# Patient Record
Sex: Male | Born: 1965 | Race: White | Hispanic: No | Marital: Married | Smoking: Former smoker
Health system: Southern US, Community
[De-identification: ages and names within clinical notes are randomized; demographics above are authoritative.]

## PROBLEM LIST (undated history)

## (undated) DIAGNOSIS — N489 Disorder of penis, unspecified: Secondary | ICD-10-CM

## (undated) DIAGNOSIS — J449 Chronic obstructive pulmonary disease, unspecified: Secondary | ICD-10-CM

## (undated) DIAGNOSIS — I10 Essential (primary) hypertension: Secondary | ICD-10-CM

## (undated) DIAGNOSIS — M1990 Unspecified osteoarthritis, unspecified site: Secondary | ICD-10-CM

## (undated) HISTORY — PX: CYST EXCISION: SHX5701

## (undated) HISTORY — PX: HAND SURGERY: SHX662

---

## 2000-07-13 ENCOUNTER — Emergency Department (HOSPITAL_COMMUNITY): Admission: EM | Admit: 2000-07-13 | Discharge: 2000-07-13 | Payer: Self-pay | Admitting: Emergency Medicine

## 2000-11-10 ENCOUNTER — Emergency Department (HOSPITAL_COMMUNITY): Admission: EM | Admit: 2000-11-10 | Discharge: 2000-11-10 | Payer: Self-pay | Admitting: Emergency Medicine

## 2000-11-10 ENCOUNTER — Encounter: Payer: Self-pay | Admitting: Emergency Medicine

## 2000-11-15 ENCOUNTER — Encounter: Admission: RE | Admit: 2000-11-15 | Discharge: 2000-11-15 | Payer: Self-pay | Admitting: Sports Medicine

## 2000-11-15 ENCOUNTER — Encounter: Payer: Self-pay | Admitting: Sports Medicine

## 2001-05-15 ENCOUNTER — Emergency Department (HOSPITAL_COMMUNITY): Admission: EM | Admit: 2001-05-15 | Discharge: 2001-05-15 | Payer: Self-pay | Admitting: Emergency Medicine

## 2001-05-15 ENCOUNTER — Encounter: Payer: Self-pay | Admitting: *Deleted

## 2001-12-23 ENCOUNTER — Emergency Department (HOSPITAL_COMMUNITY): Admission: EM | Admit: 2001-12-23 | Discharge: 2001-12-23 | Payer: Self-pay | Admitting: Emergency Medicine

## 2002-05-19 ENCOUNTER — Emergency Department (HOSPITAL_COMMUNITY): Admission: EM | Admit: 2002-05-19 | Discharge: 2002-05-19 | Payer: Self-pay | Admitting: Emergency Medicine

## 2002-07-28 ENCOUNTER — Encounter: Payer: Self-pay | Admitting: Emergency Medicine

## 2002-07-28 ENCOUNTER — Emergency Department (HOSPITAL_COMMUNITY): Admission: EM | Admit: 2002-07-28 | Discharge: 2002-07-28 | Payer: Self-pay | Admitting: Emergency Medicine

## 2002-11-24 ENCOUNTER — Emergency Department (HOSPITAL_COMMUNITY): Admission: EM | Admit: 2002-11-24 | Discharge: 2002-11-24 | Payer: Self-pay | Admitting: Emergency Medicine

## 2002-11-24 ENCOUNTER — Encounter: Payer: Self-pay | Admitting: Emergency Medicine

## 2002-12-31 ENCOUNTER — Observation Stay (HOSPITAL_COMMUNITY): Admission: RE | Admit: 2002-12-31 | Discharge: 2003-01-01 | Payer: Self-pay | Admitting: Orthopaedic Surgery

## 2002-12-31 HISTORY — PX: ANTERIOR CERVICAL DECOMP/DISCECTOMY FUSION: SHX1161

## 2005-02-26 ENCOUNTER — Ambulatory Visit (HOSPITAL_COMMUNITY): Admission: RE | Admit: 2005-02-26 | Discharge: 2005-02-27 | Payer: Self-pay | Admitting: Orthopaedic Surgery

## 2005-02-26 HISTORY — PX: POSTERIOR FUSION CERVICAL SPINE: SUR628

## 2005-06-05 ENCOUNTER — Emergency Department (HOSPITAL_COMMUNITY): Admission: EM | Admit: 2005-06-05 | Discharge: 2005-06-05 | Payer: Self-pay | Admitting: Emergency Medicine

## 2005-12-14 ENCOUNTER — Emergency Department (HOSPITAL_COMMUNITY): Admission: EM | Admit: 2005-12-14 | Discharge: 2005-12-14 | Payer: Self-pay | Admitting: Family Medicine

## 2007-02-01 ENCOUNTER — Emergency Department (HOSPITAL_COMMUNITY): Admission: EM | Admit: 2007-02-01 | Discharge: 2007-02-01 | Payer: Self-pay | Admitting: Emergency Medicine

## 2007-03-17 ENCOUNTER — Emergency Department (HOSPITAL_COMMUNITY): Admission: EM | Admit: 2007-03-17 | Discharge: 2007-03-17 | Payer: Self-pay | Admitting: Family Medicine

## 2007-05-16 ENCOUNTER — Emergency Department (HOSPITAL_COMMUNITY): Admission: EM | Admit: 2007-05-16 | Discharge: 2007-05-16 | Payer: Self-pay | Admitting: Emergency Medicine

## 2007-08-05 ENCOUNTER — Emergency Department (HOSPITAL_COMMUNITY): Admission: EM | Admit: 2007-08-05 | Discharge: 2007-08-05 | Payer: Self-pay | Admitting: Emergency Medicine

## 2009-02-09 ENCOUNTER — Emergency Department (HOSPITAL_COMMUNITY): Admission: EM | Admit: 2009-02-09 | Discharge: 2009-02-09 | Payer: Self-pay | Admitting: Emergency Medicine

## 2010-03-02 ENCOUNTER — Emergency Department (HOSPITAL_COMMUNITY)
Admission: EM | Admit: 2010-03-02 | Discharge: 2010-03-02 | Payer: Self-pay | Source: Home / Self Care | Admitting: Family Medicine

## 2010-08-27 LAB — POCT I-STAT, CHEM 8
BUN: 17 mg/dL (ref 6–23)
Calcium, Ion: 1.11 mmol/L — ABNORMAL LOW (ref 1.12–1.32)
Chloride: 103 mEq/L (ref 96–112)
Creatinine, Ser: 1 mg/dL (ref 0.4–1.5)
Glucose, Bld: 94 mg/dL (ref 70–99)
HCT: 47 % (ref 39.0–52.0)
Hemoglobin: 16 g/dL (ref 13.0–17.0)
Potassium: 3.7 mEq/L (ref 3.5–5.1)
Sodium: 139 mEq/L (ref 135–145)
TCO2: 26 mmol/L (ref 0–100)

## 2010-08-27 LAB — DIFFERENTIAL
Basophils Absolute: 0 10*3/uL (ref 0.0–0.1)
Basophils Relative: 0 % (ref 0–1)
Eosinophils Absolute: 0.1 10*3/uL (ref 0.0–0.7)
Eosinophils Relative: 1 % (ref 0–5)
Lymphocytes Relative: 11 % — ABNORMAL LOW (ref 12–46)
Lymphs Abs: 0.9 10*3/uL (ref 0.7–4.0)
Monocytes Absolute: 0.7 10*3/uL (ref 0.1–1.0)
Monocytes Relative: 9 % (ref 3–12)
Neutro Abs: 6.3 10*3/uL (ref 1.7–7.7)
Neutrophils Relative %: 79 % — ABNORMAL HIGH (ref 43–77)

## 2010-08-27 LAB — CBC
HCT: 43.5 % (ref 39.0–52.0)
Hemoglobin: 15 g/dL (ref 13.0–17.0)
MCH: 30.7 pg (ref 26.0–34.0)
MCHC: 34.5 g/dL (ref 30.0–36.0)
MCV: 89 fL (ref 78.0–100.0)
Platelets: 252 10*3/uL (ref 150–400)
RBC: 4.89 MIL/uL (ref 4.22–5.81)
RDW: 12.7 % (ref 11.5–15.5)
WBC: 8 10*3/uL (ref 4.0–10.5)

## 2010-09-19 LAB — POCT RAPID STREP A (OFFICE): Streptococcus, Group A Screen (Direct): NEGATIVE

## 2010-10-30 NOTE — Op Note (Signed)
NAME:  HAYS, DUNNIGAN                         ACCOUNT NO.:  [id number]   MEDICAL RECORD NO.:  [id number]                   PATIENT TYPE:  INP   LOCATION:  5005                                 FACILITY:  [facility]   PHYSICIAN:  Mark C. Ophelia Charter, M.D.                 DATE OF BIRTH:  June 03, 1966   DATE OF PROCEDURE:  12/31/2002  DATE OF DISCHARGE:                                 OPERATIVE REPORT   PREOPERATIVE DIAGNOSIS:  Cervical spondylosis with left C5-6 paracentral HNP  and right C6-7 paracentral HNP with cord compression.   PROCEDURE:  C5-6, C6-7 anterior cervical diskectomy and fusion, allograft  cortical ring with anterior Synthes plating.   SURGEON:  Mark C. Ophelia Charter, M.D.   ASSISTANT:  Genene Churn. Denton Meek.   ANESTHESIA:  GOT.   ESTIMATED BLOOD LOSS:  Less than 100 mL.   DRAINS:  One Hemovac in the neck.   DESCRIPTION OF PROCEDURE:  After induction of general anesthesia,  orotracheal intubation, preoperative Ancef prophylaxis, prepping with  Duraprep, the area was squared with towels.  A sterile skin marker was used  in line with skin fold at the appropriate level and Betadine and Vi-drape.  A sterile Mayo stand at the head.  Thyroid sheet and half sheet at the top.  Incision was made starting in the midline and extending to the left using  the correct thyroid cartilage and carotid tubercle for palpation.  Platysma  was elevated above and below.  Due to the plan, C5-6 and C6-7 level, the  omohyoid muscle was split and divided.  Soft tissue was cleaned directly off  the anterior midline and the longus coli muscles were elevated.  Needle  localization of the 4-5 level was performed with the C-arm draped into the  field as it was placed underneath in a reversed rainbow position. The needle  was confirmed at 4-5.  The next level down was 5-6 with anterior spurring.  Diskectomy was performed with a scalpel blade and pituitary.  Cloward  retractors were used to remove tissue in both  gutters.  Operative microscope  was draped and brought in. The posterior longitudinal ligament was taken  down and disk material was removed which was central and to the left side  which was causing significant compression.  The foraminal was enlarged with  1 to 2 mm Kerrisons.  The dura was directly visualized and decompressed.  Some pieces of disk were teased out with the black nerve hook and ball-tip  nerve hook and removed.  Using the round and pear-shaped bur, endplates were  undercut leaving a small lip anteriorly and one posteriorly to prevent  posterior migration of the plug.  Trial sizes were performed and 6 gave an  excellent fit.  A 43 mm Synthes gold plate was selected and checked under  fluoroscopy as well as direct visualization.  Once the procedure had been  repeated at  C6-7 taking down the disk, curetting the gutters with Cloward  curets sequentially up to 0, bringing in the operative microscope, taking  down the posterior longitudinal ligament and teasing disk fragments out of  the right side gutter with decompression and exposure of the dura all the  way across the back, sizers were used and again a 6 mm graft was selected  for the C6-7 level.  A 6 mm graft again was selected.  A single plateholder  pin was placed and fluoroscopy was used to confirm appropriate placement.  Screws were placed top right and bottom left followed by bottom left and top  right and then center two screws were filled using 14 mm screws.  The entire  apparatus checked with spot fluoroscopy pictures and then the locking screws  were inserted.  Care was taken to gently protect the esophagus during screw  placement and during the hand drilling.  Self-retaining Cloward retractors  were removed after irrigation of the wound. A large towel was placed over  the incision and the fluoroscopy slung up for a single AP x-ray of the neck  which showed good position. A Hemovac drain was placed in line with the  skin  incision through a separate stab incision.  The platysma was closed with 3-0  Vicryl, 4-0 Vicryl, and subcuticular closure.  Tincture of Benzoin and Steri-  Strips, Xeroform, 4x4's, tape, and soft collar was applied.  Needle, sponge,  and instrument count correct.                                               Mark C. Ophelia Charter, M.D.    MCY/MEDQ  D:  12/31/2002  T:  01/01/2003  Job:  [id number]

## 2010-10-30 NOTE — Op Note (Signed)
NAMEZACH, Mike Watkins               ACCOUNT NO.:  [id number]   MEDICAL RECORD NO.:  [id number]          PATIENT TYPE:  INP   LOCATION:  2852                         FACILITY:  [facility]   PHYSICIAN:  Mark C. Ophelia Charter, M.D.    DATE OF BIRTH:  07/06/1965   DATE OF PROCEDURE:  02/26/2005  DATE OF DISCHARGE:                                 OPERATIVE REPORT   PREOPERATIVE DIAGNOSES:  1.  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   POSTOPERATIVE DIAGNOSES:  1.  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  2.  Chronic right lateral epicondylitis.   PROCEDURE:  C5 to C7 posterior cervical fusion, iliac crest bone graft  wiring and Symphony allograft.  Right lateral epicondylar injection.   SURGEON:  Mark C. Ophelia Charter, M.D.   ASSISTANT:  Patrick Jupiter, RNFA.   ANESTHESIA:  General anesthesia plus Marcaine local.   ESTIMATED BLOOD LOSS:  200 mL.   DRAINS:  One Hemovac right posterior iliac crest.   BRIEF HISTORY:  A 45 year old male who has had previous cervical fusion on  December 31, 2002, did well for about nine months and then started having  increasing aching pain and flexion/extension x-ray showed some motion at the  upper level with lucency around the screw at C5 and also at C6 vertebral  body with apparent solid fusion at C6-7.  MRI showed no evidence of residual  compression as well as no compression above or below the fusion site. Due to  his persistent symptoms, he was admitted for posterior fusion for the  pseudoarthrosis.   DESCRIPTION OF PROCEDURE:  After induction of general anesthesia, patient  was placed prone.  There were some problems with the CO2 monitor which  requiring replacing some of the sets which delayed positioning prone and  prepping.  Once this was taken care of and he was adjusted on horseshoe head  holder to make sure there was no pressure, he was placed in reverse  Trendelenburg with good blood pressure.  The occiput area of the head about  an inch and  a half was shaved and area was squared with 10/10 drapes after  Tincture of Benzoin as well as right posterior iliac crest.   The area was squared with towels, sterile skin marker used on the neck at  the midline and Betadine Vi-Drape application x2 sterilely administered at  the head and thyroid sheet.  Incision was made at the midline in the neck  from about C4 to C7.  Bifid spinous process identified at C5 and clamp was  placed between C4-5 spinous process and C5-6.  C4-5 showed obvious motion  with Kocher clamp on each spinous process.  C5-6 showed trace motion,  initially was unsure there was motion and then as we continued lateral  exposure, it was obvious that there was a few millimeters of motion.  At the  bottom level surprisingly there was also motion, although it looked solid  and did not move on flexion/extension x-rays.  Motion was less perceptible  at the bottom level.  With cross table lateral x-ray confirming that the  clamps  were between C4-5 an C5-6 spinous process, upper clamp was removed.  The interspinous ligament was removed.  Bone was debrided with a bur out to  the facets.  The 5, 6 and 7 lamina was prepared by removing portions of the  cortical bone to a good bleeding bed without penetrating all the way through  the lamina.  Right iliac crest graft was exposed and a cortical cancellous  piece was fashioned and then made into an H and then stuck in between  spinous process and then pressed down with slight cracking as it layed down  against the inferior aspect of the lamina that had been prepared.  Identical  procedure was repeated at C6-7 level and then graft was harvested with the  mixed with the Symphony with 10 mL iliac crest with blood that had been  drawn preoperatively 300 mL for preparation.  Log was cut in half.  A 24-  gauge wire, triple strand, was twisted into a cable.  A notch was cut at the  top of C5, the edges were smoothed and it was passed at the  base of C7 up  around through the notch at C5, then tightened down with H grafts being held  by the wire.  Log that had been cut in half was packed on the right and left  side making sure that bone went down to the level of the facets laterally  and also stayed between C5 and C7 on the lamina and also over the top of the  H grafts.  Operative field was dry.  Some FloSeal had been used prior to  denuding and some muscle bleeders were coagulated.  Deep fascia in the  midline was closed with 0 Vicryl, 2-0 Vicryl  in the subcutaneous tissue.  Operative field was dry.  No drain was needed in the neck.  In the iliac  crest, there was some bleeding from the bone oozing and FloSeal was squirted  in. Hemovac was placed through separate stab incision with in and out  technique and the deep fascia closed with 0 Vicryl, 2-0 Vicryl subcutaneous  tissue, 3-0 Vicryl subcuticular skin closure, Tincture of Benzoin, Steri-  Strips and Marcaine infiltration.  In the neck, 2-0 Vicryl followed by 4-0  Vicryl subcuticular closure was used.  Tincture of Benzoin and Steri-Strips  and Marcaine infiltration. Postoperative dressing, soft cervical collar.  With patient still prone, the right arm was brought out from the drape,  prepped on the lateral epicondyle and then injected with combination of  cortisone and Marcaine 1 mL of each into the lateral epicondyle into the  tendon insertion site.  This is at least the patient's fourth or fifth  injection over several years for his chronic problem.  The 4x4 was placed  with a piece of tape post injection and then patient was transferred to the  supine position, soft collar was placed, then transferred to the recovery  room in stable condition.  Instrument count and needle count correct.  No  packs were used during the case.      Mark C. Ophelia Charter, M.D.  Electronically Signed    MCY/MEDQ  D:  02/26/2005  T:  02/26/2005  Job:  [id number]

## 2011-03-05 LAB — URINALYSIS, ROUTINE W REFLEX MICROSCOPIC
Bilirubin Urine: NEGATIVE
Glucose, UA: NEGATIVE
Hgb urine dipstick: NEGATIVE
Ketones, ur: NEGATIVE
Nitrite: NEGATIVE
Protein, ur: NEGATIVE
Specific Gravity, Urine: 1.028
Urobilinogen, UA: 0.2
pH: 6

## 2011-03-05 LAB — URINE CULTURE
Colony Count: NO GROWTH
Culture: NO GROWTH

## 2011-03-22 LAB — INFLUENZA A AND B ANTIGEN (CONVERTED LAB)
Inflenza A Ag: NEGATIVE
Influenza B Ag: NEGATIVE

## 2011-03-26 LAB — OCCULT BLOOD X 1 CARD TO LAB, STOOL: Fecal Occult Bld: POSITIVE

## 2011-06-20 ENCOUNTER — Emergency Department (INDEPENDENT_AMBULATORY_CARE_PROVIDER_SITE_OTHER): Payer: BC Managed Care – PPO

## 2011-06-20 ENCOUNTER — Encounter: Payer: Self-pay | Admitting: *Deleted

## 2011-06-20 ENCOUNTER — Emergency Department (INDEPENDENT_AMBULATORY_CARE_PROVIDER_SITE_OTHER)
Admission: EM | Admit: 2011-06-20 | Discharge: 2011-06-20 | Disposition: A | Discharge status: Home / Self Care | Payer: BC Managed Care – PPO | Source: Home / Self Care | Attending: Family Medicine | Admitting: Family Medicine

## 2011-06-20 DIAGNOSIS — S60229A Contusion of unspecified hand, initial encounter: Secondary | ICD-10-CM

## 2011-06-20 DIAGNOSIS — S60221A Contusion of right hand, initial encounter: Secondary | ICD-10-CM

## 2011-06-20 MED ORDER — DICLOFENAC POTASSIUM 50 MG PO TABS: 50.0000 mg | ORAL_TABLET | Freq: Three times a day (TID) | ORAL | Status: AC

## 2011-06-20 NOTE — ED Provider Notes (Signed)
History     CSN: [id number]  Arrival date & time 06/20/11  1249   First MD Initiated Contact with Patient 06/20/11 1306      Chief Complaint  Patient presents with  . Hand Injury    (Consider location/radiation/quality/duration/timing/severity/associated sxs/prior treatment) Patient is a 46 y.o. male presenting with hand pain. The history is provided by the patient.  Hand Pain This is a new problem. The current episode started more than 2 days ago. The problem has not changed since onset.Associated symptoms comments: May have struck against machine sev days ago, but uncertain.. The symptoms are aggravated by bending.    Past Medical History  Diagnosis Date  . Meningitis     Past Surgical History  Procedure Date  . Cervical spine surgery     History reviewed. No pertinent family history.  History  Substance Use Topics  . Smoking status: Former Games developer  . Smokeless tobacco: Not on file  . Alcohol Use: Yes      Review of Systems  Constitutional: Negative.   Musculoskeletal: Positive for joint swelling.  Skin: Negative.   Neurological: Negative.     Allergies  Sulfa antibiotics  Home Medications   Current Outpatient Rx  Name Route Sig Dispense Refill  . IBUPROFEN 800 MG PO TABS Oral Take 800 mg by mouth every 8 (eight) hours as needed.      Marland Kitchen DICLOFENAC POTASSIUM 50 MG PO TABS Oral Take 1 tablet (50 mg total) by mouth 3 (three) times daily. 30 tablet 0    BP 141/88  Pulse 82  Temp(Src) 98.5 F (36.9 C) (Oral)  Resp 18  SpO2 95%  Physical Exam  Nursing note and vitals reviewed. Constitutional: He is oriented to person, place, and time. He appears well-developed and well-nourished.  Musculoskeletal: Normal range of motion. He exhibits tenderness.       Arms: Neurological: He is alert and oriented to person, place, and time.  Skin: Skin is warm and dry.    ED Course  Procedures (including critical care time)  Labs Reviewed - No data to display Dg  Hand Complete Right  06/20/2011  *RADIOLOGY REPORT*  Clinical Data: Right hand pain in the region of the second through fourth metacarpals due to repetitive motion.  RIGHT HAND - COMPLETE 3+ VIEW  Comparison: None.  Findings: Normal appearing bones and soft tissues.  IMPRESSION: Normal examination.  Original Report Authenticated By: Darrol Angel, M.D.     1. Contusion of hand, right       MDM  X-rays reviewed and report per radiologist.         Barkley Bruns, MD 06/20/11 [phone]

## 2011-06-20 NOTE — ED Notes (Signed)
Right hand pain middle finger onset approx 3 to 4 days unknown injury

## 2011-08-03 ENCOUNTER — Encounter (HOSPITAL_COMMUNITY): Payer: Self-pay | Admitting: *Deleted

## 2011-08-03 ENCOUNTER — Emergency Department (INDEPENDENT_AMBULATORY_CARE_PROVIDER_SITE_OTHER)
Admission: EM | Admit: 2011-08-03 | Discharge: 2011-08-03 | Disposition: A | Discharge status: Home Health | Payer: 59 | Source: Home / Self Care | Attending: Emergency Medicine | Admitting: Emergency Medicine

## 2011-08-03 DIAGNOSIS — J111 Influenza due to unidentified influenza virus with other respiratory manifestations: Secondary | ICD-10-CM

## 2011-08-03 DIAGNOSIS — R6889 Other general symptoms and signs: Secondary | ICD-10-CM

## 2011-08-03 MED ORDER — GUAIFENESIN-CODEINE 100-10 MG/5ML PO SYRP: 5.0000 mL | ORAL_SOLUTION | Freq: Three times a day (TID) | ORAL | Status: AC | PRN

## 2011-08-03 MED ORDER — OSELTAMIVIR PHOSPHATE 75 MG PO CAPS: 75.0000 mg | ORAL_CAPSULE | Freq: Two times a day (BID) | ORAL | Status: AC

## 2011-08-03 NOTE — ED Provider Notes (Signed)
History     CSN: [id number]  Arrival date & time 08/03/11  1245   First MD Initiated Contact with Patient 08/03/11 1417      Chief Complaint  Patient presents with  . Generalized Body Aches    (Consider location/radiation/quality/duration/timing/severity/associated sxs/prior treatment) HPI Comments: Patient presents with upper respiratory symptoms consistent of cough congestion runny nose, body aches, headaches for about 3 days. "Feels like I have the flu". 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 male presenting with cough. The history is provided by the patient.  Cough This is a new problem. The current episode started 2 days ago. The problem occurs constantly. The problem has been gradually worsening. The cough is productive of sputum. The maximum temperature recorded prior to his arrival was 100 to 100.9 F. Associated symptoms include chills, sweats, headaches, rhinorrhea, sore throat and myalgias. Pertinent negatives include no shortness of breath and no wheezing. He has tried nothing for the symptoms. The treatment provided no relief. His past medical history does not include bronchitis, pneumonia, COPD or asthma.    Past Medical History  Diagnosis Date  . Meningitis     Past Surgical History  Procedure Date  . Cervical spine surgery     History reviewed. No pertinent family history.  History  Substance Use Topics  . Smoking status: Former Games developer  . Smokeless tobacco: Not on file  . Alcohol Use: Yes      Review of Systems  Constitutional: Positive for fever, chills, appetite change and fatigue.  HENT: Positive for congestion, sore throat and rhinorrhea.   Respiratory: Positive for cough. Negative for shortness of breath and wheezing.   Musculoskeletal: Positive for myalgias.  Skin: Negative for rash.  Neurological: Positive for headaches.    Allergies  Sulfa antibiotics  Home Medications   Current Outpatient Rx    Name Route Sig Dispense Refill  . DICLOFENAC POTASSIUM 50 MG PO TABS Oral Take 1 tablet (50 mg total) by mouth 3 (three) times daily. 30 tablet 0  . GUAIFENESIN-CODEINE 100-10 MG/5ML PO SYRP Oral Take 5 mLs by mouth 3 (three) times daily as needed for cough. 120 mL 0  . IBUPROFEN 800 MG PO TABS Oral Take 800 mg by mouth every 8 (eight) hours as needed.      . OSELTAMIVIR PHOSPHATE 75 MG PO CAPS Oral Take 1 capsule (75 mg total) by mouth every 12 (twelve) hours. 10 capsule 0    BP 145/84  Pulse 80  Temp(Src) 98.1 F (36.7 C) (Oral)  SpO2 97%  Physical Exam  Nursing note and vitals reviewed. Constitutional: He appears well-developed and well-nourished. No distress.  HENT:  Head: Normocephalic.  Right Ear: Tympanic membrane normal.  Left Ear: Tympanic membrane normal.  Mouth/Throat: Uvula is midline and oropharynx is clear and moist.  Eyes: Conjunctivae are normal.  Neck: Normal range of motion. Neck supple. No JVD present.  Cardiovascular: Normal rate.   Pulmonary/Chest: Effort normal and breath sounds normal. No respiratory distress. He has no decreased breath sounds. He has no wheezes. He has no rhonchi. He has no rales. He exhibits no tenderness.  Abdominal: Soft.  Lymphadenopathy:    He has no cervical adenopathy.  Skin: Skin is warm. No rash noted.    ED Course  Procedures (including critical care time)  Labs Reviewed - No data to display No results found.   1. Influenza-like symptoms       MDM  ILI x 3 days with normal exam.        Jimmie Molly, MD 08/03/11 [phone]

## 2011-08-03 NOTE — ED Notes (Signed)
pT  REPORTS  SYMPTOMS  OF  BODY  ACHES   AS  WELL AS  SORETHROAT      COUGH/  CONGESTION      PT  IS  AWAKE  AS  WELL AS  ALERT  SPEAKING IN  COMPLETE  SENTANCES

## 2011-08-06 ENCOUNTER — Telehealth (HOSPITAL_COMMUNITY): Payer: Self-pay | Admitting: *Deleted

## 2011-08-06 NOTE — ED Notes (Signed)
Pt. called @ 1306 from his Mom's house and said he needed a work note extension. I told him I would ask the doctor and call back.  1400 Discussed with Dr. Tressia Danas and she said because it was influenza it could be extended for 2 days ( 5 days total) after that he would need to be rechecked. Work noted done as directed to return 2/24 and put at the front desk. I called and Mom answered she said she would be the one picking up the note. Vassie Moselle 08/06/2011

## 2011-09-24 ENCOUNTER — Emergency Department (INDEPENDENT_AMBULATORY_CARE_PROVIDER_SITE_OTHER)
Admission: EM | Admit: 2011-09-24 | Discharge: 2011-09-24 | Disposition: A | Discharge status: Home / Self Care | Payer: 59 | Source: Home / Self Care | Attending: Family Medicine | Admitting: Family Medicine

## 2011-09-24 ENCOUNTER — Encounter (HOSPITAL_COMMUNITY): Payer: Self-pay

## 2011-09-24 DIAGNOSIS — T148XXA Other injury of unspecified body region, initial encounter: Secondary | ICD-10-CM

## 2011-09-24 DIAGNOSIS — W57XXXA Bitten or stung by nonvenomous insect and other nonvenomous arthropods, initial encounter: Secondary | ICD-10-CM

## 2011-09-24 DIAGNOSIS — T148 Other injury of unspecified body region: Secondary | ICD-10-CM

## 2011-09-24 MED ORDER — CLINDAMYCIN HCL 150 MG PO CAPS: 300.0000 mg | ORAL_CAPSULE | Freq: Four times a day (QID) | ORAL | Status: DC

## 2011-09-24 MED ORDER — CLINDAMYCIN HCL 150 MG PO CAPS: 300.0000 mg | ORAL_CAPSULE | Freq: Four times a day (QID) | ORAL | Status: AC

## 2011-09-24 NOTE — ED Provider Notes (Signed)
Medical screening examination/treatment/procedure(s) were performed by non-physician practitioner and as supervising physician I was immediately available for consultation/collaboration.   [facility]; MD   Sharin Grave, MD 09/24/11 2105

## 2011-09-24 NOTE — Discharge Instructions (Signed)
Insect Bite Mosquitoes, flies, fleas, bedbugs, and many other insects can bite. Insect bites are different from insect stings. A sting is when venom is injected into the skin. Some insect bites can transmit infectious diseases. SYMPTOMS  Insect bites usually turn red, swell, and itch for 2 to 4 days. They often go away on their own. TREATMENT  Your caregiver may prescribe antibiotic medicines if a bacterial infection develops in the bite. HOME CARE INSTRUCTIONS  Do not scratch the bite area.   Keep the bite area clean and dry. Wash the bite area thoroughly with soap and water.   Put ice or cool compresses on the bite area.   Put ice in a plastic bag.   Place a towel between your skin and the bag.   Leave the ice on for 20 minutes, 4 times a day for the first 2 to 3 days, or as directed.   You may apply a baking soda paste, cortisone cream, or calamine lotion to the bite area as directed by your caregiver. This can help reduce itching and swelling.   Only take over-the-counter or prescription medicines as directed by your caregiver.   If you are given antibiotics, take them as directed. Finish them even if you start to feel better.  You may need a tetanus shot if:  You cannot remember when you had your last tetanus shot.   You have never had a tetanus shot.   The injury broke your skin.  If you get a tetanus shot, your arm may swell, get red, and feel warm to the touch. This is common and not a problem. If you need a tetanus shot and you choose not to have one, there is a rare chance of getting tetanus. Sickness from tetanus can be serious. SEEK IMMEDIATE MEDICAL CARE IF:   You have increased pain, redness, or swelling in the bite area.   You see a red line on the skin coming from the bite.   You have a fever.   You have joint pain.   You have a headache or neck pain.   You have unusual weakness.   You have a rash.   You have chest pain or shortness of breath.   You  have abdominal pain, nausea, or vomiting.   You feel unusually tired or sleepy.  MAKE SURE YOU:   Understand these instructions.   Will watch your condition.   Will get help right away if you are not doing well or get worse.  Document Released: 07/08/2004 Document Revised: 05/20/2011 Document Reviewed: 12/30/2010 [employer] Patient Information 2012 [employer]. 

## 2011-09-24 NOTE — ED Notes (Signed)
Pt states he thinks something bit him on Wednesday to rt lower abdomen.  Concerned it was a spider.  States he has also had headache since then and the area is sore to touch.

## 2011-09-24 NOTE — ED Provider Notes (Signed)
History     CSN: [id number]  Arrival date & time 09/24/11  7829   First MD Initiated Contact with Patient 09/24/11 1909      8:04 PM HPI Mike Watkins is a 46 y.o. male complaining of an insect bite on his right lower abdomen. Began 3 days ago. Painful only with palpations. Denies fever, or drainage. Reports no improvements with rest. States area look likes a pimple with surrounding redness. Patient is a 46 y.o. male presenting with abscess. The history is provided by the patient.  Abscess  This is a new problem. Episode onset: 3 days ago after a bug bite. The onset was gradual. The problem occurs continuously. The problem has been gradually worsening. The abscess is present on the abdomen (right abdominal wall). The problem is mild. The abscess is characterized by painfulness. The patient was exposed to an insect bite/sting. The abscess first occurred at home. Pertinent negatives include no fever and no vomiting.    Past Medical History  Diagnosis Date  . Meningitis     Past Surgical History  Procedure Date  . Cervical spine surgery     No family history on file.  History  Substance Use Topics  . Smoking status: Former Games developer  . Smokeless tobacco: Not on file  . Alcohol Use: Yes      Review of Systems  Constitutional: Negative for fever and chills.  Gastrointestinal: Negative for nausea and vomiting.  Skin: Positive for wound.       Insect bite/sting  All other systems reviewed and are negative.    Allergies  Sulfa antibiotics  Home Medications   Current Outpatient Rx  Name Route Sig Dispense Refill  . DICLOFENAC POTASSIUM 50 MG PO TABS Oral Take 1 tablet (50 mg total) by mouth 3 (three) times daily. 30 tablet 0  . IBUPROFEN 800 MG PO TABS Oral Take 800 mg by mouth every 8 (eight) hours as needed.        BP 149/97  Pulse 74  Temp(Src) 98.4 F (36.9 C) (Oral)  Resp 18  SpO2 100%  Physical Exam  Constitutional: He is oriented to person, place, and  time. He appears well-developed and well-nourished.  HENT:  Head: Normocephalic and atraumatic.  Eyes: Pupils are equal, round, and reactive to light.  Neurological: He is alert and oriented to person, place, and time.  Skin: Skin is warm and dry. No rash noted. No erythema. No pallor.       Small papular on right lower abdomen with surrounding erythema. Not indurated or fluctuant. Nontender  Psychiatric: He has a normal mood and affect. His behavior is normal.    ED Course  Procedures   MDM  I was able to gently squeeze the affected area  And extracted a small amount of purulent fluid. Advised warm compresses and will Rx Clindamycin. Pt agrees with plan and is ready for d/c        Mike Lot, PA-C 09/24/11 2049

## 2012-08-11 ENCOUNTER — Other Ambulatory Visit: Payer: Self-pay | Admitting: Urology

## 2012-08-17 ENCOUNTER — Encounter (HOSPITAL_BASED_OUTPATIENT_CLINIC_OR_DEPARTMENT_OTHER): Payer: Self-pay | Admitting: *Deleted

## 2012-08-17 NOTE — Progress Notes (Signed)
NPO AFTER MN. ARRIVES AT 0715. NEEDS HG. 

## 2012-08-22 NOTE — Anesthesia Preprocedure Evaluation (Addendum)
Anesthesia Evaluation  Patient identified by MRN, date of birth, ID band Patient awake    Reviewed: Allergy & Precautions, H&P , NPO status , Patient's Chart, lab work & pertinent test results  Airway Mallampati: II TM Distance: >3 FB Neck ROM: full    Dental no notable dental hx. (+) Teeth Intact and Dental Advisory Given   Pulmonary neg pulmonary ROS,  breath sounds clear to auscultation  Pulmonary exam normal       Cardiovascular Exercise Tolerance: Good negative cardio ROS  Rhythm:regular Rate:Normal     Neuro/Psych negative neurological ROS  negative psych ROS   GI/Hepatic negative GI ROS, Neg liver ROS,   Endo/Other  negative endocrine ROS  Renal/GU negative Renal ROS  negative genitourinary   Musculoskeletal   Abdominal   Peds  Hematology negative hematology ROS (+)   Anesthesia Other Findings   Reproductive/Obstetrics negative OB ROS                           Anesthesia Physical Anesthesia Plan  ASA: I  Anesthesia Plan: MAC   Post-op Pain Management:    Induction:   Airway Management Planned: Simple Face Mask  Additional Equipment:   Intra-op Plan:   Post-operative Plan:   Informed Consent: I have reviewed the patients History and Physical, chart, labs and discussed the procedure including the risks, benefits and alternatives for the proposed anesthesia with the patient or authorized representative who has indicated his/her understanding and acceptance.   Dental Advisory Given  Plan Discussed with: CRNA and Surgeon  Anesthesia Plan Comments:        Anesthesia Quick Evaluation

## 2012-08-23 ENCOUNTER — Encounter (HOSPITAL_BASED_OUTPATIENT_CLINIC_OR_DEPARTMENT_OTHER): Payer: Self-pay | Admitting: Anesthesiology

## 2012-08-23 ENCOUNTER — Encounter (HOSPITAL_BASED_OUTPATIENT_CLINIC_OR_DEPARTMENT_OTHER)
Admission: RE | Disposition: A | Discharge status: Home / Self Care | Payer: Self-pay | Source: Ambulatory Visit | Attending: Urology

## 2012-08-23 ENCOUNTER — Ambulatory Visit (HOSPITAL_BASED_OUTPATIENT_CLINIC_OR_DEPARTMENT_OTHER): Payer: 59 | Admitting: Anesthesiology

## 2012-08-23 ENCOUNTER — Encounter (HOSPITAL_BASED_OUTPATIENT_CLINIC_OR_DEPARTMENT_OTHER): Payer: Self-pay | Admitting: *Deleted

## 2012-08-23 ENCOUNTER — Ambulatory Visit (HOSPITAL_BASED_OUTPATIENT_CLINIC_OR_DEPARTMENT_OTHER)
Admission: RE | Admit: 2012-08-23 | Discharge: 2012-08-23 | Disposition: A | Discharge status: Home / Self Care | Payer: 59 | Source: Ambulatory Visit | Attending: Urology | Admitting: Urology

## 2012-08-23 DIAGNOSIS — N489 Disorder of penis, unspecified: Secondary | ICD-10-CM

## 2012-08-23 DIAGNOSIS — L419 Parapsoriasis, unspecified: Secondary | ICD-10-CM | POA: Insufficient documentation

## 2012-08-23 HISTORY — PX: PENILE BIOPSY: SHX6013

## 2012-08-23 HISTORY — DX: Disorder of penis, unspecified: N48.9

## 2012-08-23 LAB — POCT HEMOGLOBIN-HEMACUE: Hemoglobin: 14.1 g/dL (ref 13.0–17.0)

## 2012-08-23 SURGERY — BIOPSY, PENIS
Anesthesia: General | Site: Penis | Wound class: Clean

## 2012-08-23 MED ORDER — LACTATED RINGERS IV SOLN
INTRAVENOUS | Status: DC
  Administered 2012-08-23: 08:00:00 via INTRAVENOUS
  Filled 2012-08-23: qty 1000

## 2012-08-23 MED ORDER — ACETAMINOPHEN 10 MG/ML IV SOLN
INTRAVENOUS | Status: DC | PRN
  Administered 2012-08-23: 1000 mg via INTRAVENOUS

## 2012-08-23 MED ORDER — DEXAMETHASONE SODIUM PHOSPHATE 4 MG/ML IJ SOLN
INTRAMUSCULAR | Status: DC | PRN
  Administered 2012-08-23: 10 mg via INTRAVENOUS

## 2012-08-23 MED ORDER — LIDOCAINE HCL (CARDIAC) 20 MG/ML IV SOLN
INTRAVENOUS | Status: DC | PRN
  Administered 2012-08-23: 100 mg via INTRAVENOUS

## 2012-08-23 MED ORDER — CEFAZOLIN SODIUM-DEXTROSE 2-3 GM-% IV SOLR
2.0000 g | INTRAVENOUS | Status: AC
  Administered 2012-08-23: 2 g via INTRAVENOUS
  Filled 2012-08-23: qty 50

## 2012-08-23 MED ORDER — LACTATED RINGERS IV SOLN
INTRAVENOUS | Status: DC
  Filled 2012-08-23: qty 1000

## 2012-08-23 MED ORDER — SENNOSIDES-DOCUSATE SODIUM 8.6-50 MG PO TABS: 1.0000 | ORAL_TABLET | Freq: Two times a day (BID) | ORAL | Status: DC

## 2012-08-23 MED ORDER — BUPIVACAINE HCL 0.25 % IJ SOLN
INTRAMUSCULAR | Status: DC | PRN
  Administered 2012-08-23: 16 mL

## 2012-08-23 MED ORDER — FENTANYL CITRATE 0.05 MG/ML IJ SOLN
25.0000 ug | INTRAMUSCULAR | Status: DC | PRN
  Filled 2012-08-23: qty 1

## 2012-08-23 MED ORDER — PROPOFOL INFUSION 10 MG/ML OPTIME
INTRAVENOUS | Status: DC | PRN
  Administered 2012-08-23: 50 ug/kg/min via INTRAVENOUS

## 2012-08-23 MED ORDER — OXYCODONE-ACETAMINOPHEN 5-325 MG PO TABS
1.0000 | ORAL_TABLET | ORAL | Status: DC | PRN
Start: 2012-08-23 — End: 2013-05-23

## 2012-08-23 MED ORDER — BACITRACIN-NEOMYCIN-POLYMYXIN 400-5-5000 EX OINT: TOPICAL_OINTMENT | Freq: Three times a day (TID) | CUTANEOUS | Status: DC

## 2012-08-23 MED ORDER — FENTANYL CITRATE 0.05 MG/ML IJ SOLN
INTRAMUSCULAR | Status: DC | PRN
  Administered 2012-08-23: 100 ug via INTRAVENOUS

## 2012-08-23 MED ORDER — BACITRACIN ZINC 500 UNIT/GM EX OINT
TOPICAL_OINTMENT | CUTANEOUS | Status: DC | PRN
  Administered 2012-08-23: 1 via TOPICAL

## 2012-08-23 MED ORDER — MIDAZOLAM HCL 5 MG/5ML IJ SOLN
INTRAMUSCULAR | Status: DC | PRN
  Administered 2012-08-23: 2 mg via INTRAVENOUS

## 2012-08-23 SURGICAL SUPPLY — 29 items
BLADE SURG 15 STRL LF DISP TIS (BLADE) ×1 IMPLANT
BLADE SURG 15 STRL SS (BLADE) ×2
BLADE SURG ROTATE 9660 (MISCELLANEOUS) ×2 IMPLANT
BNDG COHESIVE 1X5 TAN STRL LF (GAUZE/BANDAGES/DRESSINGS) ×2 IMPLANT
CLOTH BEACON ORANGE TIMEOUT ST (SAFETY) ×2 IMPLANT
COVER MAYO STAND STRL (DRAPES) ×1 IMPLANT
COVER TABLE BACK 60X90 (DRAPES) ×2 IMPLANT
DRAPE PED LAPAROTOMY (DRAPES) ×2 IMPLANT
ELECT REM PT RETURN 9FT ADLT (ELECTROSURGICAL) ×2
ELECTRODE REM PT RTRN 9FT ADLT (ELECTROSURGICAL) ×1 IMPLANT
GAUZE SPONGE 4X4 12PLY STRL LF (GAUZE/BANDAGES/DRESSINGS) ×1 IMPLANT
GAUZE VASELINE 1X8 (GAUZE/BANDAGES/DRESSINGS) ×2 IMPLANT
GLOVE BIO SURGEON STRL SZ 6 (GLOVE) ×1 IMPLANT
GLOVE BIO SURGEON STRL SZ 6.5 (GLOVE) ×1 IMPLANT
GLOVE BIOGEL PI IND STRL 7.5 (GLOVE) ×2 IMPLANT
GLOVE BIOGEL PI INDICATOR 7.5 (GLOVE) ×2
GLOVE ECLIPSE 7.0 STRL STRAW (GLOVE) ×2 IMPLANT
GOWN STRL REIN XL XLG (GOWN DISPOSABLE) ×2 IMPLANT
GOWN SURGICAL LARGE (GOWNS) ×1 IMPLANT
NEEDLE HYPO 22GX1.5 SAFETY (NEEDLE) ×2 IMPLANT
NS IRRIG 500ML POUR BTL (IV SOLUTION) ×1 IMPLANT
PACK BASIN DAY SURGERY FS (CUSTOM PROCEDURE TRAY) ×2 IMPLANT
PENCIL BUTTON HOLSTER BLD 10FT (ELECTRODE) ×2 IMPLANT
SUT CHROMIC 3 0 PS 2 (SUTURE) ×1 IMPLANT
SUT CHROMIC 3 0 SH 27 (SUTURE) ×3 IMPLANT
SUT CHROMIC 4 0 SH 27 (SUTURE) ×1 IMPLANT
SYR CONTROL 10ML LL (SYRINGE) ×1 IMPLANT
TRAY DSU PREP LF (CUSTOM PROCEDURE TRAY) ×2 IMPLANT
WATER STERILE IRR 500ML POUR (IV SOLUTION) IMPLANT

## 2012-08-23 NOTE — H&P (Signed)
Urology History and Physical Exam  CC: Penile lesion  HPI: 47 year old male presents for a penile lesion. This is on the corona of his penis. It is approximately 3 mm in size. It is flat. It is not firm. It is nontender to palpation. He's never noticed this before. Nothing is worse. It is not draining. It is erythematous. He denies any recent sexual contact with new partners. 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 We discussed the risk and benefits of excisional biopsy which include, but are not limited to, heart attack, stroke, penile paresthesia, worsening erectile function, penile disfigurement, need for further surgery, benign lesion, local pain and discomfort, bleeding, infection. Other options include observation to see if the lesion improves.   PMH: Past Medical History  Diagnosis Date  . Penile lesion     PSH: Past Surgical History  Procedure Laterality Date  . Anterior cervical decomp/discectomy fusion  12-31-2002    C5 -- C7  . Posterior fusion cervical spine  02-26-2005    C5 -- C7    Allergies: Allergies  Allergen Reactions  . Sulfa Antibiotics Shortness Of Breath    "HARD TO BREATH"    Medications: No prescriptions prior to admission     Social History: History   Social History  . Marital Status: Married    Spouse Name: N/A    Number of Children: N/A  . Years of Education: N/A   Occupational History  . Not on file.   Social History Main Topics  . Smoking status: Former Smoker -- 3.00 packs/day for 21 years    Types: Cigarettes    Quit date: 08/18/2002  . Smokeless tobacco: Former Neurosurgeon  . Alcohol Use: Yes     Comment: RARE  . Drug Use: No  . Sexually Active: Not on file   Other Topics Concern  . Not on file   Social History Narrative  . No narrative on file    Family History: History reviewed. No pertinent family history.  Review of Systems: Positive: None Negative: Chest pain, SOB, or fever.  A further 10  point review of systems was negative except what is listed in the HPI.  Physical Exam: Filed Vitals:   08/23/12 0800  BP: 135/89  Pulse: 78  Temp: 97.4 F (36.3 C)  Resp: 20    General: No acute distress.  Awake. Head:  Normocephalic.  Atraumatic. ENT:  EOMI.  Mucous membranes moist Neck:  Supple.  No lymphadenopathy. CV:  S1 present. S2 present. Regular rate. Pulmonary: Equal effort bilaterally.  Clear to auscultation bilaterally. Abdomen: Soft.  Non- tender to palpation. Skin:  Normal turgor.  No visible rash. Extremity: No gross deformity of bilateral upper extremities.  No gross deformity of    bilateral lower extremities. Neurologic: Alert. Appropriate mood.  Penis:  Circumcised.  Erythematous lesion on coronal ridge on right lateral side of penis; 3mm in size without drainage. Urethra: No Foley catheter in place.  Orthotopic meatus. Scrotum: No lesions.  No ecchymosis.  No erythema. Lymphatics: No femoral or inguinal adenopathy  Studies:  No results found for this basename: HGB, WBC, PLT,  in the last 72 hours  No results found for this basename: NA, K, CL, CO2, BUN, CREATININE, CALCIUM, MAGNESIUM, GFRNONAA, GFRAA,  in the last 72 hours   No results found for this basename: PT, INR, APTT,  in the last 72 hours   No components found with this basename: ABG,     Assessment:  Penile lesion  Plan: To OR for excision of penile lesion.

## 2012-08-23 NOTE — Transfer of Care (Signed)
Immediate Anesthesia Transfer of Care Note  Patient: Mike Watkins  Procedure(s) Performed: Procedure(s) with comments: EXCISIONAL BIOPSY OF PENILE LESION (N/A) - EXCISIONAL BIOPSY OF PENILE LESION    Patient Location: PACU  Anesthesia Type:MAC  Level of Consciousness: awake, alert  and oriented  Airway & Oxygen Therapy: Patient Spontanous Breathing  Post-op Assessment: Report given to PACU RN  Post vital signs: Reviewed and stable  Complications: No apparent anesthesia complications

## 2012-08-23 NOTE — Anesthesia Postprocedure Evaluation (Signed)
  Anesthesia Post-op Note  Patient: Mike Watkins  Procedure(s) Performed: Procedure(s) (LRB): EXCISIONAL BIOPSY OF PENILE LESION (N/A)  Patient Location: PACU  Anesthesia Type: MAC  Level of Consciousness: awake and alert   Airway and Oxygen Therapy: Patient Spontanous Breathing  Post-op Pain: mild  Post-op Assessment: Post-op Vital signs reviewed, Patient's Cardiovascular Status Stable, Respiratory Function Stable, Patent Airway and No signs of Nausea or vomiting  Last Vitals:  Filed Vitals:   08/23/12 0915  BP: 141/90  Pulse: 86  Temp:   Resp: 17    Post-op Vital Signs: stable   Complications: No apparent anesthesia complications

## 2012-08-23 NOTE — Op Note (Signed)
Urology Operative Report  Date of Procedure: 08/23/12  Surgeon: Natalia Leatherwood, MD Assistant: None  Preoperative Diagnosis: Penile lesion Postoperative Diagnosis:  Same  Procedure(s): Excision of penile lesion (3mm) Penile nerve block  Estimated blood loss: Minimal  Specimen: Sent for pathology  Drains: None  Complications: None  Findings: Penile lesion: 3mm on coronal ridge on the right lateral side. Negative inguinal or femoral adenopathy.  History of present illness: Patient with incidental penile lesion discovered on physical exam.   Procedure in detail: After informed consent was obtained, the patient was taken to the operating room. They were placed in the supine position. SCDs were turned on and in place. IV antibiotics were infused, and IV sedation anesthesia was induced. A timeout was performed in which the correct patient, surgical site, and procedure were identified and agreed upon by the team.  The patient remained in a supine position, making sure to pad all pertinent neurovascular pressure points. Hair was removed from the genitals and they were prepped and draped in the usual sterile fashion.  Dorsal penile nerve block was performed by injecting quarter percent plain Marcaine at the base of the penis at the junction with the body. I withdrew before injecting to ensure no injection into the systemic vasculature. I injected circumferentially around the penis and also at the penile frenulum proximally and distally. I also injected after excision of the lesion around the excision site. A total of 16 cc was injected.  Identified the lesion and marked a margin of 1 mm with a marking pen. I then incised the margins with a 15 blade scalpel. Tenotomy scissors were then used to remove an elliptical piece of tissue to compress the entire lesion. This was sent for permanent pathology. Needle-tipped Bovie electrocautery was used to maintain hemostasis with limited electrocautery  and the bed of the excision site. A 3-0 chromic suture was then placed in a figure-of-eight fashion deep in the resection site. I then closed the skin with interrupted horizontal mattress sutures of 4-0 chromic. This maintained good hemostasis. Bacitracin ointment was placed on the incision and then Vaseline gauze and it was wrapped in koban.  IV sedation was reversed and he was taken to the PACU in a stable condition. Counts were correct at the end of the procedure.

## 2012-08-24 ENCOUNTER — Encounter (HOSPITAL_BASED_OUTPATIENT_CLINIC_OR_DEPARTMENT_OTHER): Payer: Self-pay | Admitting: Urology

## 2012-09-09 ENCOUNTER — Encounter (HOSPITAL_COMMUNITY): Payer: Self-pay | Admitting: Emergency Medicine

## 2012-09-09 ENCOUNTER — Other Ambulatory Visit: Payer: Self-pay

## 2012-09-09 ENCOUNTER — Emergency Department (HOSPITAL_COMMUNITY)
Admission: EM | Admit: 2012-09-09 | Discharge: 2012-09-09 | Disposition: A | Discharge status: Home / Self Care | Payer: 59 | Attending: Emergency Medicine | Admitting: Emergency Medicine

## 2012-09-09 ENCOUNTER — Emergency Department (INDEPENDENT_AMBULATORY_CARE_PROVIDER_SITE_OTHER)
Admission: EM | Admit: 2012-09-09 | Discharge: 2012-09-09 | Disposition: A | Discharge status: Nursing Home (Includes ICF) | Payer: 59 | Source: Home / Self Care | Attending: Family Medicine | Admitting: Family Medicine

## 2012-09-09 ENCOUNTER — Emergency Department (HOSPITAL_COMMUNITY): Payer: 59

## 2012-09-09 DIAGNOSIS — R079 Chest pain, unspecified: Secondary | ICD-10-CM | POA: Insufficient documentation

## 2012-09-09 DIAGNOSIS — S61409A Unspecified open wound of unspecified hand, initial encounter: Secondary | ICD-10-CM

## 2012-09-09 DIAGNOSIS — R0789 Other chest pain: Secondary | ICD-10-CM

## 2012-09-09 DIAGNOSIS — Z87448 Personal history of other diseases of urinary system: Secondary | ICD-10-CM | POA: Insufficient documentation

## 2012-09-09 DIAGNOSIS — R61 Generalized hyperhidrosis: Secondary | ICD-10-CM

## 2012-09-09 DIAGNOSIS — Z23 Encounter for immunization: Secondary | ICD-10-CM

## 2012-09-09 DIAGNOSIS — Z87891 Personal history of nicotine dependence: Secondary | ICD-10-CM | POA: Insufficient documentation

## 2012-09-09 DIAGNOSIS — K219 Gastro-esophageal reflux disease without esophagitis: Secondary | ICD-10-CM | POA: Insufficient documentation

## 2012-09-09 DIAGNOSIS — R1013 Epigastric pain: Secondary | ICD-10-CM | POA: Insufficient documentation

## 2012-09-09 LAB — CBC
HCT: 42.8 % (ref 39.0–52.0)
Hemoglobin: 14.6 g/dL (ref 13.0–17.0)
MCH: 29.5 pg (ref 26.0–34.0)
MCHC: 34.1 g/dL (ref 30.0–36.0)
MCV: 86.5 fL (ref 78.0–100.0)
Platelets: 317 10*3/uL (ref 150–400)
RBC: 4.95 MIL/uL (ref 4.22–5.81)
RDW: 12.9 % (ref 11.5–15.5)
WBC: 12.9 10*3/uL — ABNORMAL HIGH (ref 4.0–10.5)

## 2012-09-09 LAB — POCT I-STAT, CHEM 8
BUN: 24 mg/dL — ABNORMAL HIGH (ref 6–23)
Calcium, Ion: 1.17 mmol/L (ref 1.12–1.23)
Chloride: 106 mEq/L (ref 96–112)
Creatinine, Ser: 1 mg/dL (ref 0.50–1.35)
Glucose, Bld: 97 mg/dL (ref 70–99)
HCT: 45 % (ref 39.0–52.0)
Hemoglobin: 15.3 g/dL (ref 13.0–17.0)
Potassium: 3.7 mEq/L (ref 3.5–5.1)
Sodium: 144 mEq/L (ref 135–145)
TCO2: 30 mmol/L (ref 0–100)

## 2012-09-09 LAB — POCT I-STAT TROPONIN I: Troponin i, poc: 0 ng/mL (ref 0.00–0.08)

## 2012-09-09 MED ORDER — TETANUS-DIPHTH-ACELL PERTUSSIS 5-2.5-18.5 LF-MCG/0.5 IM SUSP
INTRAMUSCULAR | Status: AC
  Filled 2012-09-09: qty 0.5

## 2012-09-09 MED ORDER — OMEPRAZOLE 20 MG PO CPDR: 20.0000 mg | DELAYED_RELEASE_CAPSULE | Freq: Every day | ORAL | Status: DC

## 2012-09-09 MED ORDER — SODIUM CHLORIDE 0.9 % IV SOLN
Freq: Once | INTRAVENOUS | Status: AC
  Administered 2012-09-09: 13:00:00 via INTRAVENOUS

## 2012-09-09 MED ORDER — TETANUS-DIPHTH-ACELL PERTUSSIS 5-2.5-18.5 LF-MCG/0.5 IM SUSP
0.5000 mL | Freq: Once | INTRAMUSCULAR | Status: AC
  Administered 2012-09-09: 0.5 mL via INTRAMUSCULAR

## 2012-09-09 NOTE — ED Notes (Signed)
Patient C/O mild chest pain that began this AM.  Patient went to urgent care to have a cut looked at and mentioned that he had some chest pain. Patient to [facility] ED via EMS.  Patient pain free now.

## 2012-09-09 NOTE — ED Provider Notes (Signed)
History     CSN: [id number]  Arrival date & time 09/09/12  1346   First MD Initiated Contact with Patient 09/09/12 1353      Chief Complaint  Patient presents with  . Chest Pain    (Consider location/radiation/quality/duration/timing/severity/associated sxs/prior treatment) HPI Comments: 47 y/o male with no significant past medical history presents to the ED from [facility] complaining of mid-epigastric abdominal pain radiating to the center/left side of his chest occuring when he woke up at 2:00 this morning. Describes the pain as "burning" lasting only a few minutes. Pain was only present when laying flat, alleviated by sitting up. Denies associated nausea, vomiting, diaphoresis. 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 Last night he ate alligator, dirty rice and green beans. Denies history of GERD. Currently he is pain free. He went to [facility] due to a cut on his hand needing a tetanus shot, and mentioned the symptoms he experienced this morning and was advised to go to the ED from [facility]. EKG at [facility] without any acute findings. His father has a positive history of early heart disease.   Patient is a 47 y.o. male presenting with chest pain. The history is provided by the patient.  Chest Pain Associated symptoms: abdominal pain   Associated symptoms: no back pain, no cough, no dizziness, no fever, no headache, no nausea, no palpitations, no shortness of breath and not vomiting     Past Medical History  Diagnosis Date  . Penile lesion     Past Surgical History  Procedure Laterality Date  . Anterior cervical decomp/discectomy fusion  12-31-2002    C5 -- C7  . Posterior fusion cervical spine  02-26-2005    C5 -- C7  . Penile biopsy N/A 08/23/2012    Procedure: EXCISIONAL BIOPSY OF PENILE LESION;  Surgeon: Milford Cage, MD;  Location: [facility];  Service: Urology;  Laterality: N/A;  EXCISIONAL BIOPSY  OF PENILE LESION      No family history on file.  History  Substance Use Topics  . Smoking status: Former Smoker -- 3.00 packs/day for 21 years    Types: Cigarettes    Quit date: 08/18/2002  . Smokeless tobacco: Former Neurosurgeon  . Alcohol Use: Yes     Comment: RARE      Review of Systems  Constitutional: Negative for fever, chills and appetite change.  Respiratory: Negative for cough and shortness of breath.   Cardiovascular: Positive for chest pain. Negative for palpitations and leg swelling.  Gastrointestinal: Positive for abdominal pain. Negative for nausea, vomiting and diarrhea.  Musculoskeletal: Negative for back pain.  Neurological: Negative for dizziness, light-headedness and headaches.  Psychiatric/Behavioral: Negative for confusion.  All other systems reviewed and are negative.    Allergies  Sulfa antibiotics  Home Medications   Current Outpatient Rx  Name  Route  Sig  Dispense  Refill  . neomycin-bacitracin-polymyxin (NEOSPORIN) ointment   Topical   Apply topically 3 (three) times daily. apply to incision of penis.   15 g   2   . oxyCODONE-acetaminophen (PERCOCET) 5-325 MG per tablet   Oral   Take 1-2 tablets by mouth every 4 (four) hours as needed for pain.   50 tablet   0   . senna-docusate (SENOKOT S) 8.6-50 MG per tablet   Oral   Take 1 tablet by mouth 2 (two) times daily.   60 tablet  0     BP 148/91  Pulse 85  Temp(Src) 98 F (36.7 C) (Oral)  Resp 18  SpO2 94%  Physical Exam  Nursing note and vitals reviewed. Constitutional: He is oriented to person, place, and time. He appears well-developed and well-nourished. No distress.  HENT:  Head: Normocephalic and atraumatic.  Mouth/Throat: Oropharynx is clear and moist.  Eyes: Conjunctivae and EOM are normal. Pupils are equal, round, and reactive to light.  Neck: Normal range of motion. Neck supple.  Cardiovascular: Normal rate, regular rhythm, normal heart sounds and intact distal  pulses.   No extremity edema.  Pulmonary/Chest: Effort normal and breath sounds normal. No respiratory distress. He has no wheezes. He has no rales. He exhibits no tenderness.  Abdominal: Soft. Normal appearance and bowel sounds are normal. He exhibits no distension and no mass. There is no tenderness.  Musculoskeletal: Normal range of motion. He exhibits no edema.  Neurological: He is alert and oriented to person, place, and time.  Skin: Skin is warm and dry. He is not diaphoretic.  Psychiatric: He has a normal mood and affect. His behavior is normal.    ED Course  Procedures (including critical care time)  Labs Reviewed  CBC - Abnormal; Notable for the following:    WBC 12.9 (*)    All other components within normal limits  POCT I-STAT, CHEM 8 - Abnormal; Notable for the following:    BUN 24 (*)    All other components within normal limits  POCT I-STAT TROPONIN I    Date: 09/09/2012  Rate: 89  Rhythm: normal sinus rhythm  QRS Axis: normal  Intervals: normal  ST/T Wave abnormalities: normal  Conduction Disutrbances:none  Narrative Interpretation: no stemi, no changes from EKG obtained at [facility]  Old EKG Reviewed: unchanged   Dg Chest 2 View  09/09/2012  *RADIOLOGY REPORT*  Clinical Data: Chest pain  CHEST - 2 VIEW  Comparison: 03/02/2010  Findings: Stable cardiomegaly without CHF or pneumonia.  No effusion or pneumothorax.  Trachea is midline.  Lower cervical fusion hardware partially imaged.  No significant interval change.  IMPRESSION: Cardiomegaly without CHF or pneumonia   Original Report Authenticated By: Judie Petit. Shick, M.D.      1. GERD (gastroesophageal reflux disease)   2. Chest pain       MDM  47 y/o male with mid-epigastric pain radiating to L side of lower chest lasting a few minutes while laying flat earlier this morning. Symptoms have not returned. Admits to a poor diet. No concern for cardiac origin as symptoms are more likely GI related. Troponin negative. CXR and  EKG without any acute abnormality. He is stable for discharge. Resource guide given for PCP f/u. Return precautions discussed. Patient states understanding of plan and is agreeable. Case discussed with Dr. Hyacinth Meeker who agrees with plan of care.        Trevor Mace, PA-C 09/09/12 1521

## 2012-09-09 NOTE — ED Notes (Signed)
Pt's main concern is his laceration to his left hand w/a razor while working on a truck Also c/o chest pain that woke him up from his sleep Chest pain has resolved; described it as sharp pain that last for "a couple of hours" Denies: SOB, blurry vision, edema Last tetanus was abot 5 yrs ago  He is alert and oriented w/no signs of acute disterss

## 2012-09-09 NOTE — ED Provider Notes (Signed)
History     CSN: [id number]  Arrival date & time 09/09/12  1201   First MD Initiated Contact with Patient 09/09/12 1248      Chief Complaint  Patient presents with  . Extremity Laceration  . Chest Pain    (Consider location/radiation/quality/duration/timing/severity/associated sxs/prior treatment) Patient is a 47 y.o. male presenting with chest pain. The history is provided by the patient.  Chest Pain Pain location:  Epigastric and L chest Pain quality: burning and radiating   Pain radiates to the back: no   Pain severity:  No pain Onset quality:  Sudden (9/10 at onset, never had anything like this before, no etoh, no smoking,no reflux sx.) Duration:  2 hours Timing:  Intermittent Progression:  Resolved (pains relapsed sev times over am hrs, not as bad as initial.) Chronicity:  New Context comment:  Awoke from sleep at 2:30am. 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   Past Medical History  Diagnosis Date  . Penile lesion     Past Surgical History  Procedure Laterality Date  . Anterior cervical decomp/discectomy fusion  12-31-2002    C5 -- C7  . Posterior fusion cervical spine  02-26-2005    C5 -- C7  . Penile biopsy N/A 08/23/2012    Procedure: EXCISIONAL BIOPSY OF PENILE LESION;  Surgeon: Milford Cage, MD;  Location: [facility];  Service: Urology;  Laterality: N/A;  EXCISIONAL BIOPSY OF PENILE LESION      No family history on file.  History  Substance Use Topics  . Smoking status: Former Smoker -- 3.00 packs/day for 21 years    Types: Cigarettes    Quit date: 08/18/2002  . Smokeless tobacco: Former Neurosurgeon  . Alcohol Use: Yes     Comment: RARE      Review of Systems  Constitutional: Positive  for diaphoresis. Negative for fever.  HENT: Negative.   Respiratory: Negative for cough and shortness of breath.   Cardiovascular: Positive for chest pain. Negative for palpitations and leg swelling.  Gastrointestinal: Negative.  Negative for nausea, vomiting and abdominal pain.    Allergies  Sulfa antibiotics  Home Medications   Current Outpatient Rx  Name  Route  Sig  Dispense  Refill  . neomycin-bacitracin-polymyxin (NEOSPORIN) ointment   Topical   Apply topically 3 (three) times daily. apply to incision of penis.   15 g   2   . oxyCODONE-acetaminophen (PERCOCET) 5-325 MG per tablet   Oral   Take 1-2 tablets by mouth every 4 (four) hours as needed for pain.   50 tablet   0   . senna-docusate (SENOKOT S) 8.6-50 MG per tablet   Oral   Take 1 tablet by mouth 2 (two) times daily.   60 tablet   0     BP 136/92  Pulse 93  Temp(Src) 98.2 F (36.8 C) (Oral)  Resp 14  SpO2 96%  Physical Exam  Nursing note and vitals reviewed. Constitutional: He is oriented to person, place, and time. Vital signs are normal. He appears well-developed and well-nourished.  Obese.  HENT:  Mouth/Throat: Oropharynx is clear and moist.  Eyes: Conjunctivae are normal. Pupils are equal, round, and reactive to light.  Neck: Normal range of motion. Neck supple.  Cardiovascular: Normal rate, regular rhythm, normal heart sounds and intact distal pulses.   Pulmonary/Chest: Effort normal and breath sounds normal.  Abdominal: Soft. Bowel sounds are normal. There is no tenderness.  Musculoskeletal:       Hands: Neurological: He is alert and oriented to person, place, and time.  Skin: Skin is warm and dry.    ED Course  Procedures (including critical care time)  Labs Reviewed - No data to display No results found.   1. Chest pain, atypical       MDM  Sent for cp eval, concern for cardiac etiol., no pain at present., ecg-no acute changes.        Linna Hoff, MD 09/09/12 1315

## 2012-09-09 NOTE — ED Notes (Signed)
Patient C/O chest pain that  That began at 330 AM.  Patient states that it was a burning pain and points to his epigastric area as the location of his pain. Patient is pain free at this time.

## 2012-09-10 NOTE — ED Provider Notes (Signed)
Medical screening examination/treatment/procedure(s) were performed by non-physician practitioner and as supervising physician I was immediately available for consultation/collaboration.    Vida Roller, MD 09/10/12 [phone]

## 2012-09-27 ENCOUNTER — Other Ambulatory Visit: Payer: Self-pay | Admitting: Dermatology

## 2013-03-20 ENCOUNTER — Emergency Department (INDEPENDENT_AMBULATORY_CARE_PROVIDER_SITE_OTHER)
Admission: EM | Admit: 2013-03-20 | Discharge: 2013-03-20 | Disposition: A | Discharge status: Home / Self Care | Payer: 59 | Source: Home / Self Care | Attending: Emergency Medicine | Admitting: Emergency Medicine

## 2013-03-20 ENCOUNTER — Encounter (HOSPITAL_COMMUNITY): Payer: Self-pay | Admitting: Emergency Medicine

## 2013-03-20 DIAGNOSIS — R319 Hematuria, unspecified: Secondary | ICD-10-CM

## 2013-03-20 DIAGNOSIS — R300 Dysuria: Secondary | ICD-10-CM

## 2013-03-20 LAB — POCT URINALYSIS DIP (DEVICE)
Bilirubin Urine: NEGATIVE
Glucose, UA: NEGATIVE mg/dL
Ketones, ur: NEGATIVE mg/dL
Leukocytes, UA: NEGATIVE
Nitrite: NEGATIVE
Protein, ur: NEGATIVE mg/dL
Specific Gravity, Urine: >=1.03 (ref 1.005–1.030)
Urobilinogen, UA: 0.2 mg/dL (ref 0.0–1.0)
pH: 6.5 (ref 5.0–8.0)

## 2013-03-20 MED ORDER — PHENAZOPYRIDINE HCL 200 MG PO TABS: 200.0000 mg | ORAL_TABLET | Freq: Three times a day (TID) | ORAL | Status: DC

## 2013-03-20 MED ORDER — CIPROFLOXACIN HCL 500 MG PO TABS: 500.0000 mg | ORAL_TABLET | Freq: Two times a day (BID) | ORAL | Status: DC

## 2013-03-20 NOTE — ED Provider Notes (Signed)
CSN: [id number]     Arrival date & time 03/20/13  1939 History   First MD Initiated Contact with Patient 03/20/13 2004     Chief Complaint  Patient presents with  . Urinary Tract Infection   (Consider location/radiation/quality/duration/timing/severity/associated sxs/prior Treatment) HPI Comments: 47 yo male presents with 2 days of dysuria and hematuria. No previous history of kidney stones. 1 episode of hematuria previously evaluated by Dr. Margarita Grizzle, Urology. Patient D/C tobacco >10 years prior. Denies fever or abdomen/ back pain. No known injuries/ triggers.  Patient is a 47 y.o. male presenting with urinary tract infection.  Urinary Tract Infection    Past Medical History  Diagnosis Date  . Penile lesion    Past Surgical History  Procedure Laterality Date  . Anterior cervical decomp/discectomy fusion  12-31-2002    C5 -- C7  . Posterior fusion cervical spine  02-26-2005    C5 -- C7  . Penile biopsy N/A 08/23/2012    Procedure: EXCISIONAL BIOPSY OF PENILE LESION;  Surgeon: Milford Cage, MD;  Location: [facility];  Service: Urology;  Laterality: N/A;  EXCISIONAL BIOPSY OF PENILE LESION     History reviewed. No pertinent family history. History  Substance Use Topics  . Smoking status: Former Smoker -- 3.00 packs/day for 21 years    Types: Cigarettes    Quit date: 08/18/2002  . Smokeless tobacco: Former Neurosurgeon  . Alcohol Use: Yes     Comment: RARE    Review of Systems  Constitutional: Negative.   Respiratory: Negative.   Cardiovascular: Negative.   Gastrointestinal: Negative.   Genitourinary: Positive for dysuria and hematuria. Negative for frequency, flank pain, discharge, genital sores and penile pain.  Musculoskeletal: Negative.   Skin: Negative.   Psychiatric/Behavioral: Negative.     Allergies  Sulfa antibiotics  Home Medications   Current Outpatient Rx  Name  Route  Sig  Dispense  Refill  . cephALEXin (KEFLEX) 500 MG capsule  Oral   Take 500 mg by mouth 2 (two) times daily.         Marland Kitchen ibuprofen (ADVIL,MOTRIN) 200 MG tablet   Oral   Take 800 mg by mouth daily as needed for pain or headache (patient takes 2 tablets (800mg ) daily only when he has headaches).         . neomycin-bacitracin-polymyxin (NEOSPORIN) ointment   Topical   Apply topically 3 (three) times daily. apply to incision of penis.   15 g   2   . omeprazole (PRILOSEC) 20 MG capsule   Oral   Take 1 capsule (20 mg total) by mouth daily.   30 capsule   0   . oxyCODONE-acetaminophen (PERCOCET) 5-325 MG per tablet   Oral   Take 1-2 tablets by mouth every 4 (four) hours as needed for pain.   50 tablet   0   . senna-docusate (SENOKOT S) 8.6-50 MG per tablet   Oral   Take 1 tablet by mouth 2 (two) times daily.   60 tablet   0    BP 145/91  Pulse 94  Temp(Src) 98 F (36.7 C) (Oral)  Resp 18  SpO2 97% Physical Exam  Nursing note and vitals reviewed. Constitutional: He is oriented to person, place, and time. He appears well-developed and well-nourished.  Neck: Normal range of motion.  Cardiovascular: Normal rate, regular rhythm, normal heart sounds and intact distal pulses.   Pulmonary/Chest: Effort normal and breath sounds normal.  Abdominal: Soft. Bowel sounds are normal.  Musculoskeletal: Normal  range of motion.  Neurological: He is alert and oriented to person, place, and time.  Skin: Skin is warm and dry.  Psychiatric: He has a normal mood and affect. His behavior is normal. Judgment and thought content normal.    ED Course  Procedures (including critical care time) Labs Review Labs Reviewed  POCT URINALYSIS DIP (DEVICE) - Abnormal; Notable for the following:    Hgb urine dipstick SMALL (*)    All other components within normal limits   Imaging Review No results found.  MDM  Hematuria with history of tobacco use in past. Dysuria. Cipro 500mg  1 po BID #10  Pyridium 1 po TID #6 prn pain  ADvised needs PCP to  follow up with in regards to elevated BP and hematuria. Patient will try to establish new PCP. Patient w/c Dr. Margarita Grizzle for re-eval in 2weeks. Advised of need for increase H2O decrease caffeine. ER if symptoms increase    Berenice Primas, PA-C 03/20/13 2032

## 2013-03-20 NOTE — ED Notes (Signed)
C/o UTI

## 2013-03-20 NOTE — ED Provider Notes (Signed)
Medical screening examination/treatment/procedure(s) were performed by non-physician practitioner and as supervising physician I was immediately available for consultation/collaboration.  Leslee Home, M.D.  Reuben Likes, MD 03/20/13 2033

## 2013-05-23 ENCOUNTER — Emergency Department (HOSPITAL_COMMUNITY)
Admission: EM | Admit: 2013-05-23 | Discharge: 2013-05-23 | Disposition: A | Discharge status: Home / Self Care | Payer: 59 | Attending: Emergency Medicine | Admitting: Emergency Medicine

## 2013-05-23 ENCOUNTER — Encounter (HOSPITAL_COMMUNITY): Payer: Self-pay | Admitting: Emergency Medicine

## 2013-05-23 ENCOUNTER — Emergency Department (HOSPITAL_COMMUNITY): Payer: 59

## 2013-05-23 DIAGNOSIS — S93409A Sprain of unspecified ligament of unspecified ankle, initial encounter: Secondary | ICD-10-CM | POA: Insufficient documentation

## 2013-05-23 DIAGNOSIS — Z79899 Other long term (current) drug therapy: Secondary | ICD-10-CM | POA: Insufficient documentation

## 2013-05-23 DIAGNOSIS — X500XXA Overexertion from strenuous movement or load, initial encounter: Secondary | ICD-10-CM | POA: Insufficient documentation

## 2013-05-23 DIAGNOSIS — Z87448 Personal history of other diseases of urinary system: Secondary | ICD-10-CM | POA: Insufficient documentation

## 2013-05-23 DIAGNOSIS — Z87891 Personal history of nicotine dependence: Secondary | ICD-10-CM | POA: Insufficient documentation

## 2013-05-23 DIAGNOSIS — Z792 Long term (current) use of antibiotics: Secondary | ICD-10-CM | POA: Insufficient documentation

## 2013-05-23 DIAGNOSIS — Y990 Civilian activity done for income or pay: Secondary | ICD-10-CM | POA: Insufficient documentation

## 2013-05-23 DIAGNOSIS — S93402A Sprain of unspecified ligament of left ankle, initial encounter: Secondary | ICD-10-CM

## 2013-05-23 DIAGNOSIS — Y9389 Activity, other specified: Secondary | ICD-10-CM | POA: Insufficient documentation

## 2013-05-23 DIAGNOSIS — Y9289 Other specified places as the place of occurrence of the external cause: Secondary | ICD-10-CM | POA: Insufficient documentation

## 2013-05-23 MED ORDER — HYDROCODONE-ACETAMINOPHEN 5-325 MG PO TABS: 2.0000 | ORAL_TABLET | Freq: Four times a day (QID) | ORAL | Status: DC | PRN

## 2013-05-23 NOTE — ED Notes (Signed)
Ortho tech called for application of ASO splint and crutches.

## 2013-05-23 NOTE — ED Provider Notes (Signed)
CSN: [id number]     Arrival date & time 05/23/13  1624 History  This chart was scribed for non-physician practitioner, Roxy Horseman, PA-C,working with Raeford Razor, MD, by Karle Plumber, ED Scribe.  This patient was seen in room WTR5/WTR5 and the patient's care was started at 4:50 PM.  Chief Complaint  Patient presents with  . Ankle Injury   The history is provided by the patient. No language interpreter was used.   HPI Comments:  Mike Watkins is a 47 y.o. male who presents to the Emergency Department complaining of a left ankle injury that occurred at his job several hours ago. He reports his pain as 6/10. He states he stepped out of his truck onto a 2x4 piece of wood and rolled his ankle inward. He denies taking anything for pain PTA. Pt has been able to ambulate since the incident.  Past Medical History  Diagnosis Date  . Penile lesion    Past Surgical History  Procedure Laterality Date  . Anterior cervical decomp/discectomy fusion  12-31-2002    C5 -- C7  . Posterior fusion cervical spine  02-26-2005    C5 -- C7  . Penile biopsy N/A 08/23/2012    Procedure: EXCISIONAL BIOPSY OF PENILE LESION;  Surgeon: Milford Cage, MD;  Location: [facility];  Service: Urology;  Laterality: N/A;  EXCISIONAL BIOPSY OF PENILE LESION     No family history on file. History  Substance Use Topics  . Smoking status: Former Smoker -- 3.00 packs/day for 21 years    Types: Cigarettes    Quit date: 08/18/2002  . Smokeless tobacco: Former Neurosurgeon  . Alcohol Use: Yes     Comment: RARE    Review of Systems A complete 10 system review of systems was obtained and all systems are negative except as noted in the HPI and PMH.   Allergies  Sulfa antibiotics  Home Medications   Current Outpatient Rx  Name  Route  Sig  Dispense  Refill  . cephALEXin (KEFLEX) 500 MG capsule   Oral   Take 500 mg by mouth 2 (two) times daily.         . ciprofloxacin (CIPRO) 500 MG  tablet   Oral   Take 1 tablet (500 mg total) by mouth every 12 (twelve) hours.   10 tablet   0   . ibuprofen (ADVIL,MOTRIN) 200 MG tablet   Oral   Take 800 mg by mouth daily as needed for pain or headache (patient takes 2 tablets (800mg ) daily only when he has headaches).         . neomycin-bacitracin-polymyxin (NEOSPORIN) ointment   Topical   Apply topically 3 (three) times daily. apply to incision of penis.   15 g   2   . omeprazole (PRILOSEC) 20 MG capsule   Oral   Take 1 capsule (20 mg total) by mouth daily.   30 capsule   0   . oxyCODONE-acetaminophen (PERCOCET) 5-325 MG per tablet   Oral   Take 1-2 tablets by mouth every 4 (four) hours as needed for pain.   50 tablet   0   . phenazopyridine (PYRIDIUM) 200 MG tablet   Oral   Take 1 tablet (200 mg total) by mouth 3 (three) times daily.   6 tablet   0   . senna-docusate (SENOKOT S) 8.6-50 MG per tablet   Oral   Take 1 tablet by mouth 2 (two) times daily.   60 tablet  0    Triage Vitals: BP 152/104  Pulse 103  Temp(Src) 97.8 F (36.6 C) (Oral)  Resp 20  SpO2 97% Physical Exam  Nursing note and vitals reviewed. Constitutional: He is oriented to person, place, and time. He appears well-developed and well-nourished.  HENT:  Head: Normocephalic and atraumatic.  Neck: Neck supple.  Cardiovascular: Intact distal pulses.   Brisk capillary refill.  Pulmonary/Chest: Effort normal.  Musculoskeletal: Normal range of motion. He exhibits edema.  Left ankle TTP over the ATFL and CFL, ROM 5/5, strength reduced secondary to pain. No bony tenderness. Mild swelling. No gross abnormality.   Neurological: He is alert and oriented to person, place, and time. No cranial nerve deficit.  Sensation intact.  Psychiatric: He has a normal mood and affect. His behavior is normal.    ED Course  Procedures (including critical care time) DIAGNOSTIC STUDIES: Oxygen Saturation is 97% on RA, normal by my interpretation.    COORDINATION OF CARE: 4:53 PM- Will X-Ray left ankle. Pt verbalizes understanding and agrees to plan.  Medications - No data to display  Labs Review Labs Reviewed - No data to display Imaging Review No results found.  EKG Interpretation   None       MDM   1. Ankle sprain, left, initial encounter    Patient with ankle sprain. Recommend orthopedic followup. Will give the patient an ankle brace, crutches.  I personally performed the services described in this documentation, which was scribed in my presence. The recorded information has been reviewed and is accurate.     Roxy Horseman, PA-C 05/23/13 1909

## 2013-05-23 NOTE — ED Notes (Signed)
Pt states he was getting out of his truck and stepped on a board and twisted his L ankle. Pt has some swelling to lateral side of L ankle. Pt ambulatory to exam room with steady gait.

## 2013-05-26 NOTE — ED Provider Notes (Signed)
Medical screening examination/treatment/procedure(s) were performed by non-physician practitioner and as supervising physician I was immediately available for consultation/collaboration.  EKG Interpretation   None        Gilbert Narain, MD 05/26/13 0751 

## 2014-02-05 ENCOUNTER — Emergency Department (HOSPITAL_COMMUNITY)
Admission: EM | Admit: 2014-02-05 | Discharge: 2014-02-05 | Disposition: A | Discharge status: Home / Self Care | Payer: 59 | Attending: Emergency Medicine | Admitting: Emergency Medicine

## 2014-02-05 ENCOUNTER — Emergency Department (HOSPITAL_COMMUNITY): Payer: 59

## 2014-02-05 ENCOUNTER — Encounter (HOSPITAL_COMMUNITY): Payer: Self-pay | Admitting: Emergency Medicine

## 2014-02-05 DIAGNOSIS — X58XXXA Exposure to other specified factors, initial encounter: Secondary | ICD-10-CM | POA: Insufficient documentation

## 2014-02-05 DIAGNOSIS — S298XXA Other specified injuries of thorax, initial encounter: Secondary | ICD-10-CM | POA: Diagnosis present

## 2014-02-05 DIAGNOSIS — Y9389 Activity, other specified: Secondary | ICD-10-CM | POA: Diagnosis not present

## 2014-02-05 DIAGNOSIS — S29019A Strain of muscle and tendon of unspecified wall of thorax, initial encounter: Secondary | ICD-10-CM

## 2014-02-05 DIAGNOSIS — Z87891 Personal history of nicotine dependence: Secondary | ICD-10-CM | POA: Insufficient documentation

## 2014-02-05 DIAGNOSIS — Y9289 Other specified places as the place of occurrence of the external cause: Secondary | ICD-10-CM | POA: Diagnosis not present

## 2014-02-05 DIAGNOSIS — S2341XA Sprain of ribs, initial encounter: Secondary | ICD-10-CM

## 2014-02-05 DIAGNOSIS — Z87448 Personal history of other diseases of urinary system: Secondary | ICD-10-CM | POA: Diagnosis not present

## 2014-02-05 LAB — URINALYSIS, ROUTINE W REFLEX MICROSCOPIC
Bilirubin Urine: NEGATIVE
Glucose, UA: NEGATIVE mg/dL
Hgb urine dipstick: NEGATIVE
Ketones, ur: NEGATIVE mg/dL
Leukocytes, UA: NEGATIVE
Nitrite: NEGATIVE
Protein, ur: NEGATIVE mg/dL
Specific Gravity, Urine: 1.025 (ref 1.005–1.030)
Urobilinogen, UA: 0.2 mg/dL (ref 0.0–1.0)
pH: 6 (ref 5.0–8.0)

## 2014-02-05 MED ORDER — HYDROCODONE-ACETAMINOPHEN 5-325 MG PO TABS: 2.0000 | ORAL_TABLET | ORAL | Status: DC | PRN

## 2014-02-05 MED ORDER — IBUPROFEN 800 MG PO TABS: 800.0000 mg | ORAL_TABLET | Freq: Three times a day (TID) | ORAL | Status: DC

## 2014-02-05 MED ORDER — IBUPROFEN 800 MG PO TABS
800.0000 mg | ORAL_TABLET | Freq: Once | ORAL | Status: AC
  Administered 2014-02-05: 800 mg via ORAL
  Filled 2014-02-05: qty 1

## 2014-02-05 NOTE — ED Notes (Signed)
Pt c/o left upper quad/rib cage pain that started Sunday while sitting in church. Pt denies any lifting, injury that could cause the pain. Pt states that pain is better when warm rag on it and Ace wrapped tightly.

## 2014-02-05 NOTE — Discharge Instructions (Signed)

## 2014-02-05 NOTE — ED Notes (Signed)
Pt states he is feeling better and is ready to be discharged.

## 2014-02-05 NOTE — ED Provider Notes (Signed)
CSN: [id number]     Arrival date & time 02/05/14  1608 History   First MD Initiated Contact with Patient 02/05/14 1637     Chief Complaint  Patient presents with  . LUQ pain      (Consider location/radiation/quality/duration/timing/severity/associated sxs/prior Treatment) HPI Comments: Patient presents with left sided rib pain onset 3 days ago. Denies any falls or injury. Pain is worse with palpation and movement. States he was playing in the pool with his grandchild but doesn't remember any injury. Denies any shortness of breath, cough or fever. Pain is better with heat and wrapping it with an Ace wrap. Denies any neck or back pain. Denies any abdominal pain, nausea vomiting. Denies any rash. Taking ibuprofen with some relief.  The history is provided by the patient.    Past Medical History  Diagnosis Date  . Penile lesion    Past Surgical History  Procedure Laterality Date  . Anterior cervical decomp/discectomy fusion  12-31-2002    C5 -- C7  . Posterior fusion cervical spine  02-26-2005    C5 -- C7  . Penile biopsy N/A 08/23/2012    Procedure: EXCISIONAL BIOPSY OF PENILE LESION;  Surgeon: Milford Cage, MD;  Location: [facility];  Service: Urology;  Laterality: N/A;  EXCISIONAL BIOPSY OF PENILE LESION     No family history on file. History  Substance Use Topics  . Smoking status: Former Smoker -- 3.00 packs/day for 21 years    Types: Cigarettes    Quit date: 08/18/2002  . Smokeless tobacco: Former Neurosurgeon  . Alcohol Use: Yes     Comment: RARE    Review of Systems  Constitutional: Negative for fever, activity change and appetite change.  Respiratory: Negative for cough, chest tightness and shortness of breath.   Cardiovascular: Negative for chest pain.  Gastrointestinal: Negative for nausea, vomiting and abdominal pain.  Genitourinary: Negative for dysuria and hematuria.  Musculoskeletal: Negative for arthralgias and myalgias.  Skin: Negative for  wound.  Neurological: Negative for dizziness, weakness and headaches.  A complete 10 system review of systems was obtained and all systems are negative except as noted in the HPI and PMH.      Allergies  Sulfa antibiotics  Home Medications   Prior to Admission medications   Medication Sig Start Date End Date Taking? Authorizing Provider  ibuprofen (ADVIL,MOTRIN) 200 MG tablet Take 800 mg by mouth daily as needed for headache or moderate pain.    Yes Historical Provider, MD  HYDROcodone-acetaminophen (NORCO/VICODIN) 5-325 MG per tablet Take 2 tablets by mouth every 4 (four) hours as needed. 02/05/14   Glynn Octave, MD  ibuprofen (ADVIL,MOTRIN) 800 MG tablet Take 1 tablet (800 mg total) by mouth 3 (three) times daily. 02/05/14   Glynn Octave, MD   BP 153/87  Pulse 89  Temp(Src) 98.1 F (36.7 C) (Oral)  Resp 16  SpO2 97% Physical Exam  Nursing note and vitals reviewed. Constitutional: He is oriented to person, place, and time. He appears well-developed and well-nourished. No distress.  HENT:  Head: Normocephalic and atraumatic.  Mouth/Throat: Oropharynx is clear and moist. No oropharyngeal exudate.  Eyes: Conjunctivae and EOM are normal. Pupils are equal, round, and reactive to light.  Neck: Normal range of motion. Neck supple.  No meningismus.  Cardiovascular: Normal rate, regular rhythm, normal heart sounds and intact distal pulses.   No murmur heard. Pulmonary/Chest: Effort normal and breath sounds normal. No respiratory distress. He exhibits tenderness.  TTP L ribs.  No  ecchymosis. No crepitance  Abdominal: Soft. There is no tenderness. There is no rebound and no guarding.  Musculoskeletal: Normal range of motion. He exhibits no edema and no tenderness.  No T or L spine pain  Neurological: He is alert and oriented to person, place, and time. No cranial nerve deficit. He exhibits normal muscle tone. Coordination normal.  No ataxia on finger to nose bilaterally. No  pronator drift. 5/5 strength throughout. CN 2-12 intact. Negative Romberg. Equal grip strength. Sensation intact. Gait is normal.   Skin: Skin is warm.  Psychiatric: He has a normal mood and affect. His behavior is normal.    ED Course  Procedures (including critical care time) Labs Review Labs Reviewed  URINALYSIS, ROUTINE W REFLEX MICROSCOPIC    Imaging Review Dg Ribs Unilateral W/chest Left  02/05/2014   CLINICAL DATA:  Left rib pain, no known injury  EXAM: LEFT RIBS AND CHEST - 3+ VIEW  COMPARISON:  09/09/2012  FINDINGS: Five views left ribs submitted. No acute infiltrate or pulmonary edema. No left rib fracture. No pneumothorax.  IMPRESSION: Negative.   Electronically Signed   By: Natasha Mead M.D.   On: 02/05/2014 17:29     EKG Interpretation   Date/Time:  Tuesday February 05 2014 17:00:49 EDT Ventricular Rate:  82 PR Interval:  147 QRS Duration: 86 QT Interval:  384 QTC Calculation: 448 R Axis:   8 Text Interpretation:  Sinus rhythm No significant change was found  Confirmed by Manus Gunning  MD, Jaqlyn Gruenhagen [phone]) on 02/05/2014 5:07:09 PM      MDM   Final diagnoses:  Sprain and strain of ribs, initial encounter   Left rib pain worse with movement and palpation. No fall or injury. No crepitance. No respiratory distress equal breath sounds  Abdomen is soft and nontender. EKG is normal sinus rhythm. Low suspicion for ACS or PE.  X-rays negative for any rib fracture or pneumothorax. Fast scan negative.  Patient is anxious to leave as he has to go to work.  UA without hematuria.  We'll treat supportively with anti-inflammatories and pain medication. Return precautions discussed. Suspect rib strain. No evidence of fracture or intra-abdominal injury. Vital stable.      EMERGENCY DEPARTMENT [place] FAST EXAM  INDICATIONS:Blunt trauma to the Thorax  PERFORMED BY: Myself  IMAGES ARCHIVED?: Yes  FINDINGS: All views negative and Pericardial effusion absent  LIMITATIONS:   Decompressed bladder  INTERPRETATION:  No abdominal free fluid and No pericardial effusion  COMMENT:      Glynn Octave, MD 02/05/14 2348

## 2014-02-05 NOTE — ED Notes (Addendum)
Pt c/o lower left rib cage pain, described as a sharp stabbing pain, especially with movement.  When patient is still, pain is gone.  Pain started 2 days ago.  Denies any trauma.  Pt states he lifts weights but hasn't in the past couple weeks.  Pt reports playing with his 46 yr old granddaughter in the pool but doesn't recall any strain.  Denies any n/v, diaphoresis, or chest pressure. Pt reports earlier pain worsened with deep breathing, but currently no problems with breathing.  Pt states pain was helped by wrapping torso with ace bandage and heat.  NAD, respirations equal and unlabored, skin warm and dry.

## 2014-03-04 ENCOUNTER — Encounter (HOSPITAL_COMMUNITY): Payer: Self-pay | Admitting: Emergency Medicine

## 2014-03-04 ENCOUNTER — Emergency Department (HOSPITAL_COMMUNITY)
Admission: EM | Admit: 2014-03-04 | Discharge: 2014-03-04 | Disposition: A | Discharge status: Home / Self Care | Payer: 59 | Attending: Emergency Medicine | Admitting: Emergency Medicine

## 2014-03-04 DIAGNOSIS — Z79899 Other long term (current) drug therapy: Secondary | ICD-10-CM | POA: Diagnosis not present

## 2014-03-04 DIAGNOSIS — Z87891 Personal history of nicotine dependence: Secondary | ICD-10-CM | POA: Diagnosis not present

## 2014-03-04 DIAGNOSIS — M5412 Radiculopathy, cervical region: Secondary | ICD-10-CM

## 2014-03-04 DIAGNOSIS — Z87448 Personal history of other diseases of urinary system: Secondary | ICD-10-CM | POA: Insufficient documentation

## 2014-03-04 DIAGNOSIS — Z9889 Other specified postprocedural states: Secondary | ICD-10-CM | POA: Diagnosis not present

## 2014-03-04 DIAGNOSIS — M542 Cervicalgia: Secondary | ICD-10-CM | POA: Diagnosis present

## 2014-03-04 DIAGNOSIS — Z981 Arthrodesis status: Secondary | ICD-10-CM | POA: Insufficient documentation

## 2014-03-04 DIAGNOSIS — G8929 Other chronic pain: Secondary | ICD-10-CM | POA: Insufficient documentation

## 2014-03-04 MED ORDER — HYDROCODONE-ACETAMINOPHEN 5-325 MG PO TABS: 1.0000 | ORAL_TABLET | ORAL | Status: DC | PRN

## 2014-03-04 MED ORDER — CYCLOBENZAPRINE HCL 10 MG PO TABS: 10.0000 mg | ORAL_TABLET | Freq: Two times a day (BID) | ORAL | Status: DC | PRN

## 2014-03-04 NOTE — ED Notes (Signed)
Pt.on monitor pt.dress out in a gown .

## 2014-03-04 NOTE — ED Notes (Signed)
PA at the bedside.

## 2014-03-04 NOTE — Discharge Instructions (Signed)
Return to the ER if you develop any high fever, weakness, numbness, tingling, dizziness, shortness of breath , or if you're unable to move your neck. Followup with your orthopedic specialist.  Cervical Radiculopathy Cervical radiculopathy happens when a nerve in the neck is pinched or bruised by a slipped (herniated) disk or by arthritic changes in the bones of the cervical spine. This can occur due to an injury or as part of the normal aging process. Pressure on the cervical nerves can cause pain or numbness that runs from your neck all the way down into your arm and fingers. CAUSES  There are many possible causes, including:  Injury.  Muscle tightness in the neck from overuse.  Swollen, painful joints (arthritis).  Breakdown or degeneration in the bones and joints of the spine (spondylosis) due to aging.  Bone spurs that may develop near the cervical nerves. SYMPTOMS  Symptoms include pain, weakness, or numbness in the affected arm and hand. Pain can be severe or irritating. Symptoms may be worse when extending or turning the neck. DIAGNOSIS  Your caregiver will ask about your symptoms and do a physical exam. He or she may test your strength and reflexes. X-rays, CT scans, and MRI scans may be needed in cases of injury or if the symptoms do not go away after a period of time. Electromyography (EMG) or nerve conduction testing may be done to study how your nerves and muscles are working. TREATMENT  Your caregiver may recommend certain exercises to help relieve your symptoms. Cervical radiculopathy can, and often does, get better with time and treatment. If your problems continue, treatment options may include:  Wearing a soft collar for short periods of time.  Physical therapy to strengthen the neck muscles.  Medicines, such as nonsteroidal anti-inflammatory drugs (NSAIDs), oral corticosteroids, or spinal injections.  Surgery. Different types of surgery may be done depending on the  cause of your problems. HOME CARE INSTRUCTIONS   Put ice on the affected area.  Put ice in a plastic bag.  Place a towel between your skin and the bag.  Leave the ice on for 15-20 minutes, 03-04 times a day or as directed by your caregiver.  If ice does not help, you can try using heat. Take a warm shower or bath, or use a hot water bottle as directed by your caregiver.  You may try a gentle neck and shoulder massage.  Use a flat pillow when you sleep.  Only take over-the-counter or prescription medicines for pain, discomfort, or fever as directed by your caregiver.  If physical therapy was prescribed, follow your caregiver's directions.  If a soft collar was prescribed, use it as directed. SEEK IMMEDIATE MEDICAL CARE IF:   Your pain gets much worse and cannot be controlled with medicines.  You have weakness or numbness in your hand, arm, face, or leg.  You have a high fever or a stiff, rigid neck.  You lose bowel or bladder control (incontinence).  You have trouble with walking, balance, or speaking. MAKE SURE YOU:   Understand these instructions.  Will watch your condition.  Will get help right away if you are not doing well or get worse. Document Released: 02/23/2001 Document Revised: 08/23/2011 Document Reviewed: 01/12/2011 [facility] Patient Information 2015 [place], [place]. This information is not intended to replace advice given to you by your health care provider. Make sure you discuss any questions you have with your health care provider.

## 2014-03-04 NOTE — ED Notes (Signed)
The pt woke up yesterday with a stiff neck.  The pain is getting worse.  Neck surgery years ago

## 2014-03-04 NOTE — ED Provider Notes (Signed)
CSN: [id number]     Arrival date & time 03/04/14  4098 History   First MD Initiated Contact with Patient 03/04/14 [phone]     Chief Complaint  Patient presents with  . Torticollis     (Consider location/radiation/quality/duration/timing/severity/associated sxs/prior Treatment) HPI Mr. Ramnauth is a 48 year old male with past medical history of C5-C7 fusion surgery, chronic neck pain with increasing neck pain since yesterday morning. Patient states his pain is constant, however acutely became worse yesterday. He states his pain is in his C5 region with radiation into his right shoulder with range of motion of his neck. He denies neck stiffness, fever, headache, sore throat, dizziness, nausea, vomiting, history of cancer, IV drug use, saddle anesthesia, trauma to his neck, urinary/bowel incontinence/retention. Past Medical History  Diagnosis Date  . Penile lesion    Past Surgical History  Procedure Laterality Date  . Anterior cervical decomp/discectomy fusion  12-31-2002    C5 -- C7  . Posterior fusion cervical spine  02-26-2005    C5 -- C7  . Penile biopsy N/A 08/23/2012    Procedure: EXCISIONAL BIOPSY OF PENILE LESION;  Surgeon: Milford Cage, MD;  Location: [facility];  Service: Urology;  Laterality: N/A;  EXCISIONAL BIOPSY OF PENILE LESION     No family history on file. History  Substance Use Topics  . Smoking status: Former Smoker -- 3.00 packs/day for 21 years    Types: Cigarettes    Quit date: 08/18/2002  . Smokeless tobacco: Former Neurosurgeon  . Alcohol Use: Yes     Comment: RARE    Review of Systems  Constitutional: Negative for fever.  HENT: Negative for sore throat, trouble swallowing and voice change.   Respiratory: Negative for shortness of breath.   Gastrointestinal: Negative for nausea, vomiting and abdominal pain.  Genitourinary: Negative for dysuria.  Musculoskeletal: Positive for neck pain and neck stiffness. Negative for back pain and gait  problem.  Neurological: Negative for dizziness, syncope, weakness, light-headedness, numbness and headaches.      Allergies  Sulfa antibiotics  Home Medications   Prior to Admission medications   Medication Sig Start Date End Date Taking? Authorizing Provider  cyclobenzaprine (FLEXERIL) 10 MG tablet Take 10 mg by mouth 3 (three) times daily as needed for muscle spasms.   Yes Historical Provider, MD  ibuprofen (ADVIL,MOTRIN) 200 MG tablet Take 800 mg by mouth daily as needed for headache or moderate pain.    Yes Historical Provider, MD  cyclobenzaprine (FLEXERIL) 10 MG tablet Take 1 tablet (10 mg total) by mouth 2 (two) times daily as needed for muscle spasms. 03/04/14   Monte Fantasia, PA-C  HYDROcodone-acetaminophen (NORCO/VICODIN) 5-325 MG per tablet Take 1-2 tablets by mouth every 4 (four) hours as needed for moderate pain or severe pain. 03/04/14   Monte Fantasia, PA-C   BP 115/98  Pulse 86  Temp(Src) 97.4 F (36.3 C)  Resp 16  Ht  (1.778 m)  Wt 260 lb (117.935 kg)  BMI 37.31 kg/m2  SpO2 95% Physical Exam  Nursing note and vitals reviewed. Constitutional: He appears well-developed and well-nourished. No distress.  HENT:  Head: Normocephalic and atraumatic.  Eyes: EOM are normal. Pupils are equal, round, and reactive to light. Right eye exhibits no discharge. Left eye exhibits no discharge. No scleral icterus.  Neck: Normal range of motion.  Pulmonary/Chest: Effort normal. No respiratory distress.  Musculoskeletal: Normal range of motion.  Patient's pain exacerbated with active range of motion to lateral movement, extension  of the neck. No pain noted with flexion of neck or, chin to chest.  Neurological: He has normal strength. No cranial nerve deficit or sensory deficit. He displays a negative Romberg sign. Coordination and gait normal. GCS eye subscore is 4. GCS verbal subscore is 5. GCS motor subscore is 6.  Patient fully alert answering questions appropriately in  full, clear sentences. Motor strength 5 out of 5 in all major muscle groups of upper and lower extremities. Distal sensation intact.  Skin: Skin is warm and dry. He is not diaphoretic.  Psychiatric: He has a normal mood and affect.    ED Course  Procedures (including critical care time) Labs Review Labs Reviewed - No data to display  Imaging Review No results found.   EKG Interpretation None      MDM   Final diagnoses:  Cervical radiculopathy    48 year old male with past medical history of C5-C7 fusion surgery, chronic neck pain, with flareup of neck pain yesterday morning. Patient states his pain is in his right lateral C. for C5 region with radiation into his shoulder with range of motion of his neck to the right, left. Patient has limited range of motion of extension due to to pain. Patient has normal flexion. No numbness, weakness, fever, history of cancer, IV drug use, trauma to the area. No headache, sore throat, dizziness, blurred vision, dysphagia, shortness of breath. Patient reports having similar pain previously prior to his neck surgeries. History and exam consistent with musculoskeletal neck pain. Patient stating he would like to followup with his orthopedist this morning. We will provide pain control and discharge patient to followup.  Patient with neck pain.  No neurological deficits and normal neuro exam.  Patient has normal range of motion but states is painful.  No loss of bowel or bladder control.  No concern for cauda equina.  No fever, night sweats, weight loss, h/o cancer, IVDU.  RICE protocol and pain medicine indicated and discussed with patient.   BP 115/98  Pulse 86  Temp(Src) 97.4 F (36.3 C)  Resp 16  Ht  (1.778 m)  Wt 260 lb (117.935 kg)  BMI 37.31 kg/m2  SpO2 95%   Signed,  Ladona Mow, PA-C 9:17 AM   This patient discussed with Dr. Pricilla Loveless, MD     Monte Fantasia, PA-C 03/06/14 [phone]

## 2014-03-07 NOTE — ED Provider Notes (Signed)
Medical screening examination/treatment/procedure(s) were performed by non-physician practitioner and as supervising physician I was immediately available for consultation/collaboration.  Audree Camel, MD 03/07/14 [phone]

## 2014-06-05 ENCOUNTER — Emergency Department (HOSPITAL_COMMUNITY)
Admission: EM | Admit: 2014-06-05 | Discharge: 2014-06-05 | Disposition: A | Discharge status: Home / Self Care | Payer: 59 | Attending: Emergency Medicine | Admitting: Emergency Medicine

## 2014-06-05 ENCOUNTER — Encounter (HOSPITAL_COMMUNITY): Payer: Self-pay | Admitting: Emergency Medicine

## 2014-06-05 DIAGNOSIS — S46211A Strain of muscle, fascia and tendon of other parts of biceps, right arm, initial encounter: Secondary | ICD-10-CM | POA: Diagnosis not present

## 2014-06-05 DIAGNOSIS — Y998 Other external cause status: Secondary | ICD-10-CM | POA: Insufficient documentation

## 2014-06-05 DIAGNOSIS — Z87891 Personal history of nicotine dependence: Secondary | ICD-10-CM | POA: Insufficient documentation

## 2014-06-05 DIAGNOSIS — Y9389 Activity, other specified: Secondary | ICD-10-CM | POA: Diagnosis not present

## 2014-06-05 DIAGNOSIS — Z791 Long term (current) use of non-steroidal anti-inflammatories (NSAID): Secondary | ICD-10-CM | POA: Insufficient documentation

## 2014-06-05 DIAGNOSIS — Y9289 Other specified places as the place of occurrence of the external cause: Secondary | ICD-10-CM | POA: Diagnosis not present

## 2014-06-05 DIAGNOSIS — Z87438 Personal history of other diseases of male genital organs: Secondary | ICD-10-CM | POA: Insufficient documentation

## 2014-06-05 DIAGNOSIS — Z79899 Other long term (current) drug therapy: Secondary | ICD-10-CM | POA: Diagnosis not present

## 2014-06-05 DIAGNOSIS — M25521 Pain in right elbow: Secondary | ICD-10-CM | POA: Diagnosis present

## 2014-06-05 DIAGNOSIS — X58XXXA Exposure to other specified factors, initial encounter: Secondary | ICD-10-CM | POA: Diagnosis not present

## 2014-06-05 MED ORDER — DIAZEPAM 5 MG PO TABS: 5.0000 mg | ORAL_TABLET | Freq: Two times a day (BID) | ORAL | Status: DC

## 2014-06-05 MED ORDER — NAPROXEN 500 MG PO TABS: 500.0000 mg | ORAL_TABLET | Freq: Two times a day (BID) | ORAL | Status: DC

## 2014-06-05 NOTE — ED Notes (Signed)
Pt will apply sling at home. Pt is right handed and will be driving home.

## 2014-06-05 NOTE — Discharge Instructions (Signed)
Muscle Strain °A muscle strain is an injury that occurs when a muscle is stretched beyond its normal length. Usually a small number of muscle fibers are torn when this happens. Muscle strain is rated in degrees. First-degree strains have the least amount of muscle fiber tearing and pain. Second-degree and third-degree strains have increasingly more tearing and pain.  °Usually, recovery from muscle strain takes 1-2 weeks. Complete healing takes 5-6 weeks.  °CAUSES  °Muscle strain happens when a sudden, violent force placed on a muscle stretches it too far. This may occur with lifting, sports, or a fall.  °RISK FACTORS °Muscle strain is especially common in athletes.  °SIGNS AND SYMPTOMS °At the site of the muscle strain, there may be: °· Pain. °· Bruising. °· Swelling. °· Difficulty using the muscle due to pain or lack of normal function. °DIAGNOSIS  °Your health care provider will perform a physical exam and ask about your medical history. °TREATMENT  °Often, the best treatment for a muscle strain is resting, icing, and applying cold compresses to the injured area.   °HOME CARE INSTRUCTIONS  °· Use the PRICE method of treatment to promote muscle healing during the first 2-3 days after your injury. The PRICE method involves: °¨ Protecting the muscle from being injured again. °¨ Restricting your activity and resting the injured body part. °¨ Icing your injury. To do this, put ice in a plastic bag. Place a towel between your skin and the bag. Then, apply the ice and leave it on from 15-20 minutes each hour. After the third day, switch to moist heat packs. °¨ Apply compression to the injured area with a splint or elastic bandage. Be careful not to wrap it too tightly. This may interfere with blood circulation or increase swelling. °¨ Elevate the injured body part above the level of your heart as often as you can. °· Only take over-the-counter or prescription medicines for pain, discomfort, or fever as directed by your  health care provider. °· Warming up prior to exercise helps to prevent future muscle strains. °SEEK MEDICAL CARE IF:  °· You have increasing pain or swelling in the injured area. °· You have numbness, tingling, or a significant loss of strength in the injured area. °MAKE SURE YOU:  °· Understand these instructions. °· Will watch your condition. °· Will get help right away if you are not doing well or get worse. °Document Released: 05/31/2005 Document Revised: 03/21/2013 Document Reviewed: 12/28/2012 °ExitCare® Patient Information ©2015 [employer]. This information is not intended to replace advice given to you by your health care provider. Make sure you discuss any questions you have with your health care provider. ° ° °Emergency Department Resource Guide °1) Find a Doctor and Pay Out of Pocket °Although you won't have to find out who is covered by your insurance plan, it is a good idea to ask around and get recommendations. You will then need to call the office and see if the doctor you have chosen will accept you as a new patient and what types of options they offer for patients who are self-pay. Some doctors offer discounts or will set up payment plans for their patients who do not have insurance, but you will need to ask so you aren't surprised when you get to your appointment. ° °2) Contact Your Local Health Department °Not all health departments have doctors that can see patients for sick visits, but many do, so it is worth a call to see if yours does. If you don't   know where your local health department is, you can check in your phone book. The CDC also has a tool to help you locate your state's health department, and many state websites also have listings of all of their local health departments. ° °3) Find a Walk-in Clinic °If your illness is not likely to be very severe or complicated, you may want to try a walk in clinic. These are popping up all over the country in pharmacies, drugstores, and shopping  centers. They're usually staffed by nurse practitioners or physician assistants that have been trained to treat common illnesses and complaints. They're usually fairly quick and inexpensive. However, if you have serious medical issues or chronic medical problems, these are probably not your best option. ° °No Primary Care Doctor: °- Call [facility] at  832-8000 - they can help you locate a primary care doctor that  accepts your insurance, provides certain services, etc. °- Physician Referral Service- 1-800-533-3463 ° °Chronic Pain Problems: °Organization         Address  Phone   Notes  °[facility]  (336) 297-2271 Patients need to be referred by their primary care doctor.  ° °Medication Assistance: °Organization         Address  Phone   Notes  °[place] [facility] [address] °[place], [place] [address] (336) 641-8030 --Must be a resident of [place] °-- Must have NO insurance coverage whatsoever (no Medicaid/ Medicare, etc.) °-- The pt. MUST have a primary care doctor that directs their care regularly and follows them in the community °  °[facility]  (866) 331-1348   °[employer]  (888) 892-1162   ° °Agencies that provide inexpensive medical care: °Organization         Address  Phone   Notes  °[facility]  (336) 832-8035   °[facility]    (336) 832-7272   °[facility] [address] °[place], [place] [address] (336) 832-4777   °[facility] [address], °[place] (336) 271-4999   °[facility]    (336) 373-0678   °[facility]    (336) 272-1050   °[facility] ° [address], [place] Phone:  (336) 832-4444, Fax:  (336) 832-4440 Hours of Operation:  9 am - 6 pm, M-F.  Also accepts Medicaid/Medicare and self-pay.  °[facility] ° [address], [place] Phone: (336) 832-3150, Fax: (336)  832-3151. Hours of Operation:  8:30 am - 5:30 pm, M-F.  Also accepts Medicaid and self-pay.  °[facility] [place] [address], [place] Phone: (336) 878-6027   °[facility] [address], [place], [place] (336)723-1848, Ext. 123 Mondays & Thursdays: 7-9 AM.  First 15 patients are seen on a first come, first serve basis. °  ° °Medicaid-accepting [place] Providers: ° °Organization         Address  Phone   Notes  °[facility] [address], [place] (336) 641-2100 Also accepts self-pay patients.  °[facility] [address], [place] ° (336) 856-9996   °[facility] [address], [place] (336) 288-8857   °[facility] [address] [place] [address], [place] (336) 299-7000   °Veita Bland [address], [place]  ° (336) 373-1557 Only accepts [place] Access   Medicaid patients after they have their name applied to their card.  ° °Self-Pay (no insurance) in [place]: ° °Organization         Address  Phone   Notes  °Sickle Cell Patients, [facility] [address], [place] (336) 832-1970   °[facility] [address], [place] (336) 832-4400   °[facility] ° [address],  (336) 992-4800   °[facility]/Dr. Osei-Bonsu ° [address] [place] [address], [place] or [address], [place] (336) 841-8500 Phone number for both [place] and [place] locations is the same.  °[facility] [address], [place] (336) 299-0000   °[facility] [address] [place] [address], [place] or [address] (336) 852-7530 °(336) 878-2260   °[facility] [address], [place] (336) 350-1642, phone; (336) 294-5005, fax Sees patients 1st and 3rd Saturday of every month.  Must not qualify for public or private insurance (i.e.  Medicaid, Medicare, [place] Health Choice, Veterans' Benefits) • Household income should be no more than 200% of the poverty level •The clinic cannot treat you if you are pregnant or think you are pregnant • Sexually transmitted diseases are not treated at the clinic.  ° ° °Dental Care: °Organization         Address  Phone  Notes  °[facility] [address], [place] (336) 641-6152 Accepts children up to age 21 who are enrolled in Medicaid or [place] Health Choice; pregnant women with a Medicaid card; and children who have applied for Medicaid or [place] Health Choice, but were declined, whose parents can pay a reduced fee at time of service.  °[facility] [place]  [address], [place] (336) 641-7733 Accepts children up to age 21 who are enrolled in Medicaid or [place] Health Choice; pregnant women with a Medicaid card; and children who have applied for Medicaid or [place] Health Choice, but were declined, whose parents can pay a reduced fee at time of service.  °[facility] ° [address],  (336) 641-4533 Patients are seen by appointment only. Walk-ins are not accepted. [facility] will see patients 18 years of age and older. °Monday - Tuesday (8am-5pm) °Most Wednesdays (8:30-5pm) °$30 per visit, cash only  °[facility] ° [address], [place] (336) 641-4533 Patients are seen by appointment only. Walk-ins are not accepted. [facility] will see patients 18 years of age and older. °One Wednesday Evening (Monthly: Volunteer Based).  $30 per visit, cash only  °[facility]  (919) 537-3737 for adults; Children under age 4, call Graduate Pediatric Dentistry at ([phone]) [phone]. Children aged 4-14, please call (919) [phone] to request a pediatric application. ° Dental services are provided in all areas of dental care including fillings,  crowns and bridges, complete and partial dentures, implants, gum treatment, root canals, and extractions. Preventive care is also provided. Treatment is provided to both adults and children. °Patients are selected via a lottery and there is often a waiting list. °  °[facility] [address], ° ° (336) 763-8833 [email or web address] °  °[facility] [address], [place], [place] (336)723-1848, Ext. 123 Second and Fourth Thursday of each month, opens at 6:30 AM; Clinic ends at 9 AM.    Patients are seen on a first-come first-served basis, and a limited number are seen during each clinic.  ° °[facility] ° [address], [place], [place] (336) 723-7904   Eligibility Requirements °You must have lived in [place], [place], or [place] counties for at least the last three months. °  You cannot be eligible for state or federal sponsored healthcare insurance, including Veterans Administration, Medicaid, or Medicare. °  You generally cannot be eligible for healthcare insurance through your employer.  °  How to apply: °Eligibility screenings are held every Tuesday and Wednesday afternoon from 1:00 pm until 4:00 pm. You do not need an appointment for the interview!  °[facility] [address], [place], [place] 336-631-2330   °[facility]  336-342-8273   °[facility]  336-703-3100   °[facility]  336-570-6415   ° °Behavioral Health Resources in the Community: °Intensive Outpatient Programs °Organization         Address  Phone  Notes  °[facility] [address], [place], [place] 336-878-6098   °[facility] [address], [place], [place] 336-832-9800   °[facility] [address], [place], [place] ° 336-882-2125   °[facility] [address],  °[place], [place] 1-800-853-5163 or 336-641-4981   °Substance Abuse  Resources °Organization         Address  Phone  Notes  °[facility]  336-882-2125   °[facility]  336-784-9470   °[facility]  336-285-9073   °[facility]  336-845-3988   °Residential & Outpatient Substance Abuse Program  1-800-659-3381   °Psychological Services °Organization         Address  Phone  Notes  °[facility]  336- 832-9600   °[facility]  336- 378-7881   °[facility] [address], [place] 1-800-853-5163 or 336-641-4981   ° °Mobile Crisis Teams °Organization         Address  Phone  Notes  °[facility], Mobile Crisis Care Unit  1-877-626-1772   °Assertive °[facility] ° [address] [place], [place] 336-834-9664   °Sharon DeEsch [address] °[place] 336-554-5454   ° °Self-Help/Support Groups °Organization         Address  Phone             Notes  °[facility] - variety of support groups  336- 373-1402 Call for more information  °Narcotics Anonymous (NA), [facility] [address], °[place]  2 meetings at this location  ° °Residential Treatment Programs °Organization         Address  Phone  Notes  °[facility] [address],    °[place]  1-866-801-8205   °[facility] ° [address], [place], [place] 704-293-8524   °[facility] [address], [place] 336-845-3988 Admissions: 8am-3pm M-F  °[facility] [address]    °[place], [place] 336-841-1104   °[facility] [address], [place], [place] 336-379-7146   °[facility] [address]  °[place],  336-285-9073   °[facility] Intensive Outpatient [address], [place],  336-852-3033   °[facility] [address]  °[place],  1-877-615-2722 or 336-784-9470   °[facility] [address] [place], [place] 336-227-7417 Accepts Medicaid  °[facility] [address]  ° [place] 1-800-659-3381 Substance Abuse/Addiction Treatment  ° °[place] Behavioral Health Resources °Organization         Address  Phone  Notes  °[facility]  (888) 581-9988   °Julie Brannon, PhD [address] [place], [place]   (336) 349-5553 or (336) 951-0000   °[facility]   [address] °[place], [place] (336) 349-4454   °[facility] [address], [place], [place] (336) 342-8316 Insurance/Medicaid/sponsorship through [facility]  °[facility] [address]                                    [place], [place] (336) 342-8316 Therapy/tele-psych/case  °[facility] [address]  ° [place], [place] (336) 349-2233    °Dr. Arfeen  (336) 349-4544   °[facility] [place]  [employer] [place] [facility] 1) [address], [place] °2) [address], [place] °3)  [address], [place] (336) 349-3220 °(336) 342-7768 ° °(336) 342-8140   °[place] [facility] (336) 342-1394 or (336) 342-3537 (After Hours)    ° ° ° °

## 2014-06-05 NOTE — ED Provider Notes (Signed)
CSN: [id number]     Arrival date & time 06/05/14  1434 History  This chart was scribed for non-physician practitioner, Eben Burowourtney A Forcucci, PA-C, working with Dr. Blinda LeatherwoodPollina, MD by Charline BillsEssence Howell, ED Scribe. This patient was seen in room WTR8/WTR8 and the patient's care was started at 3:00 PM.   Chief Complaint  Patient presents with  . Elbow Pain   The history is provided by the patient. No language interpreter was used.   HPI Comments: Heriberto AntiguaRonald D Maus is a 48 y.o. male who presents to the Emergency Department complaining of R elbow pain onset 2 days ago. He states that he was working out with dumb bells 3 days ago. Pain progresses throughout the day and is exacerbated with extension. No known injury. He denies tingling/numbness, joint swelling, bruising. Pt is R hand dominant. Pt has been treating with a heating pad which provided mild relief. No medications tried PTA. Allergy to Sulfa Abx.  Past Medical History  Diagnosis Date  . Penile lesion    Past Surgical History  Procedure Laterality Date  . Anterior cervical decomp/discectomy fusion  12-31-2002    C5 -- C7  . Posterior fusion cervical spine  02-26-2005    C5 -- C7  . Penile biopsy N/A 08/23/2012    Procedure: EXCISIONAL BIOPSY OF PENILE LESION;  Surgeon: [name], MD;  [facility];  Service: Urology;  Laterality: N/A;  EXCISIONAL BIOPSY OF PENILE LESION     No family history on file. History  Substance Use Topics  . Smoking status: Former Smoker -- 3.00 packs/day for 21 years    Types: Cigarettes    Quit date: 08/18/2002  . Smokeless tobacco: Former NeurosurgeonUser  . Alcohol Use: Yes     Comment: RARE    Review of Systems  Musculoskeletal: Positive for arthralgias. Negative for joint swelling.  Skin: Negative for color change.  Neurological: Negative for numbness.  All other systems reviewed and are negative.  Allergies  Sulfa antibiotics  Home Medications   Prior to Admission  medications   Medication Sig Start Date End Date Taking? Authorizing Provider  cyclobenzaprine (FLEXERIL) 10 MG tablet Take 10 mg by mouth 3 (three) times daily as needed for muscle spasms.    Historical Provider, MD  cyclobenzaprine (FLEXERIL) 10 MG tablet Take 1 tablet (10 mg total) by mouth 2 (two) times daily as needed for muscle spasms. 03/04/14   Monte FantasiaJoseph W Mintz, PA-C  diazepam (VALIUM) 5 MG tablet Take 1 tablet (5 mg total) by mouth 2 (two) times daily. 06/05/14   Blessen Kimbrough A Forcucci, PA-C  HYDROcodone-acetaminophen (NORCO/VICODIN) 5-325 MG per tablet Take 1-2 tablets by mouth every 4 (four) hours as needed for moderate pain or severe pain. 03/04/14   Monte FantasiaJoseph W Mintz, PA-C  ibuprofen (ADVIL,MOTRIN) 200 MG tablet Take 800 mg by mouth daily as needed for headache or moderate pain.     Historical Provider, MD  naproxen (NAPROSYN) 500 MG tablet Take 1 tablet (500 mg total) by mouth 2 (two) times daily. 06/05/14   Fatemah Pourciau A Forcucci, PA-C   Triage Vitals: BP 133/91 mmHg  Pulse 108  Temp(Src) 98 F (36.7 C) (Oral)  Resp 17  SpO2 96% Physical Exam  Constitutional: He is oriented to person, place, and time. He appears well-developed and well-nourished. No distress.  HENT:  Head: Normocephalic and atraumatic.  Nose: Nose normal.  Mouth/Throat: Oropharynx is clear and moist. No oropharyngeal exudate.  Eyes: Conjunctivae and EOM are normal. Pupils are equal,  round, and reactive to light. No scleral icterus.  Neck: Normal range of motion. Neck supple. No JVD present. No thyromegaly present.  Cardiovascular: Normal rate, regular rhythm, normal heart sounds and intact distal pulses.  Exam reveals no gallop and no friction rub.   No murmur heard. Pulses:      Radial pulses are 2+ on the right side, and 2+ on the left side.  Pulmonary/Chest: Effort normal and breath sounds normal. No respiratory distress. He has no wheezes. He has no rhonchi. He has no rales. He exhibits no tenderness.   Musculoskeletal:       Right elbow: He exhibits decreased range of motion. He exhibits no swelling, no effusion, no deformity and no laceration. No tenderness found. No radial head, no medial epicondyle, no lateral epicondyle and no olecranon process tenderness noted.  Lymphadenopathy:    He has no cervical adenopathy.  Neurological: He is alert and oriented to person, place, and time.  Skin: Skin is warm and dry.  Psychiatric: He has a normal mood and affect. His behavior is normal.  Nursing note and vitals reviewed.  ED Course  Procedures (including critical care time) DIAGNOSTIC STUDIES: Oxygen Saturation is 96% on RA, adequate by my interpretation.    COORDINATION OF CARE: 3:06 PM-Discussed treatment plan which includes anti-inflammatories, Flexeril and heating pad with pt at bedside and pt agreed to plan.   Labs Review Labs Reviewed - No data to display  Imaging Review No results found.   EKG Interpretation None      MDM   Final diagnoses:  Biceps strain, right, initial encounter   Patient is a 48 year old male who presents emergency room for evaluation of right elbow pain. Physical exam reveals tenderness over the biceps tendon. There is no obvious deformity. Patient has full range of motion of the joint. There is pain with extension. Given history suspect this likely overuse injury and strain versus spasm. We'll discharge home with naproxen and Valium. Patient stable for discharge at this time.  I personally performed the services described in this documentation, which was scribed in my presence. The recorded information has been reviewed and is accurate.    Eben Burowourtney A Forcucci, PA-C 06/05/14 1516  Gilda Creasehristopher J. Pollina, MD 06/05/14 1729

## 2014-06-05 NOTE — ED Notes (Signed)
48 yo male worked out using dumb bells and experienced pain in the right elbow the next day. Pt is unable to extend at the elbow. No deformity or swelling noted. Pain 0/10 no movement 10/10 w/ movement.

## 2014-06-26 ENCOUNTER — Other Ambulatory Visit: Payer: Self-pay | Admitting: Orthopaedic Surgery

## 2014-06-26 DIAGNOSIS — M542 Cervicalgia: Secondary | ICD-10-CM

## 2014-06-29 ENCOUNTER — Emergency Department (HOSPITAL_COMMUNITY): Payer: 59

## 2014-06-29 ENCOUNTER — Encounter (HOSPITAL_COMMUNITY): Payer: Self-pay | Admitting: *Deleted

## 2014-06-29 ENCOUNTER — Emergency Department (HOSPITAL_COMMUNITY)
Admission: EM | Admit: 2014-06-29 | Discharge: 2014-06-29 | Disposition: A | Discharge status: Home / Self Care | Payer: 59 | Attending: Emergency Medicine | Admitting: Emergency Medicine

## 2014-06-29 DIAGNOSIS — Z79899 Other long term (current) drug therapy: Secondary | ICD-10-CM | POA: Insufficient documentation

## 2014-06-29 DIAGNOSIS — Z87438 Personal history of other diseases of male genital organs: Secondary | ICD-10-CM | POA: Diagnosis not present

## 2014-06-29 DIAGNOSIS — Y998 Other external cause status: Secondary | ICD-10-CM | POA: Diagnosis not present

## 2014-06-29 DIAGNOSIS — S39012A Strain of muscle, fascia and tendon of lower back, initial encounter: Secondary | ICD-10-CM | POA: Diagnosis not present

## 2014-06-29 DIAGNOSIS — M549 Dorsalgia, unspecified: Secondary | ICD-10-CM

## 2014-06-29 DIAGNOSIS — Y9389 Activity, other specified: Secondary | ICD-10-CM | POA: Insufficient documentation

## 2014-06-29 DIAGNOSIS — Y9289 Other specified places as the place of occurrence of the external cause: Secondary | ICD-10-CM | POA: Insufficient documentation

## 2014-06-29 DIAGNOSIS — Z87891 Personal history of nicotine dependence: Secondary | ICD-10-CM | POA: Diagnosis not present

## 2014-06-29 DIAGNOSIS — X58XXXA Exposure to other specified factors, initial encounter: Secondary | ICD-10-CM | POA: Diagnosis not present

## 2014-06-29 DIAGNOSIS — Z791 Long term (current) use of non-steroidal anti-inflammatories (NSAID): Secondary | ICD-10-CM | POA: Diagnosis not present

## 2014-06-29 DIAGNOSIS — S3992XA Unspecified injury of lower back, initial encounter: Secondary | ICD-10-CM | POA: Diagnosis present

## 2014-06-29 MED ORDER — METHOCARBAMOL 500 MG PO TABS: 500.0000 mg | ORAL_TABLET | Freq: Two times a day (BID) | ORAL | Status: DC

## 2014-06-29 MED ORDER — IBUPROFEN 600 MG PO TABS: 600.0000 mg | ORAL_TABLET | Freq: Four times a day (QID) | ORAL | Status: DC | PRN

## 2014-06-29 MED ORDER — OXYCODONE-ACETAMINOPHEN 5-325 MG PO TABS: 1.0000 | ORAL_TABLET | ORAL | Status: DC | PRN

## 2014-06-29 NOTE — ED Provider Notes (Addendum)
CSN: [id number]     Arrival date & time 06/29/14  [id number] History   First MD Initiated Contact with Patient 06/29/14 [phone]     Chief Complaint  Patient presents with  . Back Pain     (Consider location/radiation/quality/duration/timing/severity/associated sxs/prior Treatment) HPI Comments: SUBJECTIVE:  Mike Watkins is a 49 y.o. male who complains of an injury causing low back pain lower back pain starting last night. The pain is positional with bending or lifting, without radiation down the legs. Mechanism of injury: weight lifting and twisting. Symptoms have been acute since that time. Prior history of back problems: no prior back problems. There is no associated numbness, weakness, urinary incontinence, urinary retention, bowel incontinence, weakness.    OBJECTIVE: Vital signs as noted above. Patient appears to be in mild to moderate pain, antalgic gait noted. Lumbosacral spine area reveals no local tenderness or mass. Painful and reduced LS ROM noted. Straight leg raise is negative at 30 degrees on bilateral. DTR's, motor strength and sensation normal, including heel and toe gait.  Peripheral pulses are palpable. Lumbar spine X-Ray: ordered, but results not yet available.   ASSESSMENT:  lumbar strain and without radiculopathy  PLAN: For acute pain, rest, intermittent application of cold packs (later, may switch to heat, but do not sleep on heating pad), analgesics and muscle relaxants are recommended. Discussed longer term treatment plan of prn NSAID's and discussed a home back care exercise program with flexion exercise routine. Proper lifting with avoidance of heavy lifting discussed. Consider Physical Therapy and XRay studies if not improving. Call or return to clinic prn if these symptoms worsen or fail to improve as anticipated.   Patient is a 49 y.o. male presenting with back pain. The history is provided by the patient.  Back Pain Associated symptoms: no abdominal pain, no chest  pain and no numbness     Past Medical History  Diagnosis Date  . Penile lesion    Past Surgical History  Procedure Laterality Date  . Anterior cervical decomp/discectomy fusion  12-31-2002    C5 -- C7  . Posterior fusion cervical spine  02-26-2005    C5 -- C7  . Penile biopsy N/A 08/23/2012    Procedure: EXCISIONAL BIOPSY OF PENILE LESION;  Surgeon: [name], MD;  [facility];  Service: Urology;  Laterality: N/A;  EXCISIONAL BIOPSY OF PENILE LESION     No family history on file. History  Substance Use Topics  . Smoking status: Former Smoker -- 3.00 packs/day for 21 years    Types: Cigarettes    Quit date: 08/18/2002  . Smokeless tobacco: Former NeurosurgeonUser  . Alcohol Use: Yes     Comment: RARE    Review of Systems  Cardiovascular: Negative for chest pain.  Gastrointestinal: Negative for abdominal pain.  Musculoskeletal: Positive for back pain.  Skin: Negative for rash.  Neurological: Negative for numbness.      Allergies  Sulfa antibiotics  Home Medications   Prior to Admission medications   Medication Sig Start Date End Date Taking? Authorizing Provider  diazepam (VALIUM) 5 MG tablet Take 1 tablet (5 mg total) by mouth 2 (two) times daily. 06/05/14  Yes Courtney A Forcucci, PA-C  naproxen (NAPROSYN) 500 MG tablet Take 1 tablet (500 mg total) by mouth 2 (two) times daily. 06/05/14  Yes Courtney A Forcucci, PA-C  cyclobenzaprine (FLEXERIL) 10 MG tablet Take 1 tablet (10 mg total) by mouth 2 (two) times daily as needed for muscle spasms. Patient  not taking: Reported on 06/29/2014 03/04/14   Monte Fantasia, PA-C  HYDROcodone-acetaminophen (NORCO/VICODIN) 5-325 MG per tablet Take 1-2 tablets by mouth every 4 (four) hours as needed for moderate pain or severe pain. Patient not taking: Reported on 06/29/2014 03/04/14   Monte Fantasia, PA-C  ibuprofen (ADVIL,MOTRIN) 600 MG tablet Take 1 tablet (600 mg total) by mouth every 6 (six) hours as  needed. 06/29/14   Derwood Kaplan, MD  methocarbamol (ROBAXIN) 500 MG tablet Take 1 tablet (500 mg total) by mouth 2 (two) times daily. 06/29/14   Derwood Kaplan, MD  oxyCODONE-acetaminophen (PERCOCET/ROXICET) 5-325 MG per tablet Take 1 tablet by mouth every 4 (four) hours as needed for severe pain. 06/29/14   Meghann Landing, MD   BP 149/94 mmHg  Pulse 97  Temp(Src) 97.6 F (36.4 C) (Oral)  Resp 16  SpO2 96% Physical Exam  Constitutional: He appears well-developed.  Neck: Neck supple.  Cardiovascular: Normal rate.   Pulmonary/Chest: Effort normal.  Musculoskeletal:  Pt has tenderness over the lumbar region, upper and mid lumbar, with L sided parapspinal tenderness No step offs, no erythema.  Nursing note and vitals reviewed.   ED Course  Procedures (including critical care time) Labs Review Labs Reviewed - No data to display  Imaging Review Dg Lumbar Spine Complete  06/29/2014   CLINICAL DATA:  Left sided lower back pain today; pt states that he was working out yesterday and while he lifted about 50 lb free weight and turned his lower back side to side, he felt something pulling in his lower back; no previous back injury  EXAM: LUMBAR SPINE - COMPLETE 4+ VIEW  COMPARISON:  None.  FINDINGS: Five non-rib-bearing lumbar vertebrae. Disc space narrowing and mild to moderate spur formation at multiple levels. Facet degenerative changes in the lower lumbar spine. No fractures, pars defects or subluxations.  IMPRESSION: Degenerative changes.  No fracture or subluxation.   Electronically Signed   By: Gordan Payment M.D.   On: 06/29/2014 08:04     EKG Interpretation None      MDM   Final diagnoses:  Back pain  Lumbar strain, initial encounter    Pt comes in with cc of back pain. Injury occurred while he was lifting. He has paraspinal and mid L spine tenderness, heard a pop, no hx of back problems. No neurologic sx. Xrays pending, likely d/c. Xrays to evaluate for hernia/allignement  problems.  Derwood Kaplan, MD 06/29/14 1610  Derwood Kaplan, MD 06/29/14 [phone]

## 2014-06-29 NOTE — Discharge Instructions (Signed)
Please see your doctor for pain control. Read the information on back pain and back exercises.   Back Exercises Back exercises help treat and prevent back injuries. The goal of back exercises is to increase the strength of your abdominal and back muscles and the flexibility of your back. These exercises should be started when you no longer have back pain. Back exercises include:  Pelvic Tilt. Lie on your back with your knees bent. Tilt your pelvis until the lower part of your back is against the floor. Hold this position 5 to 10 sec and repeat 5 to 10 times.  Knee to Chest. Pull first 1 knee up against your chest and hold for 20 to 30 seconds, repeat this with the other knee, and then both knees. This may be done with the other leg straight or bent, whichever feels better.  Sit-Ups or Curl-Ups. Bend your knees 90 degrees. Start with tilting your pelvis, and do a partial, slow sit-up, lifting your trunk only 30 to 45 degrees off the floor. Take at least 2 to 3 seconds for each sit-up. Do not do sit-ups with your knees out straight. If partial sit-ups are difficult, simply do the above but with only tightening your abdominal muscles and holding it as directed.  Hip-Lift. Lie on your back with your knees flexed 90 degrees. Push down with your feet and shoulders as you raise your hips a couple inches off the floor; hold for 10 seconds, repeat 5 to 10 times.  Back arches. Lie on your stomach, propping yourself up on bent elbows. Slowly press on your hands, causing an arch in your low back. Repeat 3 to 5 times. Any initial stiffness and discomfort should lessen with repetition over time.  Shoulder-Lifts. Lie face down with arms beside your body. Keep hips and torso pressed to floor as you slowly lift your head and shoulders off the floor. Do not overdo your exercises, especially in the beginning. Exercises may cause you some mild back discomfort which lasts for a few minutes; however, if the pain is more  severe, or lasts for more than 15 minutes, do not continue exercises until you see your caregiver. Improvement with exercise therapy for back problems is slow.  See your caregivers for assistance with developing a proper back exercise program. Document Released: 07/08/2004 Document Revised: 08/23/2011 Document Reviewed: 04/01/2011 [facility] Patient Information 2015 [place], [place]. This information is not intended to replace advice given to you by your health care provider. Make sure you discuss any questions you have with your health care provider.   Back Injury Prevention The following tips can help you to prevent a back injury. PHYSICAL FITNESS  Exercise often. Try to develop strong stomach (abdominal) muscles.  Do aerobic exercises often. This includes walking, jogging, biking, swimming.  Do exercises that help with balance and strength often. This includes tai chi and yoga.  Stretch before and after you exercise.  Keep a healthy weight. DIET   Ask your doctor how much calcium and vitamin D you need every day.  Include calcium in your diet. Foods high in calcium include dairy products; green, leafy vegetables; and products with calcium added (fortified).  Include vitamin D in your diet. Foods high in vitamin D include milk and products with vitamin D added.  Think about taking a multivitamin or other nutritional products called " supplements."  Stop smoking if you smoke. POSTURE   Sit and stand up straight. Avoid leaning forward or hunching over.  Choose chairs that  support your lower back.  If you work at a desk:  Sit close to your work so you do not lean over.  Keep your chin tucked in.  Keep your neck drawn back.  Keep your elbows bent at a right angle. Your arms should look like the letter "L."  Sit high and close to the steering wheel when you drive. Add low back support to your car seat if needed.  Avoid sitting or standing in one position for too long. Get up  and move around every hour. Take breaks if you are driving for a long time.  Sleep on your side with your knees slightly bent. You can also sleep on your back with a pillow under your knees. Do not sleep on your stomach. LIFTING, TWISTING, AND REACHING  Avoid heavy lifting, especially lifting over and over again. If you must do heavy lifting:  Stretch before lifting.  Work slowly.  Rest between lifts.  Use carts and dollies to move objects when possible.  Make several small trips instead of carrying 1 heavy load.  Ask for help when you need it.  Ask for help when moving big, awkward objects.  Follow these steps when lifting:  Stand with your feet shoulder-width apart.  Get as close to the object as you can. Do not pick up heavy objects that are far from your body.  Use handles or lifting straps when possible.  Bend at your knees. Squat down, but keep your heels off the floor.  Keep your shoulders back, your chin tucked in, and your back straight.  Lift the object slowly. Tighten the muscles in your legs, stomach, and butt. Keep the object as close to the center of your body as possible.  Reverse these directions when you put a load down.  Do not:  Lift the object above your waist.  Twist at the waist while lifting or carrying a load. Move your feet if you need to turn, not your waist.  Bend over without bending at your knees.  Avoid reaching over your head, across a table, or for an object on a high surface. OTHER TIPS  Avoid wet floors and keep sidewalks clear of ice.  Do not sleep on a mattress that is too soft or too hard.  Keep items that you use often within easy reach.  Put heavier objects on shelves at waist level. Put lighter objects on lower or higher shelves.  Find ways to lessen your stress. You can try exercise, massage, or relaxation.  Get help for depression or anxiety if needed. GET HELP IF:  You injure your back.  You have questions about  diet, exercise, or other ways to prevent back injuries. MAKE SURE YOU:  Understand these instructions.  Will watch your condition.  Will get help right away if you are not doing well or get worse. Document Released: 11/17/2007 Document Revised: 08/23/2011 Document Reviewed: 07/12/2011 [facility] Patient Information 2015 [place], [place]. This information is not intended to replace advice given to you by your health care provider. Make sure you discuss any questions you have with your health care provider.  Back Pain, Adult Low back pain is very common. About 1 in 5 people have back pain.The cause of low back pain is rarely dangerous. The pain often gets better over time.About half of people with a sudden onset of back pain feel better in just 2 weeks. About 8 in 10 people feel better by 6 weeks.  CAUSES Some common causes of  back pain include:  Strain of the muscles or ligaments supporting the spine.  Wear and tear (degeneration) of the spinal discs.  Arthritis.  Direct injury to the back. DIAGNOSIS Most of the time, the direct cause of low back pain is not known.However, back pain can be treated effectively even when the exact cause of the pain is unknown.Answering your caregiver's questions about your overall health and symptoms is one of the most accurate ways to make sure the cause of your pain is not dangerous. If your caregiver needs more information, he or she may order lab work or imaging tests (X-rays or MRIs).However, even if imaging tests show changes in your back, this usually does not require surgery. HOME CARE INSTRUCTIONS For many people, back pain returns.Since low back pain is rarely dangerous, it is often a condition that people can learn to [facility] their own.   Remain active. It is stressful on the back to sit or stand in one place. Do not sit, drive, or stand in one place for more than 30 minutes at a time. Take short walks on level surfaces as soon as pain  allows.Try to increase the length of time you walk each day.  Do not stay in bed.Resting more than 1 or 2 days can delay your recovery.  Do not avoid exercise or work.Your body is made to move.It is not dangerous to be active, even though your back may hurt.Your back will likely heal faster if you return to being active before your pain is gone.  Pay attention to your body when you bend and lift. Many people have less discomfortwhen lifting if they bend their knees, keep the load close to their bodies,and avoid twisting. Often, the most comfortable positions are those that put less stress on your recovering back.  Find a comfortable position to sleep. Use a firm mattress and lie on your side with your knees slightly bent. If you lie on your back, put a pillow under your knees.  Only take over-the-counter or prescription medicines as directed by your caregiver. Over-the-counter medicines to reduce pain and inflammation are often the most helpful.Your caregiver may prescribe muscle relaxant drugs.These medicines help dull your pain so you can more quickly return to your normal activities and healthy exercise.  Put ice on the injured area.  Put ice in a plastic bag.  Place a towel between your skin and the bag.  Leave the ice on for 15-20 minutes, 03-04 times a day for the first 2 to 3 days. After that, ice and heat may be alternated to reduce pain and spasms.  Ask your caregiver about trying back exercises and gentle massage. This may be of some benefit.  Avoid feeling anxious or stressed.Stress increases muscle tension and can worsen back pain.It is important to recognize when you are anxious or stressed and learn ways to manage it.Exercise is a great option. SEEK MEDICAL CARE IF:  You have pain that is not relieved with rest or medicine.  You have pain that does not improve in 1 week.  You have new symptoms.  You are generally not feeling well. SEEK IMMEDIATE MEDICAL CARE  IF:   You have pain that radiates from your back into your legs.  You develop new bowel or bladder control problems.  You have unusual weakness or numbness in your arms or legs.  You develop nausea or vomiting.  You develop abdominal pain.  You feel faint. Document Released: 05/31/2005 Document Revised: 11/30/2011 Document Reviewed: 10/02/2013 [employer]  Patient Information 2015 [place]. This information is not intended to replace advice given to you by your health care provider. Make sure you discuss any questions you have with your health care provider.

## 2014-06-29 NOTE — ED Notes (Signed)
Pt states that he began having back pain around 2200 last pm; pt states "I twisted my back"; pt denies numbness or tingling to back or legs; pt denies injury

## 2014-07-13 ENCOUNTER — Ambulatory Visit
Admission: RE | Admit: 2014-07-13 | Discharge: 2014-07-13 | Disposition: A | Discharge status: Home / Self Care | Payer: 59 | Source: Ambulatory Visit | Attending: Orthopaedic Surgery | Admitting: Orthopaedic Surgery

## 2014-07-13 ENCOUNTER — Other Ambulatory Visit: Payer: Self-pay

## 2014-07-13 DIAGNOSIS — M542 Cervicalgia: Secondary | ICD-10-CM

## 2015-01-11 ENCOUNTER — Emergency Department (HOSPITAL_COMMUNITY)
Admission: EM | Admit: 2015-01-11 | Discharge: 2015-01-11 | Disposition: A | Discharge status: Home / Self Care | Payer: 59 | Attending: Emergency Medicine | Admitting: Emergency Medicine

## 2015-01-11 ENCOUNTER — Encounter (HOSPITAL_COMMUNITY): Payer: Self-pay

## 2015-01-11 DIAGNOSIS — R202 Paresthesia of skin: Secondary | ICD-10-CM | POA: Insufficient documentation

## 2015-01-11 DIAGNOSIS — Z87438 Personal history of other diseases of male genital organs: Secondary | ICD-10-CM | POA: Diagnosis not present

## 2015-01-11 DIAGNOSIS — M545 Low back pain, unspecified: Secondary | ICD-10-CM

## 2015-01-11 DIAGNOSIS — Z79899 Other long term (current) drug therapy: Secondary | ICD-10-CM | POA: Diagnosis not present

## 2015-01-11 DIAGNOSIS — Z791 Long term (current) use of non-steroidal anti-inflammatories (NSAID): Secondary | ICD-10-CM | POA: Insufficient documentation

## 2015-01-11 DIAGNOSIS — Z87891 Personal history of nicotine dependence: Secondary | ICD-10-CM | POA: Diagnosis not present

## 2015-01-11 MED ORDER — IBUPROFEN 800 MG PO TABS: 800.0000 mg | ORAL_TABLET | Freq: Four times a day (QID) | ORAL | Status: DC | PRN

## 2015-01-11 MED ORDER — IBUPROFEN 800 MG PO TABS
800.0000 mg | ORAL_TABLET | Freq: Once | ORAL | Status: AC
  Administered 2015-01-11: 800 mg via ORAL
  Filled 2015-01-11: qty 1

## 2015-01-11 NOTE — Discharge Instructions (Signed)

## 2015-01-11 NOTE — ED Notes (Signed)
He c/o non-traumatic left-sided low back pain with "tingling" in left leg since yesterday evening.  He is in no distress and ambulates without difficulty.

## 2015-01-11 NOTE — ED Provider Notes (Signed)
CSN: [id number]     Arrival date & time 01/11/15  1478 History   First MD Initiated Contact with Patient 01/11/15 [phone]     Chief Complaint  Patient presents with  . Back Pain     (Consider location/radiation/quality/duration/timing/severity/associated sxs/prior Treatment) Patient is a 49 y.o. male presenting with back pain.  Back Pain Location:  Lumbar spine Quality: sharp. Radiates to:  Does not radiate Pain severity:  Moderate Pain is:  Same all the time Onset quality:  Sudden Duration:  1 day Timing:  Constant Progression:  Unchanged Chronicity:  New Context comment:  Truck driver Relieved by:  Nothing Worsened by:  Nothing tried Ineffective treatments:  None tried Associated symptoms: paresthesias (over outer left leg)   Risk factors: lack of exercise and obesity     Past Medical History  Diagnosis Date  . Penile lesion    Past Surgical History  Procedure Laterality Date  . Anterior cervical decomp/discectomy fusion  12-31-2002    C5 -- C7  . Posterior fusion cervical spine  02-26-2005    C5 -- C7  . Penile biopsy N/A 08/23/2012    Procedure: EXCISIONAL BIOPSY OF PENILE LESION;  Surgeon: Milford Cage, MD;  [facility]: [facility];  Service: Urology;  Laterality: N/A;  EXCISIONAL BIOPSY OF PENILE LESION     No family history on file. History  Substance Use Topics  . Smoking status: Former Smoker -- 3.00 packs/day for 21 years    Types: Cigarettes    Quit date: 08/18/2002  . Smokeless tobacco: Former Neurosurgeon  . Alcohol Use: Yes     Comment: RARE    Review of Systems  Musculoskeletal: Positive for back pain.  Neurological: Positive for paresthesias (over outer left leg).  All other systems reviewed and are negative.     Allergies  Sulfa antibiotics  Home Medications   Prior to Admission medications   Medication Sig Start Date End Date Taking? Authorizing Provider  cyclobenzaprine (FLEXERIL) 10 MG tablet Take 1 tablet (10 mg  total) by mouth 2 (two) times daily as needed for muscle spasms. Patient not taking: Reported on 06/29/2014 03/04/14   Ladona Mow, PA-C  diazepam (VALIUM) 5 MG tablet Take 1 tablet (5 mg total) by mouth 2 (two) times daily. 06/05/14   Courtney Forcucci, PA-C  HYDROcodone-acetaminophen (NORCO/VICODIN) 5-325 MG per tablet Take 1-2 tablets by mouth every 4 (four) hours as needed for moderate pain or severe pain. Patient not taking: Reported on 06/29/2014 03/04/14   Ladona Mow, PA-C  ibuprofen (ADVIL,MOTRIN) 600 MG tablet Take 1 tablet (600 mg total) by mouth every 6 (six) hours as needed. 06/29/14   Derwood Kaplan, MD  methocarbamol (ROBAXIN) 500 MG tablet Take 1 tablet (500 mg total) by mouth 2 (two) times daily. 06/29/14   Derwood Kaplan, MD  naproxen (NAPROSYN) 500 MG tablet Take 1 tablet (500 mg total) by mouth 2 (two) times daily. 06/05/14   Courtney Forcucci, PA-C  oxyCODONE-acetaminophen (PERCOCET/ROXICET) 5-325 MG per tablet Take 1 tablet by mouth every 4 (four) hours as needed for severe pain. 06/29/14   Ankit Nanavati, MD   BP 142/89 mmHg  Pulse 98  Temp(Src) 97.6 F (36.4 C)  Resp 20  SpO2 95% Physical Exam  Constitutional: He is oriented to person, place, and time. He appears well-developed and well-nourished. No distress.  HENT:  Head: Normocephalic and atraumatic.  Eyes: Conjunctivae are normal.  Neck: Neck supple. No tracheal deviation present.  Cardiovascular: Normal rate and regular rhythm.  Pulmonary/Chest: Effort normal. No respiratory distress.  Abdominal: Soft. He exhibits no distension.  Musculoskeletal:       Lumbar back: He exhibits tenderness. He exhibits normal range of motion and no spasm.       Back:  Neurological: He is alert and oriented to person, place, and time.  Skin: Skin is warm and dry.  Psychiatric: He has a normal mood and affect.    ED Course  Procedures (including critical care time) Labs Review Labs Reviewed - No data to display  Imaging  Review No results found.   EKG Interpretation None      MDM   Final diagnoses:  Left-sided low back pain without sciatica    49 year old male presents with atraumatic left lower back pain that is tender to palpation above the iliac crest, appears to be muscular skeletal in nature. He has noticed a paresthesia on the outer part of his left leg but no saddle anesthesia, no bowel or bladder incontinence, no difficulty ambulating. Was able to perform minor stretching maneuvers in the room without difficulty, will start on a high-dose NSAID for short-term therapy and recommended early ambulation and exercise for definitive therapy.  Lyndal Pulley, MD 01/11/15 [phone]

## 2015-03-01 ENCOUNTER — Emergency Department (HOSPITAL_COMMUNITY)
Admission: EM | Admit: 2015-03-01 | Discharge: 2015-03-01 | Disposition: A | Discharge status: Home / Self Care | Payer: 59 | Attending: Emergency Medicine | Admitting: Emergency Medicine

## 2015-03-01 ENCOUNTER — Encounter (HOSPITAL_COMMUNITY): Payer: Self-pay | Admitting: Emergency Medicine

## 2015-03-01 ENCOUNTER — Emergency Department (HOSPITAL_COMMUNITY): Payer: 59

## 2015-03-01 DIAGNOSIS — Y998 Other external cause status: Secondary | ICD-10-CM | POA: Insufficient documentation

## 2015-03-01 DIAGNOSIS — Z87438 Personal history of other diseases of male genital organs: Secondary | ICD-10-CM | POA: Insufficient documentation

## 2015-03-01 DIAGNOSIS — Z79899 Other long term (current) drug therapy: Secondary | ICD-10-CM | POA: Insufficient documentation

## 2015-03-01 DIAGNOSIS — X58XXXA Exposure to other specified factors, initial encounter: Secondary | ICD-10-CM | POA: Insufficient documentation

## 2015-03-01 DIAGNOSIS — Z791 Long term (current) use of non-steroidal anti-inflammatories (NSAID): Secondary | ICD-10-CM | POA: Insufficient documentation

## 2015-03-01 DIAGNOSIS — Y9389 Activity, other specified: Secondary | ICD-10-CM | POA: Insufficient documentation

## 2015-03-01 DIAGNOSIS — Y9289 Other specified places as the place of occurrence of the external cause: Secondary | ICD-10-CM | POA: Diagnosis not present

## 2015-03-01 DIAGNOSIS — M25561 Pain in right knee: Secondary | ICD-10-CM

## 2015-03-01 DIAGNOSIS — S8991XA Unspecified injury of right lower leg, initial encounter: Secondary | ICD-10-CM | POA: Diagnosis present

## 2015-03-01 DIAGNOSIS — Z87891 Personal history of nicotine dependence: Secondary | ICD-10-CM | POA: Diagnosis not present

## 2015-03-01 MED ORDER — TRAMADOL HCL 50 MG PO TABS: 50.0000 mg | ORAL_TABLET | Freq: Four times a day (QID) | ORAL | Status: DC | PRN

## 2015-03-01 NOTE — Discharge Instructions (Signed)
Please make sure to follow-up with your orthopedic doctors Believe you may have a ligamentous tear. Best treatment at this time is to immobilize knee, ice, and rest. Take pain medications as needed.   Knee Pain Knee pain can be a result of an injury or other medical conditions. Treatment will depend on the cause of your pain. HOME CARE  Only take medicine as told by your doctor.  Keep a healthy weight. Being overweight can make the knee hurt more.  Stretch before exercising or playing sports.  If there is constant knee pain, change the way you exercise. Ask your doctor for advice.  Make sure shoes fit well. Choose the right shoe for the sport or activity.  Protect your knees. Wear kneepads if needed.  Rest when you are tired. GET HELP RIGHT AWAY IF:   Your knee pain does not stop.  Your knee pain does not get better.  Your knee joint feels hot to the touch.  You have a fever. MAKE SURE YOU:   Understand these instructions.  Will watch this condition.  Will get help right away if you are not doing well or get worse. Document Released: 08/27/2008 Document Revised: 08/23/2011 Document Reviewed: 08/27/2008 [facility] Patient Information 2015 [place], [place]. This information is not intended to replace advice given to you by your health care provider. Make sure you discuss any questions you have with your health care provider.

## 2015-03-01 NOTE — ED Provider Notes (Signed)
CSN: [id number]     Arrival date & time 03/01/15  0805 History   First MD Initiated Contact with Patient 03/01/15 0815     Chief Complaint  Patient presents with  . Knee Pain    HPI Comments: Patient states that he was trying to grab is cat yesterday and jerked around. When he jerked around he twisted on his knee. He heard and felt a pop. He had sudden pain after. Rates pain severity as a 7-8/10. Has tried ice and ibuprofen. Felt the most pain relief with Tramadol.   Patient is a 49 y.o. male presenting with knee pain.  Knee Pain Location:  Knee Knee location:  R knee Pain details:    Quality:  Sharp and throbbing   Radiates to:  Does not radiate   Onset quality:  Sudden Chronicity:  New Dislocation: no   Prior injury to area:  Unable to specify Relieved by:  Ice, immobilization and NSAIDs Worsened by:  Activity and bearing weight Associated symptoms: no back pain, no fever, no muscle weakness and no numbness     Past Medical History  Diagnosis Date  . Penile lesion    Past Surgical History  Procedure Laterality Date  . Anterior cervical decomp/discectomy fusion  12-31-2002    C5 -- C7  . Posterior fusion cervical spine  02-26-2005    C5 -- C7  . Penile biopsy N/A 08/23/2012    Procedure: EXCISIONAL BIOPSY OF PENILE LESION;  Surgeon: Milford Cage, MD;  Location: [facility];  Service: Urology;  Laterality: N/A;  EXCISIONAL BIOPSY OF PENILE LESION     No family history on file. Social History  Substance Use Topics  . Smoking status: Former Smoker -- 3.00 packs/day for 21 years    Types: Cigarettes    Quit date: 08/18/2002  . Smokeless tobacco: Former Neurosurgeon  . Alcohol Use: Yes     Comment: RARE    Review of Systems  Constitutional: Negative for fever.  Musculoskeletal: Negative for back pain.  Also per HPI  Allergies  Sulfa antibiotics  Home Medications   Prior to Admission medications   Medication Sig Start Date End Date Taking?  Authorizing Provider  cyclobenzaprine (FLEXERIL) 10 MG tablet Take 1 tablet (10 mg total) by mouth 2 (two) times daily as needed for muscle spasms. Patient not taking: Reported on 06/29/2014 03/04/14   Ladona Mow, PA-C  diazepam (VALIUM) 5 MG tablet Take 1 tablet (5 mg total) by mouth 2 (two) times daily. 06/05/14   Courtney Forcucci, PA-C  HYDROcodone-acetaminophen (NORCO/VICODIN) 5-325 MG per tablet Take 1-2 tablets by mouth every 4 (four) hours as needed for moderate pain or severe pain. Patient not taking: Reported on 06/29/2014 03/04/14   Ladona Mow, PA-C  ibuprofen (ADVIL,MOTRIN) 800 MG tablet Take 1 tablet (800 mg total) by mouth every 6 (six) hours as needed for moderate pain. 01/11/15   Lyndal Pulley, MD  methocarbamol (ROBAXIN) 500 MG tablet Take 1 tablet (500 mg total) by mouth 2 (two) times daily. 06/29/14   Derwood Kaplan, MD  naproxen (NAPROSYN) 500 MG tablet Take 1 tablet (500 mg total) by mouth 2 (two) times daily. 06/05/14   Courtney Forcucci, PA-C  oxyCODONE-acetaminophen (PERCOCET/ROXICET) 5-325 MG per tablet Take 1 tablet by mouth every 4 (four) hours as needed for severe pain. 06/29/14   Derwood Kaplan, MD  traMADol (ULTRAM) 50 MG tablet Take 1 tablet (50 mg total) by mouth every 6 (six) hours as needed. 03/01/15   Antinette Keough  Y Nichols Corter, DO   BP 159/88 mmHg  Pulse 90  Temp(Src) 97.7 F (36.5 C) (Oral)  Resp 16  SpO2 96% Physical Exam  Constitutional: He appears well-developed and well-nourished. No distress.  HENT:  Head: Normocephalic and atraumatic.  Eyes: EOM are normal.  Cardiovascular: Normal rate, regular rhythm, normal heart sounds and intact distal pulses.   Pulmonary/Chest: Effort normal and breath sounds normal.  Musculoskeletal: He exhibits no edema.  Neurological: He is alert.  Skin: Skin is warm and dry. No rash noted. No erythema.  Psychiatric: He has a normal mood and affect.  Right Knee: Swelling to inspection with no erythema or obvious bony abnormalities. Palpation  with no warmth, joint line tenderness medially, no patellar tenderness, or condyle tenderness. ROM limited in flexion and normal extension. Ligaments with consistent endpoints including ACL, PCL, LCL, MCL. Positive Apley's test for medial mensicus Non-painful patellar compression. Patellar glide without crepitus. Patellar and quadriceps tendons unremarkable. Hamstring and quadriceps strength is normal.   ED Course  Procedures (including critical care time) Labs Review Labs Reviewed - No data to display  Imaging Review Dg Knee Complete 4 Views Right  03/01/2015   CLINICAL DATA:  Right knee pain for 2 days, twisted right knee at home  EXAM: RIGHT KNEE - COMPLETE 4+ VIEW  COMPARISON:  None.  FINDINGS: Minimal lateral and mild-to-moderate medial arthritic change with narrowing and osteophyte formation. Minimal patellofemoral arthritic change. No significant joint effusion.  IMPRESSION: No fracture or dislocation.  Mild arthritic change   Electronically Signed   By: Esperanza Heir M.D.   On: 03/01/2015 09:19   I have personally reviewed and evaluated these images and lab results as part of my medical decision-making.   EKG Interpretation None      MDM   Final diagnoses:  Knee pain, acute, right   Patient presented to the ED with acute right knee pain. Mechanism of injury and physical exam seem consistent with medial meniscal tear. DG right knee without fracture or dislocation. Patient given knee immobilizer and pain medication. He is to follow-up with his orthopedic doctor next week. Stable for discharge.   Caryl Ada, DO 03/01/2015, 9:38 AM PGY-2, [facility]    Pincus Large, DO 03/01/15 1610  Nelva Nay, MD 03/01/15 1028

## 2015-03-01 NOTE — ED Notes (Signed)
Per pt, states he twisted right knee yesterday 

## 2015-03-01 NOTE — ED Notes (Signed)
Knee sleeve provided from ortho closet.

## 2015-03-01 NOTE — ED Notes (Signed)
Trying to DC pt but working on getting a knee sleeve, pt sts has knee immobilizer at home and it's too cumbersome.  Ortho tech does not come in until 12:00 noon today per operator.

## 2015-03-08 ENCOUNTER — Emergency Department (HOSPITAL_COMMUNITY)
Admission: EM | Admit: 2015-03-08 | Discharge: 2015-03-08 | Disposition: A | Discharge status: Home / Self Care | Payer: 59 | Attending: Emergency Medicine | Admitting: Emergency Medicine

## 2015-03-08 ENCOUNTER — Emergency Department (HOSPITAL_COMMUNITY): Payer: 59

## 2015-03-08 ENCOUNTER — Encounter (HOSPITAL_COMMUNITY): Payer: Self-pay

## 2015-03-08 DIAGNOSIS — Z79899 Other long term (current) drug therapy: Secondary | ICD-10-CM | POA: Diagnosis not present

## 2015-03-08 DIAGNOSIS — Y9289 Other specified places as the place of occurrence of the external cause: Secondary | ICD-10-CM | POA: Diagnosis not present

## 2015-03-08 DIAGNOSIS — Z87438 Personal history of other diseases of male genital organs: Secondary | ICD-10-CM | POA: Insufficient documentation

## 2015-03-08 DIAGNOSIS — Y998 Other external cause status: Secondary | ICD-10-CM | POA: Diagnosis not present

## 2015-03-08 DIAGNOSIS — X58XXXA Exposure to other specified factors, initial encounter: Secondary | ICD-10-CM | POA: Diagnosis not present

## 2015-03-08 DIAGNOSIS — M25561 Pain in right knee: Secondary | ICD-10-CM

## 2015-03-08 DIAGNOSIS — Z791 Long term (current) use of non-steroidal anti-inflammatories (NSAID): Secondary | ICD-10-CM | POA: Insufficient documentation

## 2015-03-08 DIAGNOSIS — Y9389 Activity, other specified: Secondary | ICD-10-CM | POA: Insufficient documentation

## 2015-03-08 DIAGNOSIS — Z87891 Personal history of nicotine dependence: Secondary | ICD-10-CM | POA: Diagnosis not present

## 2015-03-08 DIAGNOSIS — S8991XA Unspecified injury of right lower leg, initial encounter: Secondary | ICD-10-CM | POA: Insufficient documentation

## 2015-03-08 MED ORDER — TRAMADOL-ACETAMINOPHEN 37.5-325 MG PO TABS: 1.0000 | ORAL_TABLET | Freq: Four times a day (QID) | ORAL | Status: DC | PRN

## 2015-03-08 MED ORDER — TRAMADOL-ACETAMINOPHEN 37.5-325 MG PO TABS
1.0000 | ORAL_TABLET | Freq: Once | ORAL | Status: AC
Start: 2015-03-08 — End: 2015-03-08
  Administered 2015-03-08: 1 via ORAL
  Filled 2015-03-08: qty 1

## 2015-03-08 NOTE — Discharge Instructions (Signed)
You were evaluated in the ED today for your right knee pain. There does not appear to be an emergent cause for your symptoms at this time. Please follow-up with your orthopedist for regularly scheduled appointment. Take your pain medicine as directed. Do not take this before driving or operating machinery. He may wear your knee brace while active, avoid wearing it during sleep. Return to ED for worsening symptoms.  Arthralgia Your caregiver has diagnosed you as suffering from an arthralgia. Arthralgia means there is pain in a joint. This can come from many reasons including:  Bruising the joint which causes soreness (inflammation) in the joint.  Wear and tear on the joints which occur as we grow older (osteoarthritis).  Overusing the joint.  Various forms of arthritis.  Infections of the joint. Regardless of the cause of pain in your joint, most of these different pains respond to anti-inflammatory drugs and rest. The exception to this is when a joint is infected, and these cases are treated with antibiotics, if it is a bacterial infection. HOME CARE INSTRUCTIONS   Rest the injured area for as long as directed by your caregiver. Then slowly start using the joint as directed by your caregiver and as the pain allows. Crutches as directed may be useful if the ankles, knees or hips are involved. If the knee was splinted or casted, continue use and care as directed. If an stretchy or elastic wrapping bandage has been applied today, it should be removed and re-applied every 3 to 4 hours. It should not be applied tightly, but firmly enough to keep swelling down. Watch toes and feet for swelling, bluish discoloration, coldness, numbness or excessive pain. If any of these problems (symptoms) occur, remove the ace bandage and re-apply more loosely. If these symptoms persist, contact your caregiver or return to this location.  For the first 24 hours, keep the injured extremity elevated on pillows while lying  down.  Apply ice for 15-20 minutes to the sore joint every couple hours while awake for the first half day. Then 03-04 times per day for the first 48 hours. Put the ice in a plastic bag and place a towel between the bag of ice and your skin.  Wear any splinting, casting, elastic bandage applications, or slings as instructed.  Only take over-the-counter or prescription medicines for pain, discomfort, or fever as directed by your caregiver. Do not use aspirin immediately after the injury unless instructed by your physician. Aspirin can cause increased bleeding and bruising of the tissues.  If you were given crutches, continue to use them as instructed and do not resume weight bearing on the sore joint until instructed. Persistent pain and inability to use the sore joint as directed for more than 2 to 3 days are warning signs indicating that you should see a caregiver for a follow-up visit as soon as possible. Initially, a hairline fracture (break in bone) may not be evident on X-rays. Persistent pain and swelling indicate that further evaluation, non-weight bearing or use of the joint (use of crutches or slings as instructed), or further X-rays are indicated. X-rays may sometimes not show a small fracture until a week or 10 days later. Make a follow-up appointment with your own caregiver or one to whom we have referred you. A radiologist (specialist in reading X-rays) may read your X-rays. Make sure you know how you are to obtain your X-ray results. Do not assume everything is normal if you do not hear from [place]. SEEK MEDICAL  CARE IF: Bruising, swelling, or pain increases. SEEK IMMEDIATE MEDICAL CARE IF:   Your fingers or toes are numb or blue.  The pain is not responding to medications and continues to stay the same or get worse.  The pain in your joint becomes severe.  You develop a fever over 102 F (38.9 C).  It becomes impossible to move or use the joint. MAKE SURE YOU:   Understand these  instructions.  Will watch your condition.  Will get help right away if you are not doing well or get worse. Document Released: 05/31/2005 Document Revised: 08/23/2011 Document Reviewed: 01/17/2008 [facility] Patient Information 2015 [place], [place]. This information is not intended to replace advice given to you by your health care provider. Make sure you discuss any questions you have with your health care provider.

## 2015-03-08 NOTE — ED Notes (Signed)
Radiology Pending- Not completed

## 2015-03-08 NOTE — ED Notes (Signed)
He states he mistepped while descending from his truck early this morning, thereby twisting his right knee.

## 2015-03-08 NOTE — ED Provider Notes (Signed)
CSN: [id number]     Arrival date & time 03/08/15  1049 History   First MD Initiated Contact with Patient 03/08/15 1109     Chief Complaint  Patient presents with  . Knee Injury     (Consider location/radiation/quality/duration/timing/severity/associated sxs/prior Treatment) HPI Mike Watkins is a 49 y.o. male who comes in for evaluation of right knee pain. Patient states he was stepping out of his truck this morning at approximately 5 AM, "landed funny and twisted my knee". Reports sudden onset, sharp pain to inside of right knee. Patient states he was seen in the ED 1 week ago for similar symptoms and injury, was prescribed tramadol and that seemed to help with his discomfort. He reports over the past week his injuries seem to be getting better, but he reinjured it today getting out of his truck. Pain is exacerbated with movement, rated a 7/10. Denies any fevers, chills, numbness or weakness. Reports he has an appointment with his orthopedist on October 4 for reevaluation. No other aggravating or modifying factors.  Past Medical History  Diagnosis Date  . Penile lesion    Past Surgical History  Procedure Laterality Date  . Anterior cervical decomp/discectomy fusion  12-31-2002    C5 -- C7  . Posterior fusion cervical spine  02-26-2005    C5 -- C7  . Penile biopsy N/A 08/23/2012    Procedure: EXCISIONAL BIOPSY OF PENILE LESION;  Surgeon: Milford Cage, MD;  Location: [facility];  Service: Urology;  Laterality: N/A;  EXCISIONAL BIOPSY OF PENILE LESION     No family history on file. Social History  Substance Use Topics  . Smoking status: Former Smoker -- 3.00 packs/day for 21 years    Types: Cigarettes    Quit date: 08/18/2002  . Smokeless tobacco: Former Neurosurgeon  . Alcohol Use: Yes     Comment: RARE    Review of Systems A 10 point review of systems was completed and was negative except for pertinent positives and negatives as mentioned in the history of  present illness     Allergies  Sulfa antibiotics  Home Medications   Prior to Admission medications   Medication Sig Start Date End Date Taking? Authorizing Provider  cyclobenzaprine (FLEXERIL) 10 MG tablet Take 1 tablet (10 mg total) by mouth 2 (two) times daily as needed for muscle spasms. Patient not taking: Reported on 06/29/2014 03/04/14   Ladona Mow, PA-C  diazepam (VALIUM) 5 MG tablet Take 1 tablet (5 mg total) by mouth 2 (two) times daily. 06/05/14   Courtney Forcucci, PA-C  HYDROcodone-acetaminophen (NORCO/VICODIN) 5-325 MG per tablet Take 1-2 tablets by mouth every 4 (four) hours as needed for moderate pain or severe pain. Patient not taking: Reported on 06/29/2014 03/04/14   Ladona Mow, PA-C  ibuprofen (ADVIL,MOTRIN) 800 MG tablet Take 1 tablet (800 mg total) by mouth every 6 (six) hours as needed for moderate pain. 01/11/15   Lyndal Pulley, MD  methocarbamol (ROBAXIN) 500 MG tablet Take 1 tablet (500 mg total) by mouth 2 (two) times daily. 06/29/14   Derwood Kaplan, MD  naproxen (NAPROSYN) 500 MG tablet Take 1 tablet (500 mg total) by mouth 2 (two) times daily. 06/05/14   Courtney Forcucci, PA-C  oxyCODONE-acetaminophen (PERCOCET/ROXICET) 5-325 MG per tablet Take 1 tablet by mouth every 4 (four) hours as needed for severe pain. 06/29/14   Derwood Kaplan, MD  traMADol (ULTRAM) 50 MG tablet Take 1 tablet (50 mg total) by mouth every 6 (six) hours as  needed. 03/01/15   Pincus Large, DO  traMADol-acetaminophen (ULTRACET) 37.5-325 MG per tablet Take 1 tablet by mouth every 6 (six) hours as needed. 03/08/15   Joycie Peek, PA-C   BP 127/86 mmHg  Pulse 88  Temp(Src) 98.4 F (36.9 C) (Oral)  Resp 16  SpO2 96% Physical Exam  Constitutional:  Awake, alert, nontoxic appearance.  HENT:  Head: Atraumatic.  Eyes: Right eye exhibits no discharge. Left eye exhibits no discharge.  Neck: Neck supple.  Cardiovascular: Normal rate, regular rhythm and normal heart sounds.   Pulmonary/Chest:  Effort normal. He exhibits no tenderness.  Abdominal: Soft. There is no tenderness. There is no rebound.  Musculoskeletal: Normal range of motion.  Baseline ROM, no obvious new focal weakness. Tenderness diffusely to medial aspect of her right knee. Mild swelling compared to left knee. No overt warmth or erythema. Maintains full active range of motion without crepitus. No ligamentous laxity.  Neurological:  Mental status and motor strength appears baseline for patient and situation.  Skin: No rash noted.  Psychiatric: He has a normal mood and affect.  Nursing note and vitals reviewed.   ED Course  Procedures (including critical care time) Labs Review Labs Reviewed - No data to display  Imaging Review Dg Knee Complete 4 Views Right  03/08/2015   CLINICAL DATA:  Twisted knee and heard pop sound. Knee pain that radiates to the tibia.  EXAM: RIGHT KNEE - COMPLETE 4+ VIEW  COMPARISON:  03/01/2015  FINDINGS: Again noted are degenerative changes in the knee compartments, most prominent at the medial knee compartment. Negative for an acute fracture or dislocation. No evidence for a knee joint effusion. Alignment of the knee is normal.  IMPRESSION: No acute bone abnormality.  Mild osteoarthritic changes.   Electronically Signed   By: Richarda Overlie M.D.   On: 03/08/2015 11:48   I have personally reviewed and evaluated these images and lab results as part of my medical decision-making.   EKG Interpretation None     Filed Vitals:   03/08/15 1059 03/08/15 1227  BP: 127/86   Pulse: 105 88  Temp: 98.4 F (36.9 C)   TempSrc: Oral   Resp: 16   SpO2: 95% 96%    MDM  Vitals stable - WNL -afebrile Pt resting comfortably in ED. PE--physical exam as above. Tenderness diffusely throughout right medial knee. Neurovascularly intact. Imaging--plain films of right knee show medial osteoarthritic changes with no acute osseous abnormalities.  DDX--patient presents after injuring her right knee after  stepping down out of truck. Low suspicion for tibial plateau fracture. Upon review of narcotic database, patient has been taking tramadol since June and prescription recently expired today. Discussed will not refill tramadol with no new objective findings. We'll give Ultracet to taper down tramadol medication. May also use NSAID as accommodation therapy. Patient requests knee immobilizer. Patient will need to follow-up with his orthopedist for regularly scheduled appointment on October 4 for further evaluation and management of symptoms.  I discussed all relevant lab findings and imaging results with pt and they verbalized understanding. Discussed f/u with PCP within 48 hrs and return precautions, pt very amenable to plan.  Final diagnoses:  Knee pain, right        Joycie Peek, PA-C 03/08/15 1734  Alvira Monday, MD 03/11/15 1034

## 2015-05-02 ENCOUNTER — Encounter (HOSPITAL_COMMUNITY): Payer: Self-pay

## 2015-05-02 ENCOUNTER — Emergency Department (HOSPITAL_COMMUNITY): Payer: 59

## 2015-05-02 ENCOUNTER — Emergency Department (HOSPITAL_COMMUNITY)
Admission: EM | Admit: 2015-05-02 | Discharge: 2015-05-02 | Disposition: A | Discharge status: Home / Self Care | Payer: 59 | Attending: Emergency Medicine | Admitting: Emergency Medicine

## 2015-05-02 DIAGNOSIS — X58XXXA Exposure to other specified factors, initial encounter: Secondary | ICD-10-CM | POA: Insufficient documentation

## 2015-05-02 DIAGNOSIS — Y9389 Activity, other specified: Secondary | ICD-10-CM | POA: Insufficient documentation

## 2015-05-02 DIAGNOSIS — Y9289 Other specified places as the place of occurrence of the external cause: Secondary | ICD-10-CM | POA: Diagnosis not present

## 2015-05-02 DIAGNOSIS — Z880 Allergy status to penicillin: Secondary | ICD-10-CM | POA: Diagnosis not present

## 2015-05-02 DIAGNOSIS — R0789 Other chest pain: Secondary | ICD-10-CM

## 2015-05-02 DIAGNOSIS — S299XXA Unspecified injury of thorax, initial encounter: Secondary | ICD-10-CM | POA: Diagnosis present

## 2015-05-02 DIAGNOSIS — Z87438 Personal history of other diseases of male genital organs: Secondary | ICD-10-CM | POA: Diagnosis not present

## 2015-05-02 DIAGNOSIS — Z87891 Personal history of nicotine dependence: Secondary | ICD-10-CM | POA: Insufficient documentation

## 2015-05-02 DIAGNOSIS — Y998 Other external cause status: Secondary | ICD-10-CM | POA: Insufficient documentation

## 2015-05-02 MED ORDER — NAPROXEN 500 MG PO TABS: 500.0000 mg | ORAL_TABLET | Freq: Two times a day (BID) | ORAL | Status: DC

## 2015-05-02 MED ORDER — TRAMADOL HCL 50 MG PO TABS: 50.0000 mg | ORAL_TABLET | Freq: Four times a day (QID) | ORAL | Status: DC | PRN

## 2015-05-02 NOTE — Discharge Instructions (Signed)

## 2015-05-02 NOTE — ED Provider Notes (Signed)
CSN: [id number]     Arrival date & time 05/02/15  0604 History   First MD Initiated Contact with Patient 05/02/15 [phone]     Chief Complaint  Patient presents with  . 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  He felt something pull in his chest after he did that but as the night progressed the pain increased.  The pain increases with movement and palpation. No fevers.  No shortness of breath. Past Medical History  Diagnosis Date  . Penile lesion    Past Surgical History  Procedure Laterality Date  . Anterior cervical decomp/discectomy fusion  12-31-2002    C5 -- C7  . Posterior fusion cervical spine  02-26-2005    C5 -- C7  . Penile biopsy N/A 08/23/2012    Procedure: EXCISIONAL BIOPSY OF PENILE LESION;  Surgeon: Milford Cage, MD;  Location: [facility];  Service: Urology;  Laterality: N/A;  EXCISIONAL BIOPSY OF PENILE LESION     History reviewed. No pertinent family history. Social History  Substance Use Topics  . Smoking status: Former Smoker -- 3.00 packs/day for 21 years    Types: Cigarettes    Quit date: 08/18/2002  . Smokeless tobacco: Former Neurosurgeon  . Alcohol Use: Yes     Comment: RARE    Review of Systems  All other systems reviewed and are negative.     Allergies  Sulfa antibiotics and Penicillins  Home Medications   Prior to Admission medications   Medication Sig Start Date End Date Taking? Authorizing Provider  ibuprofen (ADVIL,MOTRIN) 800 MG tablet Take 1 tablet (800 mg total) by mouth every 6 (six) hours as needed for moderate pain. 01/11/15  Yes Lyndal Pulley, MD  cyclobenzaprine (FLEXERIL) 10 MG tablet Take 1 tablet (10 mg total) by mouth 2 (two) times daily as needed for muscle spasms. Patient not taking: Reported on 06/29/2014 03/04/14   Ladona Mow, PA-C  diazepam (VALIUM) 5 MG tablet Take 1 tablet (5 mg total) by mouth 2 (two) times daily. Patient not taking: Reported on  05/02/2015 06/05/14   Terri Piedra, PA-C  HYDROcodone-acetaminophen (NORCO/VICODIN) 5-325 MG per tablet Take 1-2 tablets by mouth every 4 (four) hours as needed for moderate pain or severe pain. Patient not taking: Reported on 06/29/2014 03/04/14   Ladona Mow, PA-C  methocarbamol (ROBAXIN) 500 MG tablet Take 1 tablet (500 mg total) by mouth 2 (two) times daily. Patient not taking: Reported on 05/02/2015 06/29/14   Derwood Kaplan, MD  naproxen (NAPROSYN) 500 MG tablet Take 1 tablet (500 mg total) by mouth 2 (two) times daily. Patient not taking: Reported on 05/02/2015 06/05/14   Terri Piedra, PA-C  oxyCODONE-acetaminophen (PERCOCET/ROXICET) 5-325 MG per tablet Take 1 tablet by mouth every 4 (four) hours as needed for severe pain. Patient not taking: Reported on 05/02/2015 06/29/14   Derwood Kaplan, MD  traMADol (ULTRAM) 50 MG tablet Take 1 tablet (50 mg total) by mouth every 6 (six) hours as needed. Patient not taking: Reported on 05/02/2015 03/01/15   Pincus Large, DO  traMADol-acetaminophen (ULTRACET) 37.5-325 MG per tablet Take 1 tablet by mouth every 6 (six) hours as needed. Patient not taking: Reported on 05/02/2015 03/08/15   Joycie Peek, PA-C   BP 132/93 mmHg  Pulse 83  Temp(Src) 97.5 F (36.4 C) (Oral)  Resp 18  Ht  (1.778 m)  Wt 260 lb (117.935 kg)  BMI 37.31 kg/m2  SpO2 98% Physical Exam  Constitutional: He appears well-developed and well-nourished. No distress.  HENT:  Head: Normocephalic and atraumatic.  Right Ear: External ear normal.  Left Ear: External ear normal.  Eyes: Conjunctivae are normal. Right eye exhibits no discharge. Left eye exhibits no discharge. No scleral icterus.  Neck: Neck supple. No tracheal deviation present.  Cardiovascular: Normal rate, regular rhythm and intact distal pulses.   Pulmonary/Chest: Effort normal and breath sounds normal. No stridor. No respiratory distress. He has no wheezes. He has no rales. He exhibits tenderness.   Reproducible chest wall tenderness, no crepitus or deformity  Abdominal: Soft. Bowel sounds are normal. He exhibits no distension. There is no tenderness. There is no rebound and no guarding.  Musculoskeletal: He exhibits no edema or tenderness.  Neurological: He is alert. He has normal strength. No cranial nerve deficit (no facial droop, extraocular movements intact, no slurred speech) or sensory deficit. He exhibits normal muscle tone. He displays no seizure activity. Coordination normal.  Skin: Skin is warm and dry. No rash noted.  Psychiatric: He has a normal mood and affect.  Nursing note and vitals reviewed.   ED Course  Procedures   Imaging Review Dg Chest 2 View  05/02/2015  CLINICAL DATA:  Pain and soreness across mid chest after turning a ratchet. EXAM: CHEST  2 VIEW COMPARISON:  02/05/2014 FINDINGS: Lung volumes are low. The cardiomediastinal contours are normal. Pulmonary vasculature is normal. No consolidation, pleural effusion, or pneumothorax. No acute osseous abnormalities are seen. IMPRESSION: Hypoventilatory chest without acute process. Electronically Signed   By: Rubye OaksMelanie  Ehinger M.D.   On: 05/02/2015 07:00   I have personally reviewed and evaluated these images and lab results as part of my medical decision-making.   EKG Interpretation   Date/Time:  Friday May 02 2015 07:52:40 EST Ventricular Rate:  70 PR Interval:  143 QRS Duration: 86 QT Interval:  424 QTC Calculation: 457 R Axis:   15 Text Interpretation:  Sinus rhythm Low voltage, precordial leads Abnormal  R-wave progression, early transition Borderline T abnormalities, inferior  leads No significant change since last tracing Confirmed by Guerin Lashomb  MD-J,  Shankar Silber ([id number]) on 05/02/2015 8:04:48 AM      MDM   Final diagnoses:  Chest wall pain    Symptoms are consistent with a muscle strain. Doubt acute cardiac etiology.  No PNA or PTX.  Patient states he has taken Ultram in the past.     Linwood DibblesJon Kjirsten Bloodgood,  MD 05/02/15 [phone]

## 2015-05-02 NOTE — ED Notes (Signed)
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

## 2015-07-04 ENCOUNTER — Emergency Department (HOSPITAL_COMMUNITY)
Admission: EM | Admit: 2015-07-04 | Discharge: 2015-07-04 | Disposition: A | Discharge status: Home / Self Care | Payer: 59 | Attending: Emergency Medicine | Admitting: Emergency Medicine

## 2015-07-04 ENCOUNTER — Encounter (HOSPITAL_COMMUNITY): Payer: Self-pay | Admitting: Emergency Medicine

## 2015-07-04 ENCOUNTER — Emergency Department (HOSPITAL_COMMUNITY): Payer: 59

## 2015-07-04 DIAGNOSIS — Z87438 Personal history of other diseases of male genital organs: Secondary | ICD-10-CM | POA: Insufficient documentation

## 2015-07-04 DIAGNOSIS — S8391XA Sprain of unspecified site of right knee, initial encounter: Secondary | ICD-10-CM

## 2015-07-04 DIAGNOSIS — Y9289 Other specified places as the place of occurrence of the external cause: Secondary | ICD-10-CM | POA: Insufficient documentation

## 2015-07-04 DIAGNOSIS — Z880 Allergy status to penicillin: Secondary | ICD-10-CM | POA: Diagnosis not present

## 2015-07-04 DIAGNOSIS — Y998 Other external cause status: Secondary | ICD-10-CM | POA: Diagnosis not present

## 2015-07-04 DIAGNOSIS — Y9389 Activity, other specified: Secondary | ICD-10-CM | POA: Diagnosis not present

## 2015-07-04 DIAGNOSIS — X58XXXA Exposure to other specified factors, initial encounter: Secondary | ICD-10-CM | POA: Diagnosis not present

## 2015-07-04 DIAGNOSIS — Z791 Long term (current) use of non-steroidal anti-inflammatories (NSAID): Secondary | ICD-10-CM | POA: Insufficient documentation

## 2015-07-04 DIAGNOSIS — Z87891 Personal history of nicotine dependence: Secondary | ICD-10-CM | POA: Insufficient documentation

## 2015-07-04 DIAGNOSIS — S8991XA Unspecified injury of right lower leg, initial encounter: Secondary | ICD-10-CM | POA: Diagnosis present

## 2015-07-04 MED ORDER — TRAMADOL HCL 50 MG PO TABS: 50.0000 mg | ORAL_TABLET | Freq: Four times a day (QID) | ORAL | Status: DC | PRN

## 2015-07-04 NOTE — Discharge Instructions (Signed)
Knee Sprain Where your knee immobilizer as needed for comfort. Try to use it as little as possible so that your knee does not get stiff. Take the medication as needed for pain. Keep your scheduled appointment with [facility] orthopedics if you continue to have significant pain or difficulty walking by next week. Call the [facility] in community wellness Center to get a primary care physician. A knee sprain is a tear in one of the strong, fibrous tissues that connect the bones (ligaments) in your knee. The severity of the sprain depends on how much of the ligament is torn. The tear can be either partial or complete. CAUSES  Often, sprains are a result of a fall or injury. The force of the impact causes the fibers of your ligament to stretch too much. This excess tension causes the fibers of your ligament to tear. SIGNS AND SYMPTOMS  You may have some loss of motion in your knee. Other symptoms include:  Bruising.  Pain in the knee area.  Tenderness of the knee to the touch.  Swelling. DIAGNOSIS  To diagnose a knee sprain, your health care provider will physically examine your knee. Your health care provider may also suggest an X-ray exam of your knee to make sure no bones are broken. TREATMENT  If your ligament is only partially torn, treatment usually involves keeping the knee in a fixed position (immobilization) or bracing your knee for activities that require movement for several weeks. To do this, your health care provider will apply a bandage, cast, or splint to keep your knee from moving and to support your knee during movement until it heals. For a partially torn ligament, the healing process usually takes 4-6 weeks. If your ligament is completely torn, depending on which ligament it is, you may need surgery to reconnect the ligament to the bone or reconstruct it. After surgery, a cast or splint may be applied and will need to stay on your knee for 4-6 weeks while your ligament heals. HOME  CARE INSTRUCTIONS  Keep your injured knee elevated to decrease swelling.  To ease pain and swelling, apply ice to the injured area:  Put ice in a plastic bag.  Place a towel between your skin and the bag.  Leave the ice on for 20 minutes, 2-3 times a day.  Only take medicine for pain as directed by your health care provider.  Do not leave your knee unprotected until pain and stiffness go away (usually 4-6 weeks).  If you have a cast or splint, do not allow it to get wet. If you have been instructed not to remove it, cover it with a plastic bag when you shower or bathe. Do not swim.  Your health care provider may suggest exercises for you to do during your recovery to prevent or limit permanent weakness and stiffness. SEEK IMMEDIATE MEDICAL CARE IF:  Your cast or splint becomes damaged.  Your pain becomes worse.  You have significant pain, swelling, or numbness below the cast or splint. MAKE SURE YOU:  Understand these instructions.  Will watch your condition.  Will get help right away if you are not doing well or get worse.   This information is not intended to replace advice given to you by your health care provider. Make sure you discuss any questions you have with your health care provider.   Document Released: 05/31/2005 Document Revised: 06/21/2014 Document Reviewed: 01/10/2013 Elsevier Interactive Patient Education [employer]

## 2015-07-04 NOTE — ED Provider Notes (Signed)
CSN: [id number]     Arrival date & time 07/04/15  0701 History   First MD Initiated Contact with Patient 07/04/15 [phone]     Chief Complaint  Patient presents with  . Knee Pain     (Consider location/radiation/quality/duration/timing/severity/associated sxs/prior Treatment) HPI Patient was walking down steps yesterday 4:30 PM when his right knee "buckled." Since the event he complains of pain at his right knee, medial aspect. Worse with weightbearing. Improved with nonweightbearing. No other complaint. No other injury. He did not fall. He's been ambulatory since the event. He treated himself with ibuprofen 800 mg one hour ago. No other associated symptoms Past Medical History  Diagnosis Date  . Penile lesion    Past Surgical History  Procedure Laterality Date  . Anterior cervical decomp/discectomy fusion  12-31-2002    C5 -- C7  . Posterior fusion cervical spine  02-26-2005    C5 -- C7  . Penile biopsy N/A 08/23/2012    Procedure: EXCISIONAL BIOPSY OF PENILE LESION;  Surgeon: Milford Cage, MD;  Location: [facility];  Service: Urology;  Laterality: N/A;  EXCISIONAL BIOPSY OF PENILE LESION     History reviewed. No pertinent family history. Social History  Substance Use Topics  . Smoking status: Former Smoker -- 3.00 packs/day for 21 years    Types: Cigarettes    Quit date: 08/18/2002  . Smokeless tobacco: Former Neurosurgeon  . Alcohol Use: Yes     Comment: RARE    Review of Systems  Constitutional: Negative.   Musculoskeletal: Positive for arthralgias and gait problem.       Right knee pain      Allergies  Sulfa antibiotics and Penicillins  Home Medications   Prior to Admission medications   Medication Sig Start Date End Date Taking? Authorizing Provider  ibuprofen (ADVIL,MOTRIN) 800 MG tablet Take 1 tablet (800 mg total) by mouth every 6 (six) hours as needed for moderate pain. 01/11/15   Lyndal Pulley, MD  naproxen (NAPROSYN) 500 MG tablet Take 1  tablet (500 mg total) by mouth 2 (two) times daily. 05/02/15   Linwood Dibbles, MD  traMADol (ULTRAM) 50 MG tablet Take 1 tablet (50 mg total) by mouth every 6 (six) hours as needed. 05/02/15   Linwood Dibbles, MD   BP 136/95 mmHg  Pulse 97  Temp(Src) 97.8 F (36.6 C) (Oral)  Resp 18  SpO2 100% Physical Exam  Constitutional: He is oriented to person, place, and time. He appears well-developed and well-nourished. No distress.  HENT:  Head: Normocephalic and atraumatic.  Eyes: EOM are normal.  Neck: Neck supple.  Cardiovascular: Normal rate.   Pulmonary/Chest: Effort normal.  Abdominal:  Obese  Musculoskeletal:  Right lower extremity no swelling no deformity he is tender at the medial aspect of the knee. No Lachman sign no posterior drawer sign. No ligamentous laxity. DP pulse 2+. All other extremities without deformity or swelling, neurovascularly intact.  Neurological: He is oriented to person, place, and time. No cranial nerve deficit.  Walks with a limp favoring right leg  Nursing note and vitals reviewed.  Declines pain medicine ED Course  Procedures (including critical care time) Labs Review Labs Reviewed - No data to display  Imaging Review No results found. I have personally reviewed and evaluated these images and lab results as part of my medical decision-making.   EKG Interpretation None     X-ray viewed by me Results for orders placed or performed during the hospital encounter of 02/05/14  Urinalysis,  Routine w reflex microscopic  Result Value Ref Range   Color, Urine YELLOW YELLOW   APPearance CLEAR CLEAR   Specific Gravity, Urine 1.025 1.005 - 1.030   pH 6.0 5.0 - 8.0   Glucose, UA NEGATIVE NEGATIVE mg/dL   Hgb urine dipstick NEGATIVE NEGATIVE   Bilirubin Urine NEGATIVE NEGATIVE   Ketones, ur NEGATIVE NEGATIVE mg/dL   Protein, ur NEGATIVE NEGATIVE mg/dL   Urobilinogen, UA 0.2 0.0 - 1.0 mg/dL   Nitrite NEGATIVE NEGATIVE   Leukocytes, UA NEGATIVE NEGATIVE   Dg  Knee Complete 4 Views Right  07/04/2015  CLINICAL DATA:  Knee buckled while patient climbing down steps. EXAM: RIGHT KNEE - COMPLETE 4+ VIEW COMPARISON:  March 08, 2015 FINDINGS: Frontal, lateral, and bilateral oblique views were obtained. There is mild soft tissue swelling. There is no demonstrable fracture or dislocation. No appreciable joint effusion. There is mild narrowing medially with spurring medially. There is minimal spurring along the posterior patella. There is a benign appearing exostosis along the distal femoral metaphysis laterally measuring 2.0 x 1.3 cm. IMPRESSION: Stable osteoarthritic change. Benign exostosis along the lateral distal femoral metaphysis. No fracture or joint effusion. Electronically Signed   By: Bretta Bang III M.D.   On: 07/04/2015 07:56    MDM  Plan prescription tramadol. Patient has knee immobilizer at home, which he can use as needed. He has made an appointment with [facility] for 07/25/2015 if needed. He is also referred to [facility] in community wellness Center to get a primary care physician  Final diagnoses:  None   Diagnosis sprain of right knee     Doug Sou, MD 07/04/15 [phone]

## 2015-07-04 NOTE — ED Notes (Signed)
Patient here with complaints of right knee pain. States that his knee "buckled " while coming down stairs. Swelling. Pain 8/10.

## 2015-07-19 ENCOUNTER — Emergency Department (HOSPITAL_COMMUNITY)
Admission: EM | Admit: 2015-07-19 | Discharge: 2015-07-19 | Disposition: A | Discharge status: Home / Self Care | Payer: 59 | Attending: Emergency Medicine | Admitting: Emergency Medicine

## 2015-07-19 ENCOUNTER — Encounter (HOSPITAL_COMMUNITY): Payer: Self-pay | Admitting: Emergency Medicine

## 2015-07-19 DIAGNOSIS — M1990 Unspecified osteoarthritis, unspecified site: Secondary | ICD-10-CM | POA: Diagnosis not present

## 2015-07-19 DIAGNOSIS — Z87438 Personal history of other diseases of male genital organs: Secondary | ICD-10-CM | POA: Diagnosis not present

## 2015-07-19 DIAGNOSIS — Z880 Allergy status to penicillin: Secondary | ICD-10-CM | POA: Insufficient documentation

## 2015-07-19 DIAGNOSIS — M25531 Pain in right wrist: Secondary | ICD-10-CM | POA: Diagnosis present

## 2015-07-19 DIAGNOSIS — Z791 Long term (current) use of non-steroidal anti-inflammatories (NSAID): Secondary | ICD-10-CM | POA: Diagnosis not present

## 2015-07-19 DIAGNOSIS — G5601 Carpal tunnel syndrome, right upper limb: Secondary | ICD-10-CM | POA: Diagnosis not present

## 2015-07-19 DIAGNOSIS — Z87891 Personal history of nicotine dependence: Secondary | ICD-10-CM | POA: Insufficient documentation

## 2015-07-19 HISTORY — DX: Unspecified osteoarthritis, unspecified site: M19.90

## 2015-07-19 MED ORDER — HYDROCODONE-ACETAMINOPHEN 5-325 MG PO TABS: 2.0000 | ORAL_TABLET | ORAL | Status: DC | PRN

## 2015-07-19 MED ORDER — IBUPROFEN 800 MG PO TABS: 800.0000 mg | ORAL_TABLET | Freq: Three times a day (TID) | ORAL | Status: DC

## 2015-07-19 NOTE — ED Provider Notes (Signed)
CSN: [id number]     Arrival date & time 07/19/15  4098 History   First MD Initiated Contact with Patient 07/19/15 1013     Chief Complaint  Patient presents with  . Wrist Pain    2 week hx of wrist pain     (Consider location/radiation/quality/duration/timing/severity/associated sxs/prior Treatment) HPI   He is a 50 year old male who presents to the ED with complaint of right hand pain, onset 2-3 weeks. Patient reports over the last week he has had worsening pain in his right fourth and fifth digits with mild swelling over his right third knuckle. He reports having intermittent sharp pain down his right fourth and fifth digits that extend to his wrist. Endorses intermittent tingling. He notes the pain is worse with movement, denies any alleviating factors. Denies any recent fall, trauma, injury. Denies fever, redness, wound, weakness. He notes he has been taking tramadol and ibuprofen at home with no relief.  Past Medical History  Diagnosis Date  . Penile lesion   . Arthritis    Past Surgical History  Procedure Laterality Date  . Anterior cervical decomp/discectomy fusion  12-31-2002    C5 -- C7  . Posterior fusion cervical spine  02-26-2005    C5 -- C7  . Penile biopsy N/A 08/23/2012    Procedure: EXCISIONAL BIOPSY OF PENILE LESION;  Surgeon: Milford Cage, MD;  Location: [facility];  Service: Urology;  Laterality: N/A;  EXCISIONAL BIOPSY OF PENILE LESION     Family History  Problem Relation Age of Onset  . Hypertension Mother   . Diabetes Mother   . Hypertension Father   . Cancer Father    Social History  Substance Use Topics  . Smoking status: Former Smoker -- 3.00 packs/day for 21 years    Types: Cigarettes    Quit date: 08/18/2002  . Smokeless tobacco: Former Neurosurgeon  . Alcohol Use: Yes     Comment: RARE    Review of Systems  Constitutional: Negative for fever.  Musculoskeletal: Positive for joint swelling and arthralgias (right hand).  Skin:  Negative for wound.  Neurological: Negative for weakness and numbness.      Allergies  Sulfa antibiotics and Penicillins  Home Medications   Prior to Admission medications   Medication Sig Start Date End Date Taking? Authorizing Provider  HYDROcodone-acetaminophen (NORCO/VICODIN) 5-325 MG tablet Take 2 tablets by mouth every 4 (four) hours as needed. 07/19/15   Barrett Henle, PA-C  ibuprofen (ADVIL,MOTRIN) 800 MG tablet Take 1 tablet (800 mg total) by mouth 3 (three) times daily. 07/19/15   Barrett Henle, PA-C  naproxen (NAPROSYN) 500 MG tablet Take 1 tablet (500 mg total) by mouth 2 (two) times daily. 05/02/15   Linwood Dibbles, MD  traMADol (ULTRAM) 50 MG tablet Take 1 tablet (50 mg total) by mouth every 6 (six) hours as needed. 07/04/15   Doug Sou, MD   BP 148/97 mmHg  Pulse 85  Temp(Src) 97.4 F (36.3 C) (Oral)  Resp 17  SpO2 98% Physical Exam  Constitutional: He is oriented to person, place, and time. He appears well-developed and well-nourished.  HENT:  Head: Normocephalic and atraumatic.  Eyes: Conjunctivae and EOM are normal. Right eye exhibits no discharge. Left eye exhibits no discharge. No scleral icterus.  Neck: Normal range of motion. Neck supple.  Cardiovascular: Normal rate and intact distal pulses.   Pulmonary/Chest: Effort normal. No respiratory distress.  Abdominal: He exhibits no distension.  Musculoskeletal: Normal range of motion. He exhibits  tenderness. He exhibits no edema.  Mild TTP over right 4th and 5th MCP joint. No deformity, swelling, erythema, warmth or wounds/injury noted. FROM of right digits, wrist and elbow. Sensation grossly intact. 2+ radial pulse. Cap refill less than 2. Negative Tinel sign. Positive Phalen.  Neurological: He is alert and oriented to person, place, and time.  Skin: Skin is warm and dry.  Nursing note and vitals reviewed.   ED Course  Procedures (including critical care time) Labs Review Labs Reviewed - No  data to display  Imaging Review No results found. I have personally reviewed and evaluated these images and lab results as part of my medical decision-making.  Filed Vitals:   07/19/15 1002 07/19/15 1057  BP: 148/97   Pulse: 94 85  Temp: 97.4 F (36.3 C)   Resp: 17      MDM   Final diagnoses:  Carpal tunnel syndrome of right wrist    Patient presents worsening right hand and wrist pain, denies any recent fall, trauma, injury. Exam revealed tenderness over right fourth and fifth MCP joint, positive Phalen test, right hand/arm otherwise neurovascular intact. Presentation is consistent with carpal tunnel syndrome. No bony abnormality or deformity, no erythema or excessive heat, no evidence of cellulitis or septic joint, I doubt feel any further workup or imaging is warranted at this time. Plan to discharge patient home with wrist splint, NSAIDs and discussed symptomatic treatment. Patient given resource guide to follow up with PCP.  Evaluation does not show pathology requring ongoing emergent intervention or admission. Pt is hemodynamically stable and mentating appropriately. Discussed findings/results and plan with patient/guardian, who agrees with plan. All questions answered. Return precautions discussed and outpatient follow up given.       Satira Sark [place], [place] 07/19/15 1141  Cathren Laine, MD 07/20/15 [phone]

## 2015-07-19 NOTE — ED Notes (Signed)
Pt reports a two week hx of recurrent r/wrist pain. C/o sharp shooting pain in fingers of r/hand. Swelling noted in r/hand

## 2015-07-19 NOTE — Discharge Instructions (Signed)
Take your medications as prescribed as needed for pain relief. Recommend refraining from doing any activity that exacerbates her symptoms. Wear your wrist splint at night daily. You may also wear the splint during the day for symptomatic relief as needed. I recommend continuing to take 800 mg ibuprofen 3 times daily. He may also apply ice to affected wrist for 15-20 minutes 3-4 times daily to help with pain and swelling. Follow-up with your orthopedist, Dr. August Saucer, within the next 1-2 weeks. Return to the emergency department if symptoms worsen or new onset of fever, decreased range of motion, redness, swelling, warmth, numbness, tingling, weakness.

## 2015-09-06 ENCOUNTER — Encounter (HOSPITAL_COMMUNITY): Payer: Self-pay

## 2015-09-06 ENCOUNTER — Emergency Department (HOSPITAL_COMMUNITY)
Admission: EM | Admit: 2015-09-06 | Discharge: 2015-09-06 | Disposition: A | Discharge status: Home / Self Care | Payer: 59 | Attending: Emergency Medicine | Admitting: Emergency Medicine

## 2015-09-06 DIAGNOSIS — Z87438 Personal history of other diseases of male genital organs: Secondary | ICD-10-CM | POA: Insufficient documentation

## 2015-09-06 DIAGNOSIS — Z87891 Personal history of nicotine dependence: Secondary | ICD-10-CM | POA: Diagnosis not present

## 2015-09-06 DIAGNOSIS — M549 Dorsalgia, unspecified: Secondary | ICD-10-CM

## 2015-09-06 DIAGNOSIS — W1849XA Other slipping, tripping and stumbling without falling, initial encounter: Secondary | ICD-10-CM | POA: Diagnosis not present

## 2015-09-06 DIAGNOSIS — Z791 Long term (current) use of non-steroidal anti-inflammatories (NSAID): Secondary | ICD-10-CM | POA: Insufficient documentation

## 2015-09-06 DIAGNOSIS — Y9289 Other specified places as the place of occurrence of the external cause: Secondary | ICD-10-CM | POA: Insufficient documentation

## 2015-09-06 DIAGNOSIS — S3992XA Unspecified injury of lower back, initial encounter: Secondary | ICD-10-CM | POA: Diagnosis present

## 2015-09-06 DIAGNOSIS — Y9389 Activity, other specified: Secondary | ICD-10-CM | POA: Diagnosis not present

## 2015-09-06 DIAGNOSIS — Y998 Other external cause status: Secondary | ICD-10-CM | POA: Diagnosis not present

## 2015-09-06 DIAGNOSIS — Z880 Allergy status to penicillin: Secondary | ICD-10-CM | POA: Insufficient documentation

## 2015-09-06 MED ORDER — IBUPROFEN 800 MG PO TABS: 800.0000 mg | ORAL_TABLET | Freq: Three times a day (TID) | ORAL | Status: DC

## 2015-09-06 MED ORDER — TRAMADOL HCL 50 MG PO TABS: 50.0000 mg | ORAL_TABLET | Freq: Four times a day (QID) | ORAL | Status: DC | PRN

## 2015-09-06 MED ORDER — METHOCARBAMOL 500 MG PO TABS: 500.0000 mg | ORAL_TABLET | Freq: Two times a day (BID) | ORAL | Status: DC

## 2015-09-06 NOTE — ED Provider Notes (Signed)
CSN: [id number]     Arrival date & time 09/06/15  [id number] History   First MD Initiated Contact with Patient 09/06/15 0932     Chief Complaint  Patient presents with  . Back Pain    HPI   Mike Watkins is a 50 y.o. male with a PMH of arthritis who presents to the ED with right sided low back pain, which he states started 1 week ago after he tripped over his dog and twisted. He reports intermittent pain since that time. He states moving certain ways exacerbates his pain. He has tried his wife's percocet with no significant symptom relief. He denies fever, chills, abdominal pain, N/V, bowel or bladder incontinence, saddle anesthesia, numbness, weakness, paresthesia, history of malignancy, IVDU, anticoagulant use.   Past Medical History  Diagnosis Date  . Penile lesion   . Arthritis    Past Surgical History  Procedure Laterality Date  . Anterior cervical decomp/discectomy fusion  12-31-2002    C5 -- C7  . Posterior fusion cervical spine  02-26-2005    C5 -- C7  . Penile biopsy N/A 08/23/2012    Procedure: EXCISIONAL BIOPSY OF PENILE LESION;  Surgeon: [name], MD;  [facility];  Service: Urology;  Laterality: N/A;  EXCISIONAL BIOPSY OF PENILE LESION     Family History  Problem Relation Age of Onset  . Hypertension Mother   . Diabetes Mother   . Hypertension Father   . Cancer Father    Social History  Substance Use Topics  . Smoking status: Former Smoker -- 3.00 packs/day for 21 years    Types: Cigarettes    Quit date: 08/18/2002  . Smokeless tobacco: Former NeurosurgeonUser  . Alcohol Use: Yes     Comment: RARE      Review of Systems  Constitutional: Negative for fever and chills.  Gastrointestinal: Negative for nausea, vomiting and abdominal pain.  Musculoskeletal: Positive for back pain.  Neurological: Negative for weakness and numbness.      Allergies  Sulfa antibiotics and Penicillins  Home Medications   Prior to Admission  medications   Medication Sig Start Date End Date Taking? Authorizing Provider  HYDROcodone-acetaminophen (NORCO/VICODIN) 5-325 MG tablet Take 2 tablets by mouth every 4 (four) hours as needed. 07/19/15   Barrett HenleNicole Elizabeth Nadeau, PA-C  ibuprofen (ADVIL,MOTRIN) 800 MG tablet Take 1 tablet (800 mg total) by mouth 3 (three) times daily. 09/06/15   Mady GemmaElizabeth C Westfall, PA-C  methocarbamol (ROBAXIN) 500 MG tablet Take 1 tablet (500 mg total) by mouth 2 (two) times daily. 09/06/15   Mady GemmaElizabeth C Westfall, PA-C  naproxen (NAPROSYN) 500 MG tablet Take 1 tablet (500 mg total) by mouth 2 (two) times daily. 05/02/15   Linwood DibblesJon Knapp, MD  traMADol (ULTRAM) 50 MG tablet Take 1 tablet (50 mg total) by mouth every 6 (six) hours as needed. 09/06/15   Mady GemmaElizabeth C Westfall, PA-C    BP 147/94 mmHg  Pulse 84  Temp(Src) 97.9 F (36.6 C) (Oral)  Resp 20  SpO2 100% Physical Exam  Constitutional: He is oriented to person, place, and time. He appears well-developed and well-nourished. No distress.  HENT:  Head: Normocephalic and atraumatic.  Right Ear: External ear normal.  Left Ear: External ear normal.  Nose: Nose normal.  Eyes: Conjunctivae and EOM are normal. Right eye exhibits no discharge. Left eye exhibits no discharge. No scleral icterus.  Neck: Normal range of motion. Neck supple.  Cardiovascular: Normal rate, regular rhythm and intact distal pulses.  Pulmonary/Chest: Effort normal and breath sounds normal. No respiratory distress.  Abdominal: Soft. He exhibits no distension and no mass. There is no tenderness. There is no rebound and no guarding.  Musculoskeletal: Normal range of motion. He exhibits tenderness. He exhibits no edema.  Mild TTP to right gluteal area. No midline lumbar tenderness, step-off, or deformity.  Neurological: He is alert and oriented to person, place, and time. He has normal reflexes.  Skin: Skin is warm and dry. He is not diaphoretic.  Psychiatric: He has a normal mood and affect. His  behavior is normal.  Nursing note and vitals reviewed.   ED Course  Procedures (including critical care time)  Labs Review Labs Reviewed - No data to display  Imaging Review No results found.    EKG Interpretation None      MDM   Final diagnoses:  Back pain, unspecified location    50 year old male presents with right sided low back pain after tripping over his dog and twisting 1 week ago. Denies fever, chills, abdominal pain, N/V, bowel or bladder incontinence, saddle anesthesia, numbness, weakness, paresthesia, history of malignancy, IVDU, anticoagulant use. Patient is afebrile. Vital signs stable. On exam, patient has mild TTP to his right gluteal area. No midline lumbar tenderness, step-off, or deformity. Strength, sensation, DTRs intact. Patient ambulates without difficulty. Symptoms likely muscular. Doubt cauda equina, abscess, hematoma. Will give anti-inflammatory and muscle relaxant. Patient to follow-up with PCP. Return precautions discussed. Patient verbalizes his understanding and is in agreement with plan.  BP 147/94 mmHg  Pulse 84  Temp(Src) 97.9 F (36.6 C) (Oral)  Resp 20  SpO2 100%       Mady Gemma, PA-C 09/06/15 1509  Melene Plan, DO 09/06/15 1538

## 2015-09-06 NOTE — ED Notes (Signed)
Pt with lower back pain radiating down right leg x 1 week.  Remembers twisting but no specific injury.

## 2015-09-06 NOTE — Discharge Instructions (Signed)
1. Medications: ibuprofen, robaxin, usual home medications 2. Treatment: rest, drink plenty of fluids 3. Follow Up: please followup with your primary doctor for discussion of your diagnoses and further evaluation after today's visit; if you do not have a primary care doctor use the phone number listed in your discharge paperwork to find one; please return to the ER for increased pain, numbness, weakness, loss of control of your bowel or bladder, new or worsening symptoms   Back Pain, Adult Back pain is very common in adults.The cause of back pain is rarely dangerous and the pain often gets better over time.The cause of your back pain may not be known. Some common causes of back pain include:  Strain of the muscles or ligaments supporting the spine.  Wear and tear (degeneration) of the spinal disks.  Arthritis.  Direct injury to the back. For many people, back pain may return. Since back pain is rarely dangerous, most people can learn to manage this condition on their own. HOME CARE INSTRUCTIONS Watch your back pain for any changes. The following actions may help to lessen any discomfort you are feeling:  Remain active. It is stressful on your back to sit or stand in one place for long periods of time. Do not sit, drive, or stand in one place for more than 30 minutes at a time. Take short walks on even surfaces as soon as you are able.Try to increase the length of time you walk each day.  Exercise regularly as directed by your health care provider. Exercise helps your back heal faster. It also helps avoid future injury by keeping your muscles strong and flexible.  Do not stay in bed.Resting more than 1-2 days can delay your recovery.  Pay attention to your body when you bend and lift. The most comfortable positions are those that put less stress on your recovering back. Always use proper lifting techniques, including:  Bending your knees.  Keeping the load close to your  body.  Avoiding twisting.  Find a comfortable position to sleep. Use a firm mattress and lie on your side with your knees slightly bent. If you lie on your back, put a pillow under your knees.  Avoid feeling anxious or stressed.Stress increases muscle tension and can worsen back pain.It is important to recognize when you are anxious or stressed and learn ways to manage it, such as with exercise.  Take medicines only as directed by your health care provider. Over-the-counter medicines to reduce pain and inflammation are often the most helpful.Your health care provider may prescribe muscle relaxant drugs.These medicines help dull your pain so you can more quickly return to your normal activities and healthy exercise.  Apply ice to the injured area:  Put ice in a plastic bag.  Place a towel between your skin and the bag.  Leave the ice on for 20 minutes, 2-3 times a day for the first 2-3 days. After that, ice and heat may be alternated to reduce pain and spasms.  Maintain a healthy weight. Excess weight puts extra stress on your back and makes it difficult to maintain good posture. SEEK MEDICAL CARE IF:  You have pain that is not relieved with rest or medicine.  You have increasing pain going down into the legs or buttocks.  You have pain that does not improve in one week.  You have night pain.  You lose weight.  You have a fever or chills. SEEK IMMEDIATE MEDICAL CARE IF:   You develop new bowel  or bladder control problems.  You have unusual weakness or numbness in your arms or legs.  You develop nausea or vomiting.  You develop abdominal pain.  You feel faint.   This information is not intended to replace advice given to you by your health care provider. Make sure you discuss any questions you have with your health care provider.   Document Released: 05/31/2005 Document Revised: 06/21/2014 Document Reviewed: 10/02/2013 Elsevier Interactive Patient Education [id number]  [employer]

## 2015-09-13 ENCOUNTER — Emergency Department (HOSPITAL_COMMUNITY)
Admission: EM | Admit: 2015-09-13 | Discharge: 2015-09-13 | Disposition: A | Discharge status: Home / Self Care | Payer: 59 | Attending: Emergency Medicine | Admitting: Emergency Medicine

## 2015-09-13 ENCOUNTER — Encounter (HOSPITAL_COMMUNITY): Payer: Self-pay | Admitting: Emergency Medicine

## 2015-09-13 ENCOUNTER — Emergency Department (HOSPITAL_COMMUNITY): Payer: 59

## 2015-09-13 DIAGNOSIS — Z880 Allergy status to penicillin: Secondary | ICD-10-CM | POA: Insufficient documentation

## 2015-09-13 DIAGNOSIS — Z87891 Personal history of nicotine dependence: Secondary | ICD-10-CM | POA: Diagnosis not present

## 2015-09-13 DIAGNOSIS — Z791 Long term (current) use of non-steroidal anti-inflammatories (NSAID): Secondary | ICD-10-CM | POA: Insufficient documentation

## 2015-09-13 DIAGNOSIS — M1990 Unspecified osteoarthritis, unspecified site: Secondary | ICD-10-CM | POA: Diagnosis not present

## 2015-09-13 DIAGNOSIS — Z87438 Personal history of other diseases of male genital organs: Secondary | ICD-10-CM | POA: Diagnosis not present

## 2015-09-13 DIAGNOSIS — S29001A Unspecified injury of muscle and tendon of front wall of thorax, initial encounter: Secondary | ICD-10-CM | POA: Diagnosis present

## 2015-09-13 DIAGNOSIS — Y9301 Activity, walking, marching and hiking: Secondary | ICD-10-CM | POA: Insufficient documentation

## 2015-09-13 DIAGNOSIS — W01198A Fall on same level from slipping, tripping and stumbling with subsequent striking against other object, initial encounter: Secondary | ICD-10-CM | POA: Insufficient documentation

## 2015-09-13 DIAGNOSIS — Y9289 Other specified places as the place of occurrence of the external cause: Secondary | ICD-10-CM | POA: Diagnosis not present

## 2015-09-13 DIAGNOSIS — Z79899 Other long term (current) drug therapy: Secondary | ICD-10-CM | POA: Diagnosis not present

## 2015-09-13 DIAGNOSIS — Y998 Other external cause status: Secondary | ICD-10-CM | POA: Insufficient documentation

## 2015-09-13 DIAGNOSIS — S20211A Contusion of right front wall of thorax, initial encounter: Secondary | ICD-10-CM | POA: Diagnosis not present

## 2015-09-13 MED ORDER — NAPROXEN 500 MG PO TABS: 500.0000 mg | ORAL_TABLET | Freq: Two times a day (BID) | ORAL | Status: DC

## 2015-09-13 MED ORDER — OXYCODONE-ACETAMINOPHEN 5-325 MG PO TABS: 2.0000 | ORAL_TABLET | ORAL | Status: DC | PRN

## 2015-09-13 NOTE — ED Notes (Signed)
Patient transported to X-ray 

## 2015-09-13 NOTE — ED Provider Notes (Signed)
CSN: [id number]     Arrival date & time 09/13/15  0850 History   First MD Initiated Contact with Patient 09/13/15 [phone]     Chief Complaint  Patient presents with  . rib pain after falling into hand rail       HPI  Patient Presents for evaluation of right chest pain. He was walking on his deck yesterday. States it was slick. He slipped. He was holding onto the right guardrail. He attempted to hold onto both hands. He fell awkwardly. States the guardrail struck him in his right lower anterior ribs. Also states that he has had sciatica last week, was given tramadol, and it's "not helping".  Past Medical History  Diagnosis Date  . Penile lesion   . Arthritis    Past Surgical History  Procedure Laterality Date  . Anterior cervical decomp/discectomy fusion  12-31-2002    C5 -- C7  . Posterior fusion cervical spine  02-26-2005    C5 -- C7  . Penile biopsy N/A 08/23/2012    Procedure: EXCISIONAL BIOPSY OF PENILE LESION;  Surgeon: Milford Cage, MD;  Location: [facility];  Service: Urology;  Laterality: N/A;  EXCISIONAL BIOPSY OF PENILE LESION     Family History  Problem Relation Age of Onset  . Hypertension Mother   . Diabetes Mother   . Hypertension Father   . Cancer Father    Social History  Substance Use Topics  . Smoking status: Former Smoker -- 3.00 packs/day for 21 years    Types: Cigarettes    Quit date: 08/18/2002  . Smokeless tobacco: Former Neurosurgeon  . Alcohol Use: Yes     Comment: RARE    Review of Systems  Constitutional: Negative for fever, chills, diaphoresis, appetite change and fatigue.  HENT: Negative for mouth sores, sore throat and trouble swallowing.   Eyes: Negative for visual disturbance.  Respiratory: Negative for cough, chest tightness, shortness of breath and wheezing.   Cardiovascular: Positive for chest pain.  Gastrointestinal: Negative for nausea, vomiting, abdominal pain, diarrhea and abdominal distention.  Endocrine:  Negative for polydipsia, polyphagia and polyuria.  Genitourinary: Negative for dysuria, frequency and hematuria.  Musculoskeletal: Negative for gait problem.  Skin: Negative for color change, pallor and rash.  Neurological: Negative for dizziness, syncope, light-headedness and headaches.  Hematological: Does not bruise/bleed easily.  Psychiatric/Behavioral: Negative for behavioral problems and confusion.      Allergies  Penicillins and Sulfa antibiotics  Home Medications   Prior to Admission medications   Medication Sig Start Date End Date Taking? Authorizing Provider  ibuprofen (ADVIL,MOTRIN) 800 MG tablet Take 1 tablet (800 mg total) by mouth 3 (three) times daily. 09/06/15  Yes Mady Gemma, PA-C  methocarbamol (ROBAXIN) 500 MG tablet Take 1 tablet (500 mg total) by mouth 2 (two) times daily. 09/06/15  Yes Mady Gemma, PA-C  traMADol (ULTRAM) 50 MG tablet Take 1 tablet (50 mg total) by mouth every 6 (six) hours as needed. 09/06/15  Yes Mady Gemma, PA-C  HYDROcodone-acetaminophen (NORCO/VICODIN) 5-325 MG tablet Take 2 tablets by mouth every 4 (four) hours as needed. Patient not taking: Reported on 09/13/2015 07/19/15   Barrett Henle, PA-C  naproxen (NAPROSYN) 500 MG tablet Take 1 tablet (500 mg total) by mouth 2 (two) times daily. 09/13/15   Rolland Porter, MD  oxyCODONE-acetaminophen (PERCOCET/ROXICET) 5-325 MG tablet Take 2 tablets by mouth every 4 (four) hours as needed. 09/13/15   Rolland Porter, MD   BP 151/106 mmHg  Pulse 93  Temp(Src) 98 F (36.7 C) (Oral)  Resp 20  SpO2 96% Physical Exam  Constitutional: He is oriented to person, place, and time. He appears well-developed and well-nourished. No distress.  HENT:  Head: Normocephalic.  Eyes: Conjunctivae are normal. Pupils are equal, round, and reactive to light. No scleral icterus.  Neck: Normal range of motion. Neck supple. No thyromegaly present.  Cardiovascular: Normal rate and regular rhythm.  Exam  reveals no gallop and no friction rub.   No murmur heard. Pulmonary/Chest: Effort normal and breath sounds normal. No respiratory distress. He has no wheezes. He has no rales.    Tenderness. No crepitus. No visible bruising. No palpable subcutaneous air the neck or chest. Symmetric breath sounds.  Abdominal: Soft. Bowel sounds are normal. He exhibits no distension. There is no tenderness. There is no rebound.  Musculoskeletal: Normal range of motion.  Neurological: He is alert and oriented to person, place, and time.  Skin: Skin is warm and dry. No rash noted.  Psychiatric: He has a normal mood and affect. His behavior is normal.    ED Course  Procedures (including critical care time) Labs Review Labs Reviewed - No data to display  Imaging Review Dg Ribs Unilateral W/chest Right  09/13/2015  CLINICAL DATA:  Acute right rib pain after fall at home yesterday. EXAM: RIGHT RIBS AND CHEST - 3+ VIEW COMPARISON:  May 02, 2015. FINDINGS: No fracture or other bone lesions are seen involving the ribs. There is no evidence of pneumothorax or pleural effusion. Both lungs are clear. Heart size and mediastinal contours are within normal limits. IMPRESSION: Normal right ribs.  No acute cardiopulmonary abnormality seen. Electronically Signed   By: Lupita RaiderJames  Green Jr, M.D.   On: 09/13/2015 09:29   I have personally reviewed and evaluated these images and lab results as part of my medical decision-making.   EKG Interpretation None      MDM   Final diagnoses:  Chest wall contusion, right, initial encounter    X-ray show no fracture or pneumothorax or contusion. Plan symptom control, cough and deep breathe, pulmonary toilet.    Rolland PorterMark Castlewood Wenzlick, MD 09/13/15 [phone]

## 2015-09-13 NOTE — ED Notes (Signed)
Pt reports he lost his footing yesterday morning, which caused him to stumble into the railing hitting his R ribcage. Pt reports he has R rib pain worse with taking a deep breath.

## 2015-09-13 NOTE — ED Notes (Signed)
Awake. Verbally responsive. A/O x4. Resp even and unlabored. No audible adventitious breath sounds noted. ABC's intact.  

## 2015-09-13 NOTE — Discharge Instructions (Signed)
Chest Contusion °A contusion is a deep bruise. Bruises happen when an injury causes bleeding under the skin. Signs of bruising include pain, puffiness (swelling), and discolored skin. The bruise may turn blue, purple, or yellow.  °HOME CARE °· Put ice on the injured area. °¨ Put ice in a plastic bag. °¨ Place a towel between the skin and the bag. °¨ Leave the ice on for 15-20 minutes at a time, 03-04 times a day for the first 48 hours. °· Only take medicine as told by your doctor. °· Rest. °· Take deep breaths (deep-breathing exercises) as told by your doctor. °· Stop smoking if you smoke. °· Do not lift objects over 5 pounds (2.3 kilograms) for 3 days or longer if told by your doctor. °GET HELP RIGHT AWAY IF:  °· You have more bruising or puffiness. °· You have pain that gets worse. °· You have trouble breathing. °· You are dizzy, weak, or pass out (faint). °· You have blood in your pee (urine) or poop (stool). °· You cough up or throw up (vomit) blood. °· Your puffiness or pain is not helped with medicines. °MAKE SURE YOU:  °· Understand these instructions. °· Will watch your condition. °· Will get help right away if you are not doing well or get worse. °  °This information is not intended to replace advice given to you by your health care provider. Make sure you discuss any questions you have with your health care provider. °  °Document Released: 11/17/2007 Document Revised: 02/23/2012 Document Reviewed: 11/22/2011 °Elsevier Interactive Patient Education ©2016 Elsevier Inc. ° °

## 2015-10-01 ENCOUNTER — Encounter (HOSPITAL_COMMUNITY): Payer: Self-pay | Admitting: Emergency Medicine

## 2015-10-01 ENCOUNTER — Emergency Department (HOSPITAL_COMMUNITY)
Admission: EM | Admit: 2015-10-01 | Discharge: 2015-10-01 | Disposition: A | Discharge status: Home / Self Care | Payer: 59 | Attending: Emergency Medicine | Admitting: Emergency Medicine

## 2015-10-01 ENCOUNTER — Emergency Department (HOSPITAL_COMMUNITY): Payer: 59

## 2015-10-01 DIAGNOSIS — Z87891 Personal history of nicotine dependence: Secondary | ICD-10-CM | POA: Insufficient documentation

## 2015-10-01 DIAGNOSIS — Y9289 Other specified places as the place of occurrence of the external cause: Secondary | ICD-10-CM | POA: Diagnosis not present

## 2015-10-01 DIAGNOSIS — Y9389 Activity, other specified: Secondary | ICD-10-CM | POA: Insufficient documentation

## 2015-10-01 DIAGNOSIS — Z79899 Other long term (current) drug therapy: Secondary | ICD-10-CM | POA: Insufficient documentation

## 2015-10-01 DIAGNOSIS — W1840XA Slipping, tripping and stumbling without falling, unspecified, initial encounter: Secondary | ICD-10-CM | POA: Insufficient documentation

## 2015-10-01 DIAGNOSIS — S4991XA Unspecified injury of right shoulder and upper arm, initial encounter: Secondary | ICD-10-CM | POA: Diagnosis present

## 2015-10-01 DIAGNOSIS — M1990 Unspecified osteoarthritis, unspecified site: Secondary | ICD-10-CM | POA: Insufficient documentation

## 2015-10-01 DIAGNOSIS — Z87438 Personal history of other diseases of male genital organs: Secondary | ICD-10-CM | POA: Insufficient documentation

## 2015-10-01 DIAGNOSIS — Y998 Other external cause status: Secondary | ICD-10-CM | POA: Diagnosis not present

## 2015-10-01 DIAGNOSIS — Z880 Allergy status to penicillin: Secondary | ICD-10-CM | POA: Insufficient documentation

## 2015-10-01 DIAGNOSIS — S46911A Strain of unspecified muscle, fascia and tendon at shoulder and upper arm level, right arm, initial encounter: Secondary | ICD-10-CM | POA: Diagnosis not present

## 2015-10-01 MED ORDER — IBUPROFEN 800 MG PO TABS: 800.0000 mg | ORAL_TABLET | Freq: Three times a day (TID) | ORAL | Status: DC

## 2015-10-01 MED ORDER — KETOROLAC TROMETHAMINE 60 MG/2ML IM SOLN
60.0000 mg | Freq: Once | INTRAMUSCULAR | Status: DC
  Filled 2015-10-01: qty 2

## 2015-10-01 MED ORDER — DEXAMETHASONE SODIUM PHOSPHATE 10 MG/ML IJ SOLN
10.0000 mg | Freq: Once | INTRAMUSCULAR | Status: DC
  Filled 2015-10-01: qty 1

## 2015-10-01 MED ORDER — HYDROCODONE-ACETAMINOPHEN 5-325 MG PO TABS: 1.0000 | ORAL_TABLET | Freq: Four times a day (QID) | ORAL | Status: DC | PRN

## 2015-10-01 NOTE — ED Notes (Signed)
Pt refused arm sling.

## 2015-10-01 NOTE — ED Provider Notes (Signed)
History  By signing my name below, I, Karle Plumber, attest that this documentation has been prepared under the direction and in the presence of Danelle Berry, PA-C. Electronically Signed: Karle Plumber, ED Scribe. 10/01/2015. 5:03 PM.  Chief Complaint  Patient presents with  . Shoulder Injury    right   The history is provided by the patient and medical records. No language interpreter was used.    HPI Comments:  Mike Watkins is a 50 y.o. male who presents to the Emergency Department complaining of slipping down stairs last night and catching himself with his right arm. He reports some right shoulder pain. He reports numbness of the right hand but states that is normal at baseline secondary to a previous neck surgery. He has taken two Percocet he got from his wife with no significant relief of the pain. Flexion of  RUE more than 90 degrees increases the pain.  He denies alleviating factors. He denies new or changed numbness, tingling or weakness of the RUE, bruising, wounds, fever, chills, swelling, redness, warmth, nausea or vomiting. He denies actually falling to the ground, head injury or LOC.   Past Medical History  Diagnosis Date  . Penile lesion   . Arthritis    Past Surgical History  Procedure Laterality Date  . Anterior cervical decomp/discectomy fusion  12-31-2002    C5 -- C7  . Posterior fusion cervical spine  02-26-2005    C5 -- C7  . Penile biopsy N/A 08/23/2012    Procedure: EXCISIONAL BIOPSY OF PENILE LESION;  Surgeon: Milford Cage, MD;  Location: [facility];  Service: Urology;  Laterality: N/A;  EXCISIONAL BIOPSY OF PENILE LESION     Family History  Problem Relation Age of Onset  . Hypertension Mother   . Diabetes Mother   . Hypertension Father   . Cancer Father    Social History  Substance Use Topics  . Smoking status: Former Smoker -- 3.00 packs/day for 21 years    Types: Cigarettes    Quit date: 08/18/2002  . Smokeless  tobacco: Former Neurosurgeon  . Alcohol Use: Yes     Comment: RARE    Review of Systems  Musculoskeletal: Positive for myalgias.  All other systems reviewed and are negative.   Allergies  Penicillins and Sulfa antibiotics  Home Medications   Prior to Admission medications   Medication Sig Start Date End Date Taking? Authorizing Provider  methocarbamol (ROBAXIN) 500 MG tablet Take 1 tablet (500 mg total) by mouth 2 (two) times daily. 09/06/15  Yes Mady Gemma, PA-C  oxyCODONE-acetaminophen (PERCOCET) 10-325 MG tablet Take 1 tablet by mouth once.   Yes Historical Provider, MD  HYDROcodone-acetaminophen (NORCO/VICODIN) 5-325 MG tablet Take 1-2 tablets by mouth every 6 (six) hours as needed for severe pain. 10/01/15   Danelle Berry, PA-C  ibuprofen (ADVIL,MOTRIN) 800 MG tablet Take 1 tablet (800 mg total) by mouth 3 (three) times daily. 10/01/15   Danelle Berry, PA-C  naproxen (NAPROSYN) 500 MG tablet Take 1 tablet (500 mg total) by mouth 2 (two) times daily. Patient not taking: Reported on 10/01/2015 09/13/15   Rolland Porter, MD  oxyCODONE-acetaminophen (PERCOCET/ROXICET) 5-325 MG tablet Take 2 tablets by mouth every 4 (four) hours as needed. Patient not taking: Reported on 10/01/2015 09/13/15   Rolland Porter, MD  traMADol (ULTRAM) 50 MG tablet Take 1 tablet (50 mg total) by mouth every 6 (six) hours as needed. Patient not taking: Reported on 10/01/2015 09/06/15   Mady Gemma,  PA-C   Triage Vitals: BP 149/79 mmHg  Pulse 109  Temp(Src) 97.9 F (36.6 C) (Oral)  Resp 18  SpO2 96% Physical Exam  Constitutional: He is oriented to person, place, and time. He appears well-developed and well-nourished. No distress.  HENT:  Head: Normocephalic and atraumatic.  Right Ear: External ear normal.  Left Ear: External ear normal.  Nose: Nose normal.  Mouth/Throat: Oropharynx is clear and moist. No oropharyngeal exudate.  Eyes: Conjunctivae and EOM are normal. Pupils are equal, round, and reactive to  light. Right eye exhibits no discharge. Left eye exhibits no discharge. No scleral icterus.  Neck: Normal range of motion. Neck supple. No JVD present. No tracheal deviation present.  Cardiovascular: Normal rate and regular rhythm.   Pulmonary/Chest: Effort normal and breath sounds normal. No stridor. No respiratory distress.  Musculoskeletal: He exhibits tenderness. He exhibits no edema.       Right shoulder: He exhibits decreased range of motion, tenderness and bony tenderness. He exhibits no swelling, no effusion, normal pulse and normal strength.  Normal passive ROM of right shoulder. Decreases active ROM of right shoulder. Positive drop test. Positive open can test. TTP to glenohumeral fossa, no AC tenderness, no clavicular tenderness, no Addison ttp. Normal radial pulse 2+ Decreased sensation to light touch in right 1-3rd fingers (chronic from prior neck surgery)  Lymphadenopathy:    He has no cervical adenopathy.  Neurological: He is alert and oriented to person, place, and time. He exhibits normal muscle tone. Coordination normal.  Skin: Skin is warm and dry. No rash noted. He is not diaphoretic. No erythema. No pallor.  Psychiatric: He has a normal mood and affect. His behavior is normal. Judgment and thought content normal.  Nursing note and vitals reviewed.   ED Course  Procedures (including critical care time) DIAGNOSTIC STUDIES: Oxygen Saturation is 96% on RA, adequate by my interpretation.   COORDINATION OF CARE: 4:30 PM- Will wait for right shoulder X-Ray to result. Pt verbalizes understanding and agrees to plan.  Medications  ketorolac (TORADOL) injection 60 mg (60 mg Intramuscular Not Given 10/01/15 1719)  dexamethasone (DECADRON) injection 10 mg (10 mg Intramuscular Not Given 10/01/15 1719)    Labs Review Labs Reviewed - No data to display  Imaging Review Dg Shoulder Right  10/01/2015  CLINICAL DATA:  Larey SeatFell:  Down steps, now with right shoulder pain EXAM: RIGHT SHOULDER  - 2+ VIEW COMPARISON:  None. FINDINGS: The right humeral head is in normal position and the glenohumeral joint space is unremarkable. The [facility] is normally aligned. No acute fracture is seen. A lower cervical spine anterior fusion plate is present. IMPRESSION: No acute abnormality. Electronically Signed   By: Dwyane DeePaul  Barry M.D.   On: 10/01/2015 16:53   I have personally reviewed and evaluated these images and lab results as part of my medical decision-making.   EKG Interpretation None      MDM   Right shoulder strain injury last night. PE shows no instability, Xray negative for fx or dislocation. Decreased strength with empty can test.  Advised sling immobilizer, which was refused, RICE tx, ortho follow up.    Pt d/c in good condition, VSS.    Final diagnoses:  Right shoulder strain, initial encounter   I personally performed the services described in this documentation, which was scribed in my presence. The recorded information has been reviewed and is accurate.       Danelle BerryLeisa Shalia Bartko, PA-C 10/01/15 2008  Loren Raceravid Yelverton, MD 10/04/15 [phone]

## 2015-10-01 NOTE — ED Notes (Signed)
Pt reports going down steps, caught himself before fall, got right arm pulled back. Co right shoulder pain . Pt able to move right arm yet painful. No further injury . denies fall nor loc nor head injury.

## 2015-10-01 NOTE — ED Notes (Signed)
PA at bedside.

## 2015-10-01 NOTE — Discharge Instructions (Signed)
Muscle Strain A muscle strain is an injury that occurs when a muscle is stretched beyond its normal length. Usually a small number of muscle fibers are torn when this happens. Muscle strain is rated in degrees. First-degree strains have the least amount of muscle fiber tearing and pain. Second-degree and third-degree strains have increasingly more tearing and pain.  Usually, recovery from muscle strain takes 1-2 weeks. Complete healing takes 5-6 weeks.  CAUSES  Muscle strain happens when a sudden, violent force placed on a muscle stretches it too far. This may occur with lifting, sports, or a fall.  RISK FACTORS Muscle strain is especially common in athletes.  SIGNS AND SYMPTOMS At the site of the muscle strain, there may be:  Pain.  Bruising.  Swelling.  Difficulty using the muscle due to pain or lack of normal function. DIAGNOSIS  Your health care provider will perform a physical exam and ask about your medical history. TREATMENT  Often, the best treatment for a muscle strain is resting, icing, and applying cold compresses to the injured area.  HOME CARE INSTRUCTIONS   Use the PRICE method of treatment to promote muscle healing during the first 2-3 days after your injury. The PRICE method involves:  Protecting the muscle from being injured again.  Restricting your activity and resting the injured body part.  Icing your injury. To do this, put ice in a plastic bag. Place a towel between your skin and the bag. Then, apply the ice and leave it on from 15-20 minutes each hour. After the third day, switch to moist heat packs.  Apply compression to the injured area with a splint or elastic bandage. Be careful not to wrap it too tightly. This may interfere with blood circulation or increase swelling.  Elevate the injured body part above the level of your heart as often as you can.  Only take over-the-counter or prescription medicines for pain, discomfort, or fever as directed by your  health care provider.  Warming up prior to exercise helps to prevent future muscle strains. SEEK MEDICAL CARE IF:   You have increasing pain or swelling in the injured area.  You have numbness, tingling, or a significant loss of strength in the injured area. MAKE SURE YOU:   Understand these instructions.  Will watch your condition.  Will get help right away if you are not doing well or get worse.   This information is not intended to replace advice given to you by your health care provider. Make sure you discuss any questions you have with your health care provider.   Document Released: 05/31/2005 Document Revised: 03/21/2013 Document Reviewed: 12/28/2012 Elsevier Interactive Patient Education 2016 Elsevier Inc. Shoulder Sprain A shoulder sprain is a partial or complete tear in one of the tough, fiber-like tissues (ligaments) in the shoulder. The ligaments in the shoulder help to hold the shoulder in place. CAUSES This condition may be caused by:  A fall.  A hit to the shoulder.  A twist of the arm. RISK FACTORS This condition is more likely to develop in:  People who play sports.  People who have problems with balance or coordination. SYMPTOMS Symptoms of this condition include:  Pain when moving the shoulder.  Limited ability to move the shoulder.  Swelling and tenderness on top of the shoulder.  Warmth in the shoulder.  A change in the shape of the shoulder.  Redness or bruising on the shoulder. DIAGNOSIS This condition is diagnosed with a physical exam. During the exam,  you may be asked to do simple exercises with your shoulder. You may also have imaging tests, such as X-rays, MRI, or a CT scan. These tests can show how severe the sprain is. TREATMENT This condition may be treated with:  Rest.  Pain medicine.  Ice.  A sling or brace. This is used to keep the arm still while the shoulder is healing.  Physical therapy or rehabilitation exercises.  These help to improve the range of motion and strength of the shoulder.  Surgery (rare). Surgery may be needed if the sprain caused a joint to become unstable. Surgery may also be needed to reduce pain. Some people may develop ongoing shoulder pain or lose some range of motion in the shoulder. However, most people do not develop long-term problems. HOME CARE INSTRUCTIONS  Rest.  Take over-the-counter and prescription medicines only as told by your health care provider.  If directed, apply ice to the area:  Put ice in a plastic bag.  Place a towel between your skin and the bag.  Leave the ice on for 20 minutes, 2-3 times per day.  If you were given a shoulder sling or brace:  Wear it as told.  Remove it to shower or bathe.  Move your arm only as much as told by your health care provider, but keep your hand moving to prevent swelling.  If you were shown how to do any exercises, do them as told by your health care provider.  Keep all follow-up visits as told by your health care provider. This is important. SEEK MEDICAL CARE IF:  Your pain gets worse.  Your pain is not relieved with medicines.  You have increased redness or swelling. SEEK IMMEDIATE MEDICAL CARE IF:  You have a fever.  You cannot move your arm or shoulder.  You develop numbness or tingling in your arms, hands, or fingers.   This information is not intended to replace advice given to you by your health care provider. Make sure you discuss any questions you have with your health care provider.   Document Released: 10/17/2008 Document Revised: 02/19/2015 Document Reviewed: 09/23/2014 Elsevier Interactive Patient Education [employer]2016 Else[employer] Inc[employer]

## 2015-10-17 ENCOUNTER — Emergency Department (HOSPITAL_COMMUNITY)
Admission: EM | Admit: 2015-10-17 | Discharge: 2015-10-17 | Disposition: A | Discharge status: Home / Self Care | Payer: 59 | Attending: Emergency Medicine | Admitting: Emergency Medicine

## 2015-10-17 ENCOUNTER — Encounter (HOSPITAL_COMMUNITY): Payer: Self-pay | Admitting: Emergency Medicine

## 2015-10-17 DIAGNOSIS — Z79891 Long term (current) use of opiate analgesic: Secondary | ICD-10-CM | POA: Diagnosis not present

## 2015-10-17 DIAGNOSIS — M1990 Unspecified osteoarthritis, unspecified site: Secondary | ICD-10-CM | POA: Diagnosis not present

## 2015-10-17 DIAGNOSIS — Y939 Activity, unspecified: Secondary | ICD-10-CM | POA: Diagnosis not present

## 2015-10-17 DIAGNOSIS — Z791 Long term (current) use of non-steroidal anti-inflammatories (NSAID): Secondary | ICD-10-CM | POA: Insufficient documentation

## 2015-10-17 DIAGNOSIS — Y999 Unspecified external cause status: Secondary | ICD-10-CM | POA: Insufficient documentation

## 2015-10-17 DIAGNOSIS — Y9289 Other specified places as the place of occurrence of the external cause: Secondary | ICD-10-CM | POA: Diagnosis not present

## 2015-10-17 DIAGNOSIS — X509XXA Other and unspecified overexertion or strenuous movements or postures, initial encounter: Secondary | ICD-10-CM | POA: Insufficient documentation

## 2015-10-17 DIAGNOSIS — S161XXA Strain of muscle, fascia and tendon at neck level, initial encounter: Secondary | ICD-10-CM | POA: Insufficient documentation

## 2015-10-17 DIAGNOSIS — Z79899 Other long term (current) drug therapy: Secondary | ICD-10-CM | POA: Diagnosis not present

## 2015-10-17 DIAGNOSIS — M542 Cervicalgia: Secondary | ICD-10-CM | POA: Diagnosis present

## 2015-10-17 DIAGNOSIS — Z87891 Personal history of nicotine dependence: Secondary | ICD-10-CM | POA: Diagnosis not present

## 2015-10-17 NOTE — Discharge Instructions (Signed)
Cervical Strain and Sprain With Rehab  Cervical strain and sprain are injuries that commonly occur with "whiplash" injuries. Whiplash occurs when the neck is forcefully whipped backward or forward, such as during a motor vehicle accident or during contact sports. The muscles, ligaments, tendons, discs, and nerves of the neck are susceptible to injury when this occurs.  RISK FACTORS  Risk of having a whiplash injury increases if:  · Osteoarthritis of the spine.  · Situations that make head or neck accidents or trauma more likely.  · High-risk sports (football, rugby, wrestling, hockey, auto racing, gymnastics, diving, contact karate, or boxing).  · Poor strength and flexibility of the neck.  · Previous neck injury.  · Poor tackling technique.  · Improperly fitted or padded equipment.  SYMPTOMS   · Pain or stiffness in the front or back of neck or both.  · Symptoms may present immediately or up to 24 hours after injury.  · Dizziness, headache, nausea, and vomiting.  · Muscle spasm with soreness and stiffness in the neck.  · Tenderness and swelling at the injury site.  PREVENTION  · Learn and use proper technique (avoid tackling with the head, spearing, and head-butting; use proper falling techniques to avoid landing on the head).  · Warm up and stretch properly before activity.  · Maintain physical fitness:    Strength, flexibility, and endurance.    Cardiovascular fitness.  · Wear properly fitted and padded protective equipment, such as padded soft collars, for participation in contact sports.  PROGNOSIS   Recovery from cervical strain and sprain injuries is dependent on the extent of the injury. These injuries are usually curable in 1 week to 3 months with appropriate treatment.   RELATED COMPLICATIONS   · Temporary numbness and weakness may occur if the nerve roots are damaged, and this may persist until the nerve has completely healed.  · Chronic pain due to frequent recurrence of symptoms.  · Prolonged healing,  especially if activity is resumed too soon (before complete recovery).  TREATMENT   Treatment initially involves the use of ice and medication to help reduce pain and inflammation. It is also important to perform strengthening and stretching exercises and modify activities that worsen symptoms so the injury does not get worse. These exercises may be performed at home or with a therapist. For patients who experience severe symptoms, a soft, padded collar may be recommended to be worn around the neck.   Improving your posture may help reduce symptoms. Posture improvement includes pulling your chin and abdomen in while sitting or standing. If you are sitting, sit in a firm chair with your buttocks against the back of the chair. While sleeping, try replacing your pillow with a small towel rolled to 2 inches in diameter, or use a cervical pillow or soft cervical collar. Poor sleeping positions delay healing.   For patients with nerve root damage, which causes numbness or weakness, the use of a cervical traction apparatus may be recommended. Surgery is rarely necessary for these injuries. However, cervical strain and sprains that are present at birth (congenital) may require surgery.  MEDICATION   · If pain medication is necessary, nonsteroidal anti-inflammatory medications, such as aspirin and ibuprofen, or other minor pain relievers, such as acetaminophen, are often recommended.  · Do not take pain medication for 7 days before surgery.  · Prescription pain relievers may be given if deemed necessary by your caregiver. Use only as directed and only as much as you need.    HEAT AND COLD:   · Cold treatment (icing) relieves pain and reduces inflammation. Cold treatment should be applied for 10 to 15 minutes every 2 to 3 hours for inflammation and pain and immediately after any activity that aggravates your symptoms. Use ice packs or an ice massage.  · Heat treatment may be used prior to performing the stretching and  strengthening activities prescribed by your caregiver, physical therapist, or athletic trainer. Use a heat pack or a warm soak.  SEEK MEDICAL CARE IF:   · Symptoms get worse or do not improve in 2 weeks despite treatment.  · New, unexplained symptoms develop (drugs used in treatment may produce side effects).  EXERCISES  RANGE OF MOTION (ROM) AND STRETCHING EXERCISES - Cervical Strain and Sprain  These exercises may help you when beginning to rehabilitate your injury. In order to successfully resolve your symptoms, you must improve your posture. These exercises are designed to help reduce the forward-head and rounded-shoulder posture which contributes to this condition. Your symptoms may resolve with or without further involvement from your physician, physical therapist or athletic trainer. While completing these exercises, remember:   · Restoring tissue flexibility helps normal motion to return to the joints. This allows healthier, less painful movement and activity.  · An effective stretch should be held for at least 20 seconds, although you may need to begin with shorter hold times for comfort.  · A stretch should never be painful. You should only feel a gentle lengthening or release in the stretched tissue.  STRETCH- Axial Extensors  · Lie on your back on the floor. You may bend your knees for comfort. Place a rolled-up hand towel or dish towel, about 2 inches in diameter, under the part of your head that makes contact with the floor.  · Gently tuck your chin, as if trying to make a "double chin," until you feel a gentle stretch at the base of your head.  · Hold __________ seconds.  Repeat __________ times. Complete this exercise __________ times per day.   STRETCH - Axial Extension   · Stand or sit on a firm surface. Assume a good posture: chest up, shoulders drawn back, abdominal muscles slightly tense, knees unlocked (if standing) and feet hip width apart.  · Slowly retract your chin so your head slides back  and your chin slightly lowers. Continue to look straight ahead.  · You should feel a gentle stretch in the back of your head. Be certain not to feel an aggressive stretch since this can cause headaches later.  · Hold for __________ seconds.  Repeat __________ times. Complete this exercise __________ times per day.  STRETCH - Cervical Side Bend   · Stand or sit on a firm surface. Assume a good posture: chest up, shoulders drawn back, abdominal muscles slightly tense, knees unlocked (if standing) and feet hip width apart.  · Without letting your nose or shoulders move, slowly tip your right / left ear to your shoulder until your feel a gentle stretch in the muscles on the opposite side of your neck.  · Hold __________ seconds.  Repeat __________ times. Complete this exercise __________ times per day.  STRETCH - Cervical Rotators   · Stand or sit on a firm surface. Assume a good posture: chest up, shoulders drawn back, abdominal muscles slightly tense, knees unlocked (if standing) and feet hip width apart.  · Keeping your eyes level with the ground, slowly turn your head until you feel a gentle stretch along   the back and opposite side of your neck.  · Hold __________ seconds.  Repeat __________ times. Complete this exercise __________ times per day.  RANGE OF MOTION - Neck Circles   · Stand or sit on a firm surface. Assume a good posture: chest up, shoulders drawn back, abdominal muscles slightly tense, knees unlocked (if standing) and feet hip width apart.  · Gently roll your head down and around from the back of one shoulder to the back of the other. The motion should never be forced or painful.  · Repeat the motion 10-20 times, or until you feel the neck muscles relax and loosen.  Repeat __________ times. Complete the exercise __________ times per day.  STRENGTHENING EXERCISES - Cervical Strain and Sprain  These exercises may help you when beginning to rehabilitate your injury. They may resolve your symptoms with or  without further involvement from your physician, physical therapist, or athletic trainer. While completing these exercises, remember:   · Muscles can gain both the endurance and the strength needed for everyday activities through controlled exercises.  · Complete these exercises as instructed by your physician, physical therapist, or athletic trainer. Progress the resistance and repetitions only as guided.  · You may experience muscle soreness or fatigue, but the pain or discomfort you are trying to eliminate should never worsen during these exercises. If this pain does worsen, stop and make certain you are following the directions exactly. If the pain is still present after adjustments, discontinue the exercise until you can discuss the trouble with your clinician.  STRENGTH - Cervical Flexors, Isometric  · Face a wall, standing about 6 inches away. Place a small pillow, a ball about 6-8 inches in diameter, or a folded towel between your forehead and the wall.  · Slightly tuck your chin and gently push your forehead into the soft object. Push only with mild to moderate intensity, building up tension gradually. Keep your jaw and forehead relaxed.  · Hold 10 to 20 seconds. Keep your breathing relaxed.  · Release the tension slowly. Relax your neck muscles completely before you start the next repetition.  Repeat __________ times. Complete this exercise __________ times per day.  STRENGTH- Cervical Lateral Flexors, Isometric   · Stand about 6 inches away from a wall. Place a small pillow, a ball about 6-8 inches in diameter, or a folded towel between the side of your head and the wall.  · Slightly tuck your chin and gently tilt your head into the soft object. Push only with mild to moderate intensity, building up tension gradually. Keep your jaw and forehead relaxed.  · Hold 10 to 20 seconds. Keep your breathing relaxed.  · Release the tension slowly. Relax your neck muscles completely before you start the next  repetition.  Repeat __________ times. Complete this exercise __________ times per day.  STRENGTH - Cervical Extensors, Isometric   · Stand about 6 inches away from a wall. Place a small pillow, a ball about 6-8 inches in diameter, or a folded towel between the back of your head and the wall.  · Slightly tuck your chin and gently tilt your head back into the soft object. Push only with mild to moderate intensity, building up tension gradually. Keep your jaw and forehead relaxed.  · Hold 10 to 20 seconds. Keep your breathing relaxed.  · Release the tension slowly. Relax your neck muscles completely before you start the next repetition.  Repeat __________ times. Complete this exercise __________ times per day.    POSTURE AND BODY MECHANICS CONSIDERATIONS - Cervical Strain and Sprain  Keeping correct posture when sitting, standing or completing your activities will reduce the stress put on different body tissues, allowing injured tissues a chance to heal and limiting painful experiences. The following are general guidelines for improved posture. Your physician or physical therapist will provide you with any instructions specific to your needs. While reading these guidelines, remember:  · The exercises prescribed by your provider will help you have the flexibility and strength to maintain correct postures.  · The correct posture provides the optimal environment for your joints to work. All of your joints have less wear and tear when properly supported by a spine with good posture. This means you will experience a healthier, less painful body.  · Correct posture must be practiced with all of your activities, especially prolonged sitting and standing. Correct posture is as important when doing repetitive low-stress activities (typing) as it is when doing a single heavy-load activity (lifting).  PROLONGED STANDING WHILE SLIGHTLY LEANING FORWARD  When completing a task that requires you to lean forward while standing in one  place for a long time, place either foot up on a stationary 2- to 4-inch high object to help maintain the best posture. When both feet are on the ground, the low back tends to lose its slight inward curve. If this curve flattens (or becomes too large), then the back and your other joints will experience too much stress, fatigue more quickly, and can cause pain.   RESTING POSITIONS  Consider which positions are most painful for you when choosing a resting position. If you have pain with flexion-based activities (sitting, bending, stooping, squatting), choose a position that allows you to rest in a less flexed posture. You would want to avoid curling into a fetal position on your side. If your pain worsens with extension-based activities (prolonged standing, working overhead), avoid resting in an extended position such as sleeping on your stomach. Most people will find more comfort when they rest with their spine in a more neutral position, neither too rounded nor too arched. Lying on a non-sagging bed on your side with a pillow between your knees, or on your back with a pillow under your knees will often provide some relief. Keep in mind, being in any one position for a prolonged period of time, no matter how correct your posture, can still lead to stiffness.  WALKING  Walk with an upright posture. Your ears, shoulders, and hips should all line up.  OFFICE WORK  When working at a desk, create an environment that supports good, upright posture. Without extra support, muscles fatigue and lead to excessive strain on joints and other tissues.  CHAIR:  · A chair should be able to slide under your desk when your back makes contact with the back of the chair. This allows you to work closely.  · The chair's height should allow your eyes to be level with the upper part of your monitor and your hands to be slightly lower than your elbows.  · Body position:    Your feet should make contact with the floor. If this is not  possible, use a foot rest.    Keep your ears over your shoulders. This will reduce stress on your neck and low back.     This information is not intended to replace advice given to you by your health care provider. Make sure you discuss any questions you have with your health care provider.       Document Released: 05/31/2005 Document Revised: 06/21/2014 Document Reviewed: 09/12/2008  [employer]vier Interactive Patient Education ©2016 [employer]

## 2015-10-17 NOTE — ED Provider Notes (Signed)
CSN: [id number]     Arrival date & time 10/17/15  0911 History   First MD Initiated Contact with Patient 10/17/15 [phone]     Chief Complaint  Patient presents with  . Neck Pain     (Consider location/radiation/quality/duration/timing/severity/associated sxs/prior Treatment) Patient is a 50 y.o. male presenting with neck pain. The history is provided by the patient.  Neck Pain Pain location:  R side Quality:  Shooting Pain radiates to:  R shoulder Pain severity:  Moderate Pain is:  Same all the time Onset quality:  Sudden Duration:  1 day Timing:  Constant Progression:  Unchanged Chronicity:  New Context comment:  Twisting neck to the right Relieved by: oxycodone. Worsened by:  Nothing tried Ineffective treatments:  None tried Associated symptoms: no numbness, no paresis and no weakness   Risk factors comment:  "ruptured disc repair" of neck remotely   Past Medical History  Diagnosis Date  . Penile lesion   . Arthritis    Past Surgical History  Procedure Laterality Date  . Anterior cervical decomp/discectomy fusion  12-31-2002    C5 -- C7  . Posterior fusion cervical spine  02-26-2005    C5 -- C7  . Penile biopsy N/A 08/23/2012    Procedure: EXCISIONAL BIOPSY OF PENILE LESION;  Surgeon: [name], MD;  [facility];  Service: Urology;  Laterality: N/A;  EXCISIONAL BIOPSY OF PENILE LESION     Family History  Problem Relation Age of Onset  . Hypertension Mother   . Diabetes Mother   . Hypertension Father   . Cancer Father    Social History  Substance Use Topics  . Smoking status: Former Smoker -- 3.00 packs/day for 21 years    Types: Cigarettes    Quit date: 08/18/2002  . Smokeless tobacco: Former NeurosurgeonUser  . Alcohol Use: Yes     Comment: RARE    Review of Systems  Musculoskeletal: Positive for neck pain.  Neurological: Negative for weakness and numbness.  All other systems reviewed and are negative.     Allergies    Penicillins and Sulfa antibiotics  Home Medications   Prior to Admission medications   Medication Sig Start Date End Date Taking? Authorizing Provider  HYDROcodone-acetaminophen (NORCO/VICODIN) 5-325 MG tablet Take 1-2 tablets by mouth every 6 (six) hours as needed for severe pain. 10/01/15   Danelle BerryLeisa Tapia, PA-C  ibuprofen (ADVIL,MOTRIN) 800 MG tablet Take 1 tablet (800 mg total) by mouth 3 (three) times daily. 10/01/15   Danelle BerryLeisa Tapia, PA-C  methocarbamol (ROBAXIN) 500 MG tablet Take 1 tablet (500 mg total) by mouth 2 (two) times daily. 09/06/15   Mady GemmaElizabeth C Westfall, PA-C  naproxen (NAPROSYN) 500 MG tablet Take 1 tablet (500 mg total) by mouth 2 (two) times daily. Patient not taking: Reported on 10/01/2015 09/13/15   Rolland PorterMark James, MD  oxyCODONE-acetaminophen (PERCOCET) 10-325 MG tablet Take 1 tablet by mouth once.    Historical Provider, MD  oxyCODONE-acetaminophen (PERCOCET/ROXICET) 5-325 MG tablet Take 2 tablets by mouth every 4 (four) hours as needed. Patient not taking: Reported on 10/01/2015 09/13/15   Rolland PorterMark James, MD  traMADol (ULTRAM) 50 MG tablet Take 1 tablet (50 mg total) by mouth every 6 (six) hours as needed. Patient not taking: Reported on 10/01/2015 09/06/15   Mady GemmaElizabeth C Westfall, PA-C   BP 143/94 mmHg  Pulse 88  Temp(Src) 97.4 F (36.3 C) (Oral)  Resp 18  SpO2 95% Physical Exam  Constitutional: He is oriented to person, place, and time.  He appears well-developed and well-nourished. No distress.  HENT:  Head: Normocephalic and atraumatic.  Eyes: Conjunctivae are normal.  Neck: Neck supple. No tracheal deviation present.  Cardiovascular: Normal rate and regular rhythm.   Pulmonary/Chest: Effort normal. No respiratory distress.  Abdominal: Soft. He exhibits no distension.  Musculoskeletal:       Cervical back: He exhibits tenderness (on right side). He exhibits no bony tenderness.       Back:  Neurological: He is alert and oriented to person, place, and time. He has normal  strength. No sensory deficit. Coordination and gait normal.  Skin: Skin is warm and dry.  Psychiatric: He has a normal mood and affect.    ED Course  Procedures (including critical care time) Labs Review Labs Reviewed - No data to display  Imaging Review No results found. I have personally reviewed and evaluated these images and lab results as part of my medical decision-making.   EKG Interpretation None      MDM   Final diagnoses:  Neck muscle strain, initial encounter    50 y.o. male presents with Right-sided neck pain started after twisting yesterday at work. He has focal tenderness over the paraspinal musculature to the right. No symptoms of radiculopathy. No red flag symptoms for concerning neck pain. Offered a trigger point injection and declined, recommended early mobility and NSAIDs scheduled for definitive therapy.    Lyndal Pulley, MD 10/17/15 [phone]

## 2015-10-17 NOTE — ED Notes (Signed)
Per pt, states neck pain radiating down right shoulder-states he moved it wrong last night

## 2015-10-25 ENCOUNTER — Emergency Department (HOSPITAL_COMMUNITY)
Admission: EM | Admit: 2015-10-25 | Discharge: 2015-10-25 | Disposition: A | Discharge status: Home / Self Care | Payer: 59 | Attending: Emergency Medicine | Admitting: Emergency Medicine

## 2015-10-25 ENCOUNTER — Encounter (HOSPITAL_COMMUNITY): Payer: Self-pay | Admitting: Emergency Medicine

## 2015-10-25 DIAGNOSIS — Z79891 Long term (current) use of opiate analgesic: Secondary | ICD-10-CM | POA: Insufficient documentation

## 2015-10-25 DIAGNOSIS — Z79899 Other long term (current) drug therapy: Secondary | ICD-10-CM | POA: Insufficient documentation

## 2015-10-25 DIAGNOSIS — M545 Low back pain, unspecified: Secondary | ICD-10-CM

## 2015-10-25 DIAGNOSIS — Z791 Long term (current) use of non-steroidal anti-inflammatories (NSAID): Secondary | ICD-10-CM | POA: Diagnosis not present

## 2015-10-25 DIAGNOSIS — M1990 Unspecified osteoarthritis, unspecified site: Secondary | ICD-10-CM | POA: Diagnosis not present

## 2015-10-25 MED ORDER — METHOCARBAMOL 500 MG PO TABS: 500.0000 mg | ORAL_TABLET | Freq: Two times a day (BID) | ORAL | Status: DC

## 2015-10-25 MED ORDER — IBUPROFEN 600 MG PO TABS: 600.0000 mg | ORAL_TABLET | Freq: Four times a day (QID) | ORAL | Status: DC | PRN

## 2015-10-25 MED ORDER — HYDROCODONE-ACETAMINOPHEN 5-325 MG PO TABS: 1.0000 | ORAL_TABLET | ORAL | Status: DC | PRN

## 2015-10-25 NOTE — Discharge Instructions (Signed)
Medications as prescribed as needed for back pain. I recommend using prior taking ibuprofen to prevent gastrointestinal side effects. I recommend applying ice to lower back for 15-20 minutes 3-4 times daily for the next 24 hours, he may then begin applying heat as needed for pain relief. Refrain from doing any heavy lifting, squatting or repetitive movements that exacerbate her symptoms for the next few days. Please follow up with a primary care provider from the Resource Guide provided below in 1 week as needed. Please return to the Emergency Department if symptoms worsen or new onset of fever, numbness, tingling, groin numbness, abdominal pain, urinary retention, loss of bowel or bladder, weakness.

## 2015-10-25 NOTE — ED Provider Notes (Signed)
CSN: [id number]     Arrival date & time 10/25/15  4098 History   First MD Initiated Contact with Patient 10/25/15 1101     Chief Complaint  Patient presents with  . Back Pain     (Consider location/radiation/quality/duration/timing/severity/associated sxs/prior Treatment) HPI   Patient is a 50 year old male past medical history of arthritis who presents the ED with complaint of left lower back pain, onset 7 PM last night. Patient reports he was at home when a family member passed out resulting in him leaning forward and bending out to catch the family member. Pt denies falling but notes he strained his back during the event. He reports straining his lower back and endorses having constant sharp tightness to his left lower back. Denies radiation. He notes the pain is worse with standing for long periods of time, ambulating or bending. He notes he took his wife's Vicodin around 2 AM this morning with relief of pain however he states the pain has started to return since arrival to the ED. Pt denies fever, numbness, tingling, saddle anesthesia, abdominal pain, N/V, urinary retention, loss of bowel or bladder, weakness, IVDU, cancer or recent spinal manipulation.   Past Medical History  Diagnosis Date  . Penile lesion   . Arthritis    Past Surgical History  Procedure Laterality Date  . Anterior cervical decomp/discectomy fusion  12-31-2002    C5 -- C7  . Posterior fusion cervical spine  02-26-2005    C5 -- C7  . Penile biopsy N/A 08/23/2012    Procedure: EXCISIONAL BIOPSY OF PENILE LESION;  Surgeon: Milford Cage, MD;  Location: [facility];  Service: Urology;  Laterality: N/A;  EXCISIONAL BIOPSY OF PENILE LESION     Family History  Problem Relation Age of Onset  . Hypertension Mother   . Diabetes Mother   . Hypertension Father   . Cancer Father    Social History  Substance Use Topics  . Smoking status: Former Smoker -- 3.00 packs/day for 21 years    Types:  Cigarettes    Quit date: 08/18/2002  . Smokeless tobacco: Former Neurosurgeon  . Alcohol Use: Yes     Comment: RARE    Review of Systems  Musculoskeletal: Positive for back pain.  All other systems reviewed and are negative.     Allergies  Penicillins and Sulfa antibiotics  Home Medications   Prior to Admission medications   Medication Sig Start Date End Date Taking? Authorizing Provider  HYDROcodone-acetaminophen (NORCO/VICODIN) 5-325 MG tablet Take 1 tablet by mouth every 4 (four) hours as needed. 10/25/15   Barrett Henle, PA-C  ibuprofen (ADVIL,MOTRIN) 600 MG tablet Take 1 tablet (600 mg total) by mouth every 6 (six) hours as needed. 10/25/15   Barrett Henle, PA-C  methocarbamol (ROBAXIN) 500 MG tablet Take 1 tablet (500 mg total) by mouth 2 (two) times daily. 10/25/15   Barrett Henle, PA-C  naproxen (NAPROSYN) 500 MG tablet Take 1 tablet (500 mg total) by mouth 2 (two) times daily. Patient not taking: Reported on 10/01/2015 09/13/15   Rolland Porter, MD  oxyCODONE-acetaminophen (PERCOCET) 10-325 MG tablet Take 1 tablet by mouth once.    Historical Provider, MD  oxyCODONE-acetaminophen (PERCOCET/ROXICET) 5-325 MG tablet Take 2 tablets by mouth every 4 (four) hours as needed. Patient not taking: Reported on 10/01/2015 09/13/15   Rolland Porter, MD  traMADol (ULTRAM) 50 MG tablet Take 1 tablet (50 mg total) by mouth every 6 (six) hours as needed. Patient not  taking: Reported on 10/01/2015 09/06/15   Mady GemmaElizabeth C Westfall, PA-C   BP 147/94 mmHg  Pulse 99  Temp(Src) 97.9 F (36.6 C) (Oral)  Resp 16  SpO2 95% Physical Exam  Constitutional: He is oriented to person, place, and time. He appears well-developed and well-nourished.  HENT:  Head: Normocephalic and atraumatic.  Eyes: Conjunctivae and EOM are normal. Right eye exhibits no discharge. Left eye exhibits no discharge. No scleral icterus.  Neck: Normal range of motion. Neck supple.  Cardiovascular: Normal rate, regular  rhythm, normal heart sounds and intact distal pulses.   Pulmonary/Chest: Effort normal and breath sounds normal. No respiratory distress. He has no wheezes. He has no rales. He exhibits no tenderness.  Abdominal: Soft. Bowel sounds are normal. He exhibits no distension and no mass. There is no tenderness. There is no rebound and no guarding.  Musculoskeletal: He exhibits no edema.  No midline C, T, or L tenderness. TTP over left lower thoracic and lumbar paraspinal muscles. Full range of motion of neck and dec ROM of back due to reported pain. Full range of motion of bilateral upper and lower extremities, with 5/5 strength. Sensation intact. 2+ radial and PT pulses. Cap refill <2 seconds. Patient able to stand and ambulate without assistance but endorses pain.    Neurological: He is alert and oriented to person, place, and time. He has normal reflexes.  Skin: Skin is warm and dry.  Nursing note and vitals reviewed.   ED Course  Procedures (including critical care time) Labs Review Labs Reviewed - No data to display  Imaging Review No results found. I have personally reviewed and evaluated these images and lab results as part of my medical decision-making.   EKG Interpretation None      MDM   Final diagnoses:  Left-sided low back pain without sciatica    Patient with back pain.  No neurological deficits and normal neuro exam.  Patient can walk but states is painful.  No loss of bowel or bladder control.  No concern for cauda equina.  No fever, night sweats, weight loss, h/o cancer, IVDU. Patient sore over left lower paraspinal muscles. I suspect patient's pain is likely due to muscle strain associated with recent injury and do not feel any further workup or imaging is warranted at this time. Plan to discharge patient home with symptomatic treatment including RICE protocol and pain medicine.  Evaluation does not show pathology requring ongoing emergent intervention or admission. Pt is  hemodynamically stable and mentating appropriately. Discussed findings/results and plan with patient/guardian, who agrees with plan. All questions answered. Return precautions discussed and outpatient follow up given.      Satira Sarkicole Elizabeth ShrewsburyNadeau, [place] JerseyPA-C 10/25/15 1135  Mancel BaleElliott Wentz, MD 10/25/15 1756

## 2015-10-25 NOTE — ED Notes (Signed)
Pt caught family member who was falling last night which caused him to strain his lower back. Took wife's vicoden prior to arrival which has helped pain.

## 2015-10-25 NOTE — ED Notes (Signed)
He c/o low back pain as stated in triage note.  He denies any paresthesias of bilat. Lower extremities and is in no distress.

## 2015-12-13 ENCOUNTER — Encounter (HOSPITAL_COMMUNITY): Payer: Self-pay

## 2015-12-13 ENCOUNTER — Emergency Department (HOSPITAL_COMMUNITY)
Admission: EM | Admit: 2015-12-13 | Discharge: 2015-12-13 | Disposition: A | Discharge status: Home / Self Care | Payer: 59 | Attending: Emergency Medicine | Admitting: Emergency Medicine

## 2015-12-13 DIAGNOSIS — Z791 Long term (current) use of non-steroidal anti-inflammatories (NSAID): Secondary | ICD-10-CM | POA: Diagnosis not present

## 2015-12-13 DIAGNOSIS — Z87891 Personal history of nicotine dependence: Secondary | ICD-10-CM | POA: Insufficient documentation

## 2015-12-13 DIAGNOSIS — Z79899 Other long term (current) drug therapy: Secondary | ICD-10-CM | POA: Insufficient documentation

## 2015-12-13 DIAGNOSIS — M1990 Unspecified osteoarthritis, unspecified site: Secondary | ICD-10-CM | POA: Insufficient documentation

## 2015-12-13 DIAGNOSIS — M5416 Radiculopathy, lumbar region: Secondary | ICD-10-CM | POA: Insufficient documentation

## 2015-12-13 DIAGNOSIS — M5441 Lumbago with sciatica, right side: Secondary | ICD-10-CM | POA: Diagnosis present

## 2015-12-13 MED ORDER — ACETAMINOPHEN 500 MG PO TABS
1000.0000 mg | ORAL_TABLET | Freq: Once | ORAL | Status: AC
  Administered 2015-12-13: 1000 mg via ORAL
  Filled 2015-12-13: qty 2

## 2015-12-13 MED ORDER — IBUPROFEN 800 MG PO TABS
800.0000 mg | ORAL_TABLET | Freq: Once | ORAL | Status: AC
  Administered 2015-12-13: 800 mg via ORAL
  Filled 2015-12-13: qty 1

## 2015-12-13 NOTE — ED Provider Notes (Signed)
CSN: [id number]     Arrival date & time 12/13/15  1039 History   First MD Initiated Contact with Patient 12/13/15 1100     Chief Complaint  Patient presents with  . Sciatica     (Consider location/radiation/quality/duration/timing/severity/associated sxs/prior Treatment) HPI   Blood pressure 138/114, pulse 100, temperature 98.2 F (36.8 C), temperature source Oral, resp. rate 17, SpO2 95 %.  Mike Watkins is a 50 y.o. male complaining of right-sided lower back pain and "sciatica" x2 days.  He first noted acute onset pain when getting out of his truck when he "bent the wrong way" and experienced lumbar pain shooting down right hip and leg.  He now rates the pain 8/10 at its worst.  He currently endorses mild numbness back of thigh and fleeting sharp pain with certain bending movements and prolonged sitting.  The patient has taken vicodin, unknown muscle relaxer, and ibuprofen with some relief. He denies bowel or urinary incontinence, nausea, diarrhea, fever, weakness, paresthesias, loss of coordination or balance. He also denies precipitating symptoms to initial onset, prior episodes of these symptoms, or recent illness or injury. He does have a history of cervical bulging disk surgery 2005 and chronic right knee meniscal "problems."  Past Medical History  Diagnosis Date  . Penile lesion   . Arthritis    Past Surgical History  Procedure Laterality Date  . Anterior cervical decomp/discectomy fusion  12-31-2002    C5 -- C7  . Posterior fusion cervical spine  02-26-2005    C5 -- C7  . Penile biopsy N/A 08/23/2012    Procedure: EXCISIONAL BIOPSY OF PENILE LESION;  Surgeon: [name], MD;  [facility];  Service: Urology;  Laterality: N/A;  EXCISIONAL BIOPSY OF PENILE LESION     Family History  Problem Relation Age of Onset  . Hypertension Mother   . Diabetes Mother   . Hypertension Father   . Cancer Father    Social History  Substance Use Topics   . Smoking status: Former Smoker -- 3.00 packs/day for 21 years    Types: Cigarettes    Quit date: 08/18/2002  . Smokeless tobacco: Former NeurosurgeonUser  . Alcohol Use: Yes     Comment: RARE    Review of Systems  10 systems reviewed and found to be negative, except as noted in the HPI.  Allergies  Penicillins and Sulfa antibiotics  Home Medications   Prior to Admission medications   Medication Sig Start Date End Date Taking? Authorizing Provider  HYDROcodone-acetaminophen (NORCO/VICODIN) 5-325 MG tablet Take 1 tablet by mouth every 4 (four) hours as needed. 10/25/15   Barrett HenleNicole Elizabeth Nadeau, PA-C  ibuprofen (ADVIL,MOTRIN) 600 MG tablet Take 1 tablet (600 mg total) by mouth every 6 (six) hours as needed. 10/25/15   Barrett HenleNicole Elizabeth Nadeau, PA-C  methocarbamol (ROBAXIN) 500 MG tablet Take 1 tablet (500 mg total) by mouth 2 (two) times daily. 10/25/15   Barrett HenleNicole Elizabeth Nadeau, PA-C  naproxen (NAPROSYN) 500 MG tablet Take 1 tablet (500 mg total) by mouth 2 (two) times daily. Patient not taking: Reported on 10/01/2015 09/13/15   Rolland PorterMark James, MD  oxyCODONE-acetaminophen (PERCOCET) 10-325 MG tablet Take 1 tablet by mouth once.    Historical Provider, MD  oxyCODONE-acetaminophen (PERCOCET/ROXICET) 5-325 MG tablet Take 2 tablets by mouth every 4 (four) hours as needed. Patient not taking: Reported on 10/01/2015 09/13/15   Rolland PorterMark James, MD  traMADol (ULTRAM) 50 MG tablet Take 1 tablet (50 mg total) by mouth every 6 (  six) hours as needed. Patient not taking: Reported on 10/01/2015 09/06/15   Mady GemmaElizabeth C Westfall, PA-C   BP 138/114 mmHg  Pulse 100  Temp(Src) 98.2 F (36.8 C) (Oral)  Resp 17  SpO2 95% Physical Exam  Constitutional: He appears well-developed and well-nourished.  HENT:  Head: Normocephalic.  Eyes: Conjunctivae are normal.  Neck: Normal range of motion.  Cardiovascular: Normal rate, regular rhythm and intact distal pulses.   Pulmonary/Chest: Effort normal.  Abdominal: Soft. There is no  tenderness.  Neurological: He is alert.  No point tenderness to percussion of lumbar spinal processes.  No TTP or paraspinal muscular spasm. Strength is 5 out of 5 to bilateral lower extremities at hip and knee; extensor hallucis longus 5 out of 5. Ankle strength 5 out of 5, no clonus, neurovascularly intact. No saddle anaesthesia. Patellar reflexes are 2+ bilaterally.    Ambulates with a coordinated in nonantalgic gait. Straight leg raise is positive on the ipsilateral (right side at approximately 25, positive on the contralateral side at 45.   Psychiatric: He has a normal mood and affect.  Nursing note and vitals reviewed.   ED Course  Procedures (including critical care time) Labs Review Labs Reviewed - No data to display  Imaging Review No results found. I have personally reviewed and evaluated these images and lab results as part of my medical decision-making.   EKG Interpretation None      MDM   Final diagnoses:  Lumbar radiculopathy   Filed Vitals:   12/13/15 1043 12/13/15 1146  BP: 138/114   Pulse: 100   Temp:  98.2 F (36.8 C)  TempSrc:  Oral  Resp: 17   SpO2: 95%     Medications  ibuprofen (ADVIL,MOTRIN) tablet 800 mg (800 mg Oral Given 12/13/15 1147)  acetaminophen (TYLENOL) tablet 1,000 mg (1,000 mg Oral Given 12/13/15 1147)    Mike Watkins is 50 y.o. male presenting with  back pain.  No neurological deficits and normal neuro exam.  Patient can walk but states is painful.  No loss of bowel or bladder control.  No concern for cauda equina.  No fever, night sweats, weight loss, h/o cancer, IVDU.  RICE protocol and pain medicine indicated and discussed with patient.  Evaluation does not show pathology that would require ongoing emergent intervention or inpatient treatment. Pt is hemodynamically stable and mentating appropriately. Discussed findings and plan with patient/guardian, who agrees with care plan. All questions answered. Return precautions discussed  and outpatient follow up given.       Wynetta Emeryicole Zawadi Aplin, PA-C 12/13/15 1440  Richardean Canalavid H Yao, MD 12/13/15 [phone]

## 2015-12-13 NOTE — ED Notes (Signed)
Pt c/o R lower back pain radiating into R hip and R left x "a couple days."  Pain score 7/10.  Denies injury.  Sts he was getting out of his truck when pain began.  Pt reports taking his wife's Vicodin w/ relief.

## 2015-12-13 NOTE — Discharge Instructions (Signed)
For pain control please take ibuprofen (also known as Motrin or Advil) 800mg  (this is normally 4 over the counter pills) 3 times a day  for 5 days. Take with food to minimize stomach irritation.  Do not hesitate to return to the emergency room for any new, worsening or concerning symptoms.  Please obtain primary care using resource guide below. Let them know that you were seen in the emergency room and that they will need to obtain records for further outpatient management.    Lumbosacral Radiculopathy Lumbosacral radiculopathy is a condition that involves the spinal nerves and nerve roots in the low back and bottom of the spine. The condition develops when these nerves and nerve roots move out of place or become inflamed and cause symptoms. CAUSES This condition may be caused by:  Pressure from a disk that bulges out of place (herniated disk). A disk is a plate of cartilage that separates bones in the spine.  Disk degeneration.  A narrowing of the bones of the lower back (spinal stenosis).  A tumor.  An infection.  An injury that places sudden pressure on the disks that cushion the bones of your lower spine. RISK FACTORS This condition is more likely to develop in:  Males aged 30-50 years.  Females aged 50-60 years.  People who lift improperly.  People who are overweight or live a sedentary lifestyle.  People who smoke.  People who perform repetitive activities that strain the spine. SYMPTOMS Symptoms of this condition include:  Pain that goes down from the back into the legs (sciatica). This is the most common symptom. The pain may be worse with sitting, coughing, or sneezing.  Pain and numbness in the arms and legs.  Muscle weakness.  Tingling.  Loss of bladder control or bowel control. DIAGNOSIS This condition is diagnosed with a physical exam and medical history. If the pain is lasting, you may have tests, such as:  MRI scan.  X-ray.  CT  scan.  Myelogram.  Nerve conduction study. TREATMENT This condition is often treated with:  Hot packs and ice applied to affected areas.  Stretches to improve flexibility.  Exercises to strengthen back muscles.  Physical therapy.  Pain medicine.  A steroid injection in the spine. In some cases, no treatment is needed. If the condition is long-lasting (chronic), or if symptoms are severe, treatment may involve surgery or lifestyle changes, such as following a weight loss plan. HOME CARE INSTRUCTIONS Medicines  Take medicines only as directed by your health care provider.  Do not drive or operate heavy machinery while taking pain medicine. Injury Care  Apply a heat pack to the injured area as directed by your health care provider.  Apply ice to the affected area:  Put ice in a plastic bag.  Place a towel between your skin and the bag.  Leave the ice on for 20-30 minutes, every 2 hours while you are awake or as needed. Or, leave the ice on for as long as directed by your health care provider. Other Instructions  If you were shown how to do any exercises or stretches, do them as directed by your health care provider.  If your health care provider prescribed a diet or exercise program, follow it as directed.  Keep all follow-up visits as directed by your health care provider. This is important. SEEK MEDICAL CARE IF:  Your pain does not improve over time even when taking pain medicines. SEEK IMMEDIATE MEDICAL CARE IF:  Your develop severe pain.  Your pain suddenly gets worse.  You develop increasing weakness in your legs.  You lose the ability to control your bladder or bowel.  You have difficulty walking or balancing.  You have a fever.   This information is not intended to replace advice given to you by your health care provider. Make sure you discuss any questions you have with your health care provider.   Document Released: 05/31/2005 Document Revised:  10/15/2014 Document Reviewed: 05/27/2014 Elsevier Interactive Patient Education 2016 [employer]. [employer] Assistance The United Ways 211 is a great source of information about community services available.  Access by dialing 2-1-1 from anywhere in [place], or by website -  PooledIncome.pl.   Other Local Resources (Updated 06/2015)  Financial Assistance   Services    Phone Number and Address  [facility]  Low-cost medical care - 1st and 3rd Saturday of every month  Must not qualify for public or private insurance and must have limited income [phone] [address] [place], [place]    [place] [employer] of Social Services  Child care  Emergency assistance for housing and [employer]  Medicaid [phone] [address] [place], [place] [address]   [facility] Department  Low-cost medical care for children, communicable diseases, sexually-transmitted diseases, immunizations, maternity care, womens health and family planning [phone] [address] [place], [place] [address]  [facility]   Medication assistance for [facility] residents  Must meet income requirements [phone] [address] [place], [place].    [facility] Social Services  Child care  Emergency assistance for housing and [employer]  Medicaid [phone] [address] [place], [place] [address]  [facility]   Low-cost medical care,   Monday through Friday, 9 am to 6 pm.   Accepts Medicare/Medicaid, and self-pay [phone] [address] [place], [place] [address]  [facility]  Low-cost medical care - Monday through Friday, 8:30 am - 5:30 pm  Accepts Medicaid and self-pay [phone] [address] [place], [place] [address]   [facility]  Primary medical care, including for those with sickle cell disease  Accepts Medicare, Medicaid, insurance and self-pay [phone] [address] [place], [place]  [facility]   Primary medical care  Accepts Medicare, [place], insurance and self-pay [phone] [address] [place], [place] [address]   [facility] Department of Social Services  Child care  Emergency assistance for housing and [employer]  Medicaid [phone] [address] [place], [place] [address]  [facility] Department of Health and [employer]  Child care  Emergency assistance for housing and [employer]  Medicaid [phone] [address] [place], [place] [address]   [facility] Medication Assistance Program  Medication assistance for [facility] residents with no insurance only  Must have a primary care doctor [phone] [address] [place], [place]  [facility]   Primary medical care  [place], [place], insurance  [phone] [address] [place], [place]  [facility]   Medication assistance [phone]  [facility]   Primary medical care  Accepts Medicare, [place], insurance and self-pay [phone] [address] [place], [place] [address]  [facility]   Primary medical care  Accepts Medicare, [place], insurance and self-pay [phone] [address] [place], [place] [address]  [facility]  For [place] residents between the ages of 2618 and 6764 who do not have any form of health insurance, Medicare, [place]icaid, or [place] benefits.  Services are provided free of charge to uninsured patients who fall within federal poverty guidelines.    Hours: Tuesdays and Thursdays, 4:15 - 8 pm [phone] [address] [place], [place] [address]  [facility]      Primary medical care  Dental care  Nutritional counseling  Pharmacy  Accepts Medicaid, Medicare, most insurance.  Fees are adjusted based on ability to pay.   [phone] [facility] [address] [place], [place]  [phone] [facility] [address] [place], [place]  [phone] [facility] [place], [place]  [phone] [facility], [address] [place], [place]  [phone] [facility] [address] [place], [place]  [facility]  Womens health and family planning [phone] [address] [place], [place]  [facility] of Social Services  Child care  Emergency assistance for housing and [employer]ark[employer]ilities  Food stamps  Medicaid [phone] [address], [place], [place] [address]   [facility]    Ages 5918 and older  Hours: Mondays and Thursdays, 7:00 am - 9:00 am Patients are seen on a first come, first served basis. [phone] [address] [place], [place]  [facility] of Social Services  Child care  Emergency assistance for housing and [employer]arkutilities  Food stamps  Medicaid [phone] [address] [place], [place] [address]  [facility] [employer]  Medication assistance  Rental assistance  Food pantry  Medication assistance  Housing assistance  Emergency food distribution  Utility assistance [phone] [address] [place], [place]  [phone]  [address] [place], [place] [address] Hours: Tuesdays and Thursdays from 9am - 12 noon by appointment only  [phone] [address] [place], [place] [address]  [facility] - Lanae Boastlara F. Gunn   Accepts private insurance, [place], and [place]icaid.  Payment is based on a sliding scale for those without insurance.  Hours: Mondays, Tuesdays and Thursdays, 8:30 am - 5:30 pm.    [phone] [address] [place], [place]  [facility] - Family [facility] at [facility]    Accepts private insurance, [place], and [place].  Payment is based on a sliding scale for those without insurance. [phone] [address] [place], [place]  [facility] - Pediatrics at [address]. Scientist, research (physical sciences)[place]  Accepts private insurance, [employer]Medicare, and [place]icaid.  Payment is based on a sliding scale for those without insurance [phone] [address], [place], [place]  [facility] - [facility]s at [place]  Accepts private insurance, [place] FireMedicare, and [place]icaid.  Payment is based on a sliding scale for those without insurance. [phone] [address] [place], [place]  [facility] - Pediatrics at [facility]  Accepts private insurance, [place], and [place]icaid.  Payment is based on a sliding scale for those without insurance. [phone], [phone] [place], [place].    [facility]  Maternity care.  Accepts Medicaid and self-pay. [phone] [address] [place], [place]

## 2016-03-20 ENCOUNTER — Emergency Department (HOSPITAL_COMMUNITY)
Admission: EM | Admit: 2016-03-20 | Discharge: 2016-03-20 | Disposition: A | Discharge status: Home / Self Care | Payer: 59 | Attending: Emergency Medicine | Admitting: Emergency Medicine

## 2016-03-20 ENCOUNTER — Encounter (HOSPITAL_COMMUNITY): Payer: Self-pay

## 2016-03-20 DIAGNOSIS — Z791 Long term (current) use of non-steroidal anti-inflammatories (NSAID): Secondary | ICD-10-CM | POA: Insufficient documentation

## 2016-03-20 DIAGNOSIS — Z87891 Personal history of nicotine dependence: Secondary | ICD-10-CM | POA: Diagnosis not present

## 2016-03-20 DIAGNOSIS — M5442 Lumbago with sciatica, left side: Secondary | ICD-10-CM | POA: Diagnosis not present

## 2016-03-20 DIAGNOSIS — M545 Low back pain: Secondary | ICD-10-CM | POA: Diagnosis present

## 2016-03-20 MED ORDER — TRAMADOL HCL 50 MG PO TABS: 50.0000 mg | ORAL_TABLET | Freq: Four times a day (QID) | ORAL | 0 refills | Status: DC | PRN

## 2016-03-20 NOTE — ED Triage Notes (Signed)
PT C/O LOWER BACK PAIN SINCE YESTERDAY WHILE HE WAS MOVING A PIANO YESTERDAY. PT STS WHEN HE TURNED, HE FELT A PULL IN THE LOWER BACK. PT DENIES NEUROLOGIC SYMPTOMS.

## 2016-03-20 NOTE — Discharge Instructions (Signed)
Please read and follow all provided instructions.  Your diagnoses today include:  1. Acute bilateral low back pain with left-sided sciatica    Tests performed today include:  Vital signs - see below for your results today  Medications prescribed:   Tramadol - narcotic-like pain medication  DO NOT drive or perform any activities that require you to be awake and alert because this medicine can make you drowsy.   Take any prescribed medications only as directed.  Home care instructions:   Follow any educational materials contained in this packet  Please rest, use ice or heat on your back for the next several days  Do not lift, push, pull anything more than 10 pounds for the next week  Follow-up instructions: Please follow-up with your primary care provider in the next 1 week for further evaluation of your symptoms.   Return instructions:  SEEK IMMEDIATE MEDICAL ATTENTION IF YOU HAVE:  New numbness, tingling, weakness, or problem with the use of your arms or legs  Severe back pain not relieved with medications  Loss control of your bowels or bladder  Increasing pain in any areas of the body (such as chest or abdominal pain)  Shortness of breath, dizziness, or fainting.   Worsening nausea (feeling sick to your stomach), vomiting, fever, or sweats  Any other emergent concerns regarding your health   Additional Information:  Your vital signs today were: BP 129/82 (BP Location: Right Arm)    Pulse 102    Temp 98.5 F (36.9 C) (Oral)    Ht 5\' 10"  (1.778 m)    Wt 128 kg    SpO2 96%    BMI 40.49 kg/m  If your blood pressure (BP) was elevated above 135/85 this visit, please have this repeated by your doctor within one month. --------------

## 2016-03-20 NOTE — ED Provider Notes (Signed)
[facility]-EMERGENCY DEPT Provider Note   CSN: [id number] Arrival date & time: 03/20/16  1628  By signing my name below, I, Mike Watkins, attest that this documentation has been prepared under the direction and in the presence of Josh Juna Caban PA-C.  Electronically Signed: Vista Minkobert Watkins, ED Scribe. 03/20/16. 5:51 PM.   History   Chief Complaint Chief Complaint  Patient presents with  . Back Pain    HPI HPI Comments: Mike Watkins is a 50 y.o. male with a Hx of back pain/sciatica, who presents to the Emergency Department complaining of sudden onset, constant lower back pain that started yesterday. Pt was moving a grand piano while helping his mother move and had sudden onset back pain when he turned and felt a pulling sensation in his lower back. He took a leftover Percocet last night that his wife had but reports no relief. No fever or bowel or bladder incontinence. Pain radiates into left buttock and left hip.   The history is provided by the patient. No language interpreter was used.    Past Medical History:  Diagnosis Date  . Arthritis   . Penile lesion     There are no active problems to display for this patient.   Past Surgical History:  Procedure Laterality Date  . ANTERIOR CERVICAL DECOMP/DISCECTOMY FUSION  12-31-2002   C5 -- C7  . PENILE BIOPSY N/A 08/23/2012   Procedure: EXCISIONAL BIOPSY OF PENILE LESION;  Surgeon: [name], MD;  [facility];  Service: Urology;  Laterality: N/A;  EXCISIONAL BIOPSY OF PENILE LESION    . POSTERIOR FUSION CERVICAL SPINE  02-26-2005   C5 -- C7     Home Medications    Prior to Admission medications   Medication Sig Start Date End Date Taking? Authorizing Provider  ibuprofen (ADVIL,MOTRIN) 600 MG tablet Take 1 tablet (600 mg total) by mouth every 6 (six) hours as needed. 10/25/15   Barrett HenleNicole Elizabeth Nadeau, PA-C  methocarbamol (ROBAXIN) 500 MG tablet Take 1 tablet (500 mg total) by mouth 2 (two) times  daily. 10/25/15   Barrett HenleNicole Elizabeth Nadeau, PA-C  traMADol (ULTRAM) 50 MG tablet Take 1 tablet (50 mg total) by mouth every 6 (six) hours as needed. 03/20/16   Renne CriglerJoshua Yisell Sprunger, PA-C    Family History Family History  Problem Relation Age of Onset  . Hypertension Mother   . Diabetes Mother   . Hypertension Father   . Cancer Father     Social History Social History  Substance Use Topics  . Smoking status: Former Smoker    Packs/day: 3.00    Years: 21.00    Types: Cigarettes    Quit date: 08/18/2002  . Smokeless tobacco: Former NeurosurgeonUser  . Alcohol use Yes     Comment: RARE     Allergies   Penicillins and Sulfa antibiotics   Review of Systems Review of Systems  Constitutional: Negative for fever and unexpected weight change.  Gastrointestinal: Negative for constipation.       Neg for fecal incontinence  Genitourinary: Negative for difficulty urinating, flank pain, hematuria and urgency.       Negative for urinary incontinence or retention  Musculoskeletal: Positive for back pain (lower).  Neurological: Negative for weakness and numbness.       Negative for saddle paresthesias      Physical Exam Updated Vital Signs BP 129/82 (BP Location: Right Arm)   Pulse 102   Temp 98.5 F (36.9 C) (Oral)   Ht 5\' 10"  (1.778  m)   Wt 282 lb 3.2 oz (128 kg)   SpO2 96%   BMI 40.49 kg/m   Physical Exam  Constitutional: He is oriented to person, place, and time. He appears well-developed and well-nourished. No distress.  HENT:  Head: Normocephalic and atraumatic.  Eyes: Conjunctivae are normal.  Neck: Normal range of motion.  Pulmonary/Chest: Effort normal.  Abdominal: Soft. There is no tenderness. There is no CVA tenderness.  Musculoskeletal: Normal range of motion.       Right shoulder: Normal.       Left shoulder: Normal.       Right hip: Normal.       Left hip: Normal.       Cervical back: Normal. He exhibits normal range of motion, no tenderness and no bony tenderness.        Thoracic back: Normal. He exhibits normal range of motion, no tenderness and no bony tenderness.       Lumbar back: He exhibits tenderness and bony tenderness. He exhibits normal range of motion.       Back:  No step-off noted with palpation of spine.   Neurological: He is alert and oriented to person, place, and time. He has normal reflexes. No sensory deficit. He exhibits normal muscle tone.  5/5 strength in entire lower extremities bilaterally. No sensation deficit.   Skin: Skin is warm and dry. He is not diaphoretic.  Psychiatric: He has a normal mood and affect. Judgment normal.  Nursing note and vitals reviewed.    ED Treatments / Results  DIAGNOSTIC STUDIES: Oxygen Saturation is 96% on RA, normal by my interpretation.  COORDINATION OF CARE: 5:33 PM-Will rx Tramadol. Pt has muscle relaxer at home. Pt instructed to rest his back and refrain from heavy lifting. Discussed treatment plan with pt at bedside and pt agreed to plan.   Procedures Procedures (including critical care time)  Medications Ordered in ED Medications - No data to display   Initial Impression / Assessment and Plan / ED Course  I have reviewed the triage vital signs and the nursing notes.  Pertinent labs & imaging results that were available during my care of the patient were reviewed by me and considered in my medical decision making (see chart for details).  Clinical Course   Vital signs reviewed and are as follows: Vitals:   03/20/16 1642  BP: 129/82  Pulse: 102  Temp: 98.5 F (36.9 C)    No red flag s/s of low back pain. Patient was counseled on back pain precautions and told to do activity as tolerated but do not lift, push, or pull heavy objects more than 10 pounds for the next week.  Patient counseled to use ice or heat on back for no longer than 15 minutes every hour.   Patient prescribed narcotic pain medicine and counseled on proper use of narcotic pain medications. Counseled not to combine  this medication with others containing tylenol.   Urged patient not to drink alcohol, drive, or perform any other activities that requires focus while taking this medication.   Patient urged to follow-up with PCP if pain does not improve with treatment and rest or if pain becomes recurrent. Urged to return with worsening severe pain, loss of bowel or bladder control, trouble walking.   The patient verbalizes understanding and agrees with the plan.   Final Clinical Impressions(s) / ED Diagnoses   Final diagnoses:  Acute bilateral low back pain with left-sided sciatica   Patient with back pain  after twisting injury. There is radicular component into left hip and buttocks. No neurological deficits. Patient is ambulatory. No warning symptoms of back pain including: fecal incontinence, urinary retention or overflow incontinence, night sweats, waking from sleep with back pain, unexplained fevers or weight loss, h/o cancer, IVDU, recent trauma. No concern for cauda equina, epidural abscess, or other serious cause of back pain. Conservative measures such as rest, ice/heat and pain medicine indicated with PCP follow-up if no improvement with conservative management.    New Prescriptions Discharge Medication List as of 03/20/2016  5:58 PM    I personally performed the services described in this documentation, which was scribed in my presence. The recorded information has been reviewed and is accurate.     Renne Crigler, PA-C 03/20/16 1820    Doug Sou, MD 03/20/16 2250

## 2016-03-20 NOTE — Progress Notes (Signed)
Pt lifted a piano last pm and now has low back pain radiating down his left leg. Offered ice but pt refused.

## 2016-03-22 ENCOUNTER — Ambulatory Visit (INDEPENDENT_AMBULATORY_CARE_PROVIDER_SITE_OTHER): Payer: Self-pay | Admitting: Orthopedic Surgery

## 2016-03-30 ENCOUNTER — Ambulatory Visit (INDEPENDENT_AMBULATORY_CARE_PROVIDER_SITE_OTHER): Payer: Self-pay | Admitting: Orthopaedic Surgery

## 2016-04-05 ENCOUNTER — Ambulatory Visit (INDEPENDENT_AMBULATORY_CARE_PROVIDER_SITE_OTHER): Payer: Self-pay | Admitting: Orthopedic Surgery

## 2016-04-12 ENCOUNTER — Telehealth (INDEPENDENT_AMBULATORY_CARE_PROVIDER_SITE_OTHER): Payer: Self-pay | Admitting: *Deleted

## 2016-04-12 NOTE — Telephone Encounter (Signed)
No pain meds. Please call patient.  

## 2016-04-12 NOTE — Telephone Encounter (Signed)
Pt. Called stating he could not make to to his appt. 11/1 and rescheduled but wanted a refill of tramadol. Call back number [phone].

## 2016-04-12 NOTE — Telephone Encounter (Signed)
See below, pls advise?  

## 2016-04-13 NOTE — Telephone Encounter (Signed)
Pt called advised him of below message. Pt verbalize understanding. Thank you

## 2016-04-14 ENCOUNTER — Ambulatory Visit (INDEPENDENT_AMBULATORY_CARE_PROVIDER_SITE_OTHER): Payer: Self-pay | Admitting: Orthopedic Surgery

## 2016-04-20 ENCOUNTER — Ambulatory Visit (INDEPENDENT_AMBULATORY_CARE_PROVIDER_SITE_OTHER): Payer: 59

## 2016-04-20 ENCOUNTER — Encounter (INDEPENDENT_AMBULATORY_CARE_PROVIDER_SITE_OTHER): Payer: Self-pay | Admitting: Sports Medicine

## 2016-04-20 ENCOUNTER — Ambulatory Visit (INDEPENDENT_AMBULATORY_CARE_PROVIDER_SITE_OTHER): Payer: 59 | Admitting: Sports Medicine

## 2016-04-20 VITALS — BP 128/85 | HR 90 | Ht 70.0 in | Wt 275.0 lb

## 2016-04-20 DIAGNOSIS — M25512 Pain in left shoulder: Secondary | ICD-10-CM

## 2016-04-20 DIAGNOSIS — M7542 Impingement syndrome of left shoulder: Secondary | ICD-10-CM

## 2016-04-20 MED ORDER — TRAMADOL HCL 50 MG PO TABS: 50.0000 mg | ORAL_TABLET | Freq: Four times a day (QID) | ORAL | 0 refills | Status: DC | PRN

## 2016-04-20 MED ORDER — METHYLPREDNISOLONE 4 MG PO TBPK: ORAL_TABLET | Freq: Every morning | ORAL | 0 refills | Status: DC

## 2016-04-20 NOTE — Progress Notes (Signed)
Mike AntiguaRonald D Adami - 50 y.o. male MRN [id number]  Date of birth: 13-Aug-1965  Office Visit Note: Visit Date: 04/20/2016 PCP: No PCP Per Patient Referred by: No ref. provider found  Subjective: Chief Complaint  Patient presents with  . Left Shoulder - Pain   HPI: Patient states left shoulder pain since Saturday, stated he was helping his mom move.  Pain in shoulder radiating into side of left neck a little and into left arm.  Hurts to raise left arm above his head.    ROS Otherwise per HPI.  Assessment & Plan: Visit Diagnoses:  1. Acute pain of left shoulder   2. Impingement syndrome of left shoulder     Plan: Findings:  Left shoulder pain consistent with subacromial impingement versus acute supraspinatus tear. Patient is not amenable to subacromial injection today & has been discouraged from taking anti-inflammatories in the past by his PCP. Will Start Him on a Medrol Dosepak & Provided Tramadol. Recommend against escalation to narcotics as he has had issues with this in the past. If any lack of improvement MRI of the shoulder should be considered.  Patient Instructions  Perform 15-20 minutes of the shoulder exercises provided. Do this on a daily basis. Do not drive while taking the Ultram. If any lack of improvement we will discuss obtaining an MRI of the shoulder & can consider an injection.    Meds & Orders:  Meds ordered this encounter  Medications  . methylPREDNISolone (MEDROL DOSEPAK) 4 MG TBPK tablet    Sig: Take by mouth AC breakfast.    Dispense:  21 tablet    Refill:  0  . traMADol (ULTRAM) 50 MG tablet    Sig: Take 1 tablet (50 mg total) by mouth every 6 (six) hours as needed.    Dispense:  30 tablet    Refill:  0    Orders Placed This Encounter  Procedures  . XR Shoulder Left  . XR Cervical Spine 2 or 3 views    Follow-up: Return in about 6 weeks (around 06/01/2016).   Procedures: No procedures performed  No notes on file   Clinical History: No specialty  comments available.  He reports that he quit smoking about 13 years ago. His smoking use included Cigarettes. He has a 63.00 pack-year smoking history. He has quit using smokeless tobacco. No results for input(s): HGBA1C, LABURIC in the last 8760 hours.  Objective:  VS:  HT:5\' 10"  (177.8 cm)   WT:275 lb (124.7 kg)  BMI:39.5    BP:128/85  HR:90bpm  TEMP: ( )  RESP:  Physical Exam  Constitutional: He appears well-developed and well-nourished. No distress.  Alert and appropriately interactive.  HENT:  Head: Normocephalic and atraumatic.  Pulmonary/Chest: Effort normal. No respiratory distress.  Skin: Skin is warm and dry. No rash noted. He is not diaphoretic. No erythema. No pallor.  Psychiatric: He has a normal mood and affect. His behavior is normal. Judgment and thought content normal.   Supraspinatous strength is intact however. Negative arm squeeze test. Minimal pain with brachial plexus squeeze. Upper extremity reflexes are symmetric. Sensation is intact in bilateral upper extremities. Radial pulses 2+/4. Negative Spurling's & Lhermitte's compression test. Ortho Exam Imaging: Xr Cervical Spine 2 Or 3 Views  Result Date: 04/23/2016 Findings: Prior cervical fusion with hardware that appears to be intact with slight backing out of the superior screw in C5 otherwise bridging osteophytes tween C4 & C5. No acute fracture dislocation. Impression: Stable hardware, worsening degenerative change of  the adjacent segment C4-C5  Xr Shoulder Left  Result Date: 04/23/2016 Findings: Shoulders overall well aligned. No acute fracture dislocation. Mild degenerative change of the [facility] Joint. Good subacromial space. Impression: Negative x-ray    Past Medical/Family/Surgical/Social History: Medications & Allergies reviewed per EMR Patient Active Problem List   Diagnosis Date Noted  . Impingement syndrome of left shoulder 04/20/2016   Past Medical History:  Diagnosis Date  . Arthritis   . Penile  lesion    Family History  Problem Relation Age of Onset  . Hypertension Mother   . Diabetes Mother   . Hypertension Father   . Cancer Father    Past Surgical History:  Procedure Laterality Date  . ANTERIOR CERVICAL DECOMP/DISCECTOMY FUSION  12-31-2002   C5 -- C7  . PENILE BIOPSY N/A 08/23/2012   Procedure: EXCISIONAL BIOPSY OF PENILE LESION;  Surgeon: [name], MD;  [facility] [place];  Service: Urology;  Laterality: N/A;  EXCISIONAL BIOPSY OF PENILE LESION    . POSTERIOR FUSION CERVICAL SPINE  02-26-2005   C5 -- C7   Social History   Occupational History  . Not on file.   Social History Main Topics  . Smoking status: Former Smoker    Packs/day: 3.00    Years: 21.00    Types: Cigarettes    Quit date: 08/18/2002  . Smokeless tobacco: Former NeurosurgeonUser  . Alcohol use Yes     Comment: RARE  . Drug use: No  . Sexual activity: Not on file

## 2016-04-20 NOTE — Patient Instructions (Signed)
Perform 15-20 minutes of the shoulder exercises provided. Do this on a daily basis. Do not drive while taking the Ultram. If any lack of improvement we will discuss obtaining an MRI of the shoulder & can consider an injection.

## 2016-04-28 ENCOUNTER — Encounter (INDEPENDENT_AMBULATORY_CARE_PROVIDER_SITE_OTHER): Payer: Self-pay | Admitting: Orthopedic Surgery

## 2016-04-28 ENCOUNTER — Ambulatory Visit (INDEPENDENT_AMBULATORY_CARE_PROVIDER_SITE_OTHER): Payer: 59 | Admitting: Orthopedic Surgery

## 2016-04-28 DIAGNOSIS — M25561 Pain in right knee: Secondary | ICD-10-CM | POA: Diagnosis not present

## 2016-04-28 DIAGNOSIS — G8929 Other chronic pain: Secondary | ICD-10-CM | POA: Diagnosis not present

## 2016-04-28 DIAGNOSIS — M79644 Pain in right finger(s): Secondary | ICD-10-CM

## 2016-04-28 MED ORDER — TRAMADOL HCL 50 MG PO TABS: 50.0000 mg | ORAL_TABLET | Freq: Four times a day (QID) | ORAL | 0 refills | Status: DC | PRN

## 2016-04-28 NOTE — Progress Notes (Signed)
Office Visit Note   Patient: Mike Watkins           Date of Birth: [birth year]           MRN: [id number] Visit Date: 04/28/2016 Requested by: Lewis MoccasinElizabeth R Dewey, MD [address] [place] [id number] PCP: No PCP Per Patient  Subjective: Chief Complaint  Patient presents with  . Right Knee - Pain  . Right Hand - Pain    HPI Mike Watkins is a [age] year old patient with right hand pain and right knee pain.  He has long-standing right knee arthritis.  Has had injections in the past but is not really at the point where he wants to get an injection.  He does want to get his knee replaced sometime next year.  She also describes several month history of right hand pain.  He is right-hand-dominant.  Describes a cyst right at the crease of his third finger MCP joint region.  Denies any numbness and tingling in the finger but does report pain when he tries to shift gears with his right hand.              Review of Systems All systems reviewed are negative as they relate to the chief complaint within the history of present illness.  Patient denies  fevers or chills.    Assessment & Plan: Visit Diagnoses:  1. Chronic pain of right knee   2. Pain in right finger(s)     Plan: Impression is right third finger tendon sheath cyst.  This is examined under ultrasound and he does have a cystic mass right there on the MCP flexion crease slightly distal.  It is superior to the neurovascular bundle.  It is on the radial side of the tendon.  This is a cyst which may be amenable to aspiration or excision.  Really depends on how symptomatic it is for him.  We discussed both today and he would like to get it excised sometime in the near future after Christmas.  I can be done as an outpatient with about 2-3 days of work best.  Patient understands and he will call to schedule.  I did refill his tramadol 1.  The knee arthritis is significant and will likely need to be addressed when he is symptomatic enough to  have that done  Follow-Up Instructions: Return if symptoms worsen or fail to improve.   Orders:  No orders of the defined types were placed in this encounter.  Meds ordered this encounter  Medications  . traMADol (ULTRAM) 50 MG tablet    Sig: Take 1 tablet (50 mg total) by mouth every 6 (six) hours as needed.    Dispense:  60 tablet    Refill:  0      Procedures: No procedures performed   Clinical Data: No additional findings.  Objective: Vital Signs: There were no vitals taken for this visit.  Physical Exam  Constitutional: He appears well-developed.  HENT:  Head: Normocephalic.  Eyes: EOM are normal.  Neck: Normal range of motion.  Cardiovascular: Normal rate.   Pulmonary/Chest: Effort normal.  Neurological: He is alert.  Skin: Skin is warm.  Psychiatric: He has a normal mood and affect.    Ortho Exam examination of the hand demonstrates full composite flexion and extension.  No paresthesias in the third fourth or fifth finger.  He has about a 1 cm mass which is ballotable and just below the skin surface just distal to the MCP  flexion crease of the third finger.  FDS FDP function is intact of that third finger.  There are no other pits cords or masses in the hand palmar region.  Right knee has reasonable range of motion no effusion global joint line tenderness intact extensor mechanism palpable pedal pulses with some measure of edema in both legs.  Specialty Comments:  No specialty comments available.  Imaging: No results found.   PMFS History: Patient Active Problem List   Diagnosis Date Noted  . Impingement syndrome of left shoulder 04/20/2016   Past Medical History:  Diagnosis Date  . Arthritis   . Penile lesion     Family History  Problem Relation Age of Onset  . Hypertension Mother   . Diabetes Mother   . Hypertension Father   . Cancer Father     Past Surgical History:  Procedure Laterality Date  . ANTERIOR CERVICAL DECOMP/DISCECTOMY FUSION   12-31-2002   C5 -- C7  . PENILE BIOPSY N/A 08/23/2012   Procedure: EXCISIONAL BIOPSY OF PENILE LESION;  Surgeon: Mike Cageaniel Young Woodruff, MD;  [facility];  Service: Urology;  Laterality: N/A;  EXCISIONAL BIOPSY OF PENILE LESION    . POSTERIOR FUSION CERVICAL SPINE  02-26-2005   C5 -- C7   Social History   Occupational History  . Not on file.   Social History Main Topics  . Smoking status: Former Smoker    Packs/day: 3.00    Years: 21.00    Types: Cigarettes    Quit date: 08/18/2002  . Smokeless tobacco: Former NeurosurgeonUser  . Alcohol use Yes     Comment: RARE  . Drug use: No  . Sexual activity: Not on file

## 2016-05-01 ENCOUNTER — Ambulatory Visit (HOSPITAL_COMMUNITY)
Admission: EM | Admit: 2016-05-01 | Discharge: 2016-05-01 | Disposition: A | Discharge status: Home / Self Care | Payer: 59 | Attending: Radiology | Admitting: Radiology

## 2016-05-01 ENCOUNTER — Encounter (HOSPITAL_COMMUNITY): Payer: Self-pay | Admitting: Emergency Medicine

## 2016-05-01 DIAGNOSIS — J069 Acute upper respiratory infection, unspecified: Secondary | ICD-10-CM | POA: Diagnosis not present

## 2016-05-01 HISTORY — DX: Essential (primary) hypertension: I10

## 2016-05-01 MED ORDER — LEVOFLOXACIN 750 MG PO TABS: 750.0000 mg | ORAL_TABLET | Freq: Every day | ORAL | 0 refills | Status: AC

## 2016-05-01 MED ORDER — LEVOFLOXACIN 750 MG PO TABS: 750.0000 mg | ORAL_TABLET | Freq: Every day | ORAL | 0 refills | Status: DC

## 2016-05-01 MED ORDER — DEXAMETHASONE SODIUM PHOSPHATE 10 MG/ML IJ SOLN
10.0000 mg | Freq: Once | INTRAMUSCULAR | Status: AC
  Administered 2016-05-01: 10 mg via INTRAMUSCULAR

## 2016-05-01 MED ORDER — BENZONATATE 100 MG PO CAPS: 100.0000 mg | ORAL_CAPSULE | Freq: Three times a day (TID) | ORAL | 0 refills | Status: DC

## 2016-05-01 MED ORDER — DEXAMETHASONE SODIUM PHOSPHATE 10 MG/ML IJ SOLN
INTRAMUSCULAR | Status: AC
  Filled 2016-05-01: qty 1

## 2016-05-01 NOTE — Discharge Instructions (Signed)
Continue to push fluids and take over the counter medications as directed on the back of the box for symptomatic relief.  ° °

## 2016-05-01 NOTE — ED Triage Notes (Signed)
Here for cold sx onset 1 week associated w/prod cough, runny nose, nasal congestion, fevers, chills  Taking Nyquil, Mucinex w/no relief.   A&O x4... NAD

## 2016-05-01 NOTE — ED Provider Notes (Signed)
CSN: [id number]     Arrival date & time 05/01/16  1853 History   None    Chief Complaint  Patient presents with  . URI   (Consider location/radiation/quality/duration/timing/severity/associated sxs/prior Treatment) 50 y.o. male presents with cough, headache, fevers and chills X 1 week . Condition is acute in nature. Condition is made better by nyquil. Condition is made worse by nothing.      Past Medical History:  Diagnosis Date  . Arthritis   . Hypertension   . Penile lesion    Past Surgical History:  Procedure Laterality Date  . ANTERIOR CERVICAL DECOMP/DISCECTOMY FUSION  12-31-2002   C5 -- C7  . PENILE BIOPSY N/A 08/23/2012   Procedure: EXCISIONAL BIOPSY OF PENILE LESION;  Surgeon: [name], MD;  [facility] [place];  Service: Urology;  Laterality: N/A;  EXCISIONAL BIOPSY OF PENILE LESION    . POSTERIOR FUSION CERVICAL SPINE  02-26-2005   C5 -- C7   Family History  Problem Relation Age of Onset  . Hypertension Mother   . Diabetes Mother   . Hypertension Father   . Cancer Father    Social History  Substance Use Topics  . Smoking status: Former Smoker    Packs/day: 3.00    Years: 21.00    Types: Cigarettes    Quit date: 08/18/2002  . Smokeless tobacco: Former NeurosurgeonUser  . Alcohol use Yes     Comment: RARE    Review of Systems  Constitutional: Positive for chills and fever.  HENT: Positive for congestion and sore throat. Negative for ear pain.   Eyes: Negative for pain and visual disturbance.  Respiratory: Positive for cough. Negative for shortness of breath.   Cardiovascular: Negative for chest pain and palpitations.  Gastrointestinal: Negative for abdominal pain and vomiting.  Genitourinary: Negative for dysuria and hematuria.  Musculoskeletal: Negative for arthralgias and back pain.  Skin: Negative for color change and rash.  All other systems reviewed and are negative.   Allergies  Penicillins and Sulfa antibiotics  Home  Medications   Prior to Admission medications   Medication Sig Start Date End Date Taking? Authorizing Provider  benzonatate (TESSALON) 100 MG capsule Take 1 capsule (100 mg total) by mouth every 8 (eight) hours. 05/01/16   Alene MiresJennifer C Omohundro, NP  ibuprofen (ADVIL,MOTRIN) 600 MG tablet Take 1 tablet (600 mg total) by mouth every 6 (six) hours as needed. Patient not taking: Reported on 05/01/2016 10/25/15   Barrett HenleNicole Elizabeth Nadeau, PA-C  levofloxacin (LEVAQUIN) 750 MG tablet Take 1 tablet (750 mg total) by mouth daily. 05/01/16 05/06/16  Alene MiresJennifer C Omohundro, NP  methocarbamol (ROBAXIN) 500 MG tablet Take 1 tablet (500 mg total) by mouth 2 (two) times daily. 10/25/15   Barrett HenleNicole Elizabeth Nadeau, PA-C  methylPREDNISolone (MEDROL DOSEPAK) 4 MG TBPK tablet Take by mouth AC breakfast. Patient not taking: Reported on 05/01/2016 04/20/16   Andrena MewsMichael D Rigby, DO  traMADol (ULTRAM) 50 MG tablet Take 1 tablet (50 mg total) by mouth every 6 (six) hours as needed. 04/28/16   Cammy CopaScott Gregory Dean, MD   Meds Ordered and Administered this Visit   Medications  dexamethasone (DECADRON) injection 10 mg (10 mg Intramuscular Given 05/01/16 2005)    BP 136/84 (BP Location: Left Arm)   Pulse 112   Temp 98.6 F (37 C) (Oral)   Resp 18   SpO2 96%  No data found.   Physical Exam  Constitutional: He is oriented to person, place, and time. He appears well-developed and  well-nourished.  HENT:  Head: Normocephalic.  Neck: Normal range of motion.  Cardiovascular: Normal rate and regular rhythm.   Pulmonary/Chest: Effort normal and breath sounds normal.  Musculoskeletal: Normal range of motion.  Neurological: He is alert and oriented to person, place, and time.  Skin: Skin is dry.  Psychiatric: He has a normal mood and affect.  Nursing note and vitals reviewed.   Urgent Care Course   Clinical Course     Procedures (including critical care time)  Labs Review Labs Reviewed - No data to display  Imaging  Review No results found.        MDM   1. Upper respiratory tract infection, unspecified type        Alene MiresJennifer C Omohundro, NP 05/01/16 2011

## 2016-05-10 ENCOUNTER — Telehealth (INDEPENDENT_AMBULATORY_CARE_PROVIDER_SITE_OTHER): Payer: Self-pay | Admitting: *Deleted

## 2016-05-10 NOTE — Telephone Encounter (Signed)
Rx refill

## 2016-05-10 NOTE — Telephone Encounter (Signed)
PT. Called stated over the weekend he went to his mothers and left his tramadol there. His mother lives 2 hours away and is asking for a refill just until his surgery that hasn't been set up yet. Pt call back number is [phone]

## 2016-05-10 NOTE — Telephone Encounter (Signed)
no

## 2016-05-11 ENCOUNTER — Ambulatory Visit (HOSPITAL_COMMUNITY)
Admission: EM | Admit: 2016-05-11 | Discharge: 2016-05-11 | Disposition: A | Discharge status: Home / Self Care | Payer: 59 | Attending: Emergency Medicine | Admitting: Emergency Medicine

## 2016-05-11 ENCOUNTER — Encounter (HOSPITAL_COMMUNITY): Payer: Self-pay | Admitting: Emergency Medicine

## 2016-05-11 DIAGNOSIS — M79641 Pain in right hand: Secondary | ICD-10-CM

## 2016-05-11 DIAGNOSIS — Z760 Encounter for issue of repeat prescription: Secondary | ICD-10-CM

## 2016-05-11 MED ORDER — TRAMADOL HCL 50 MG PO TABS: 50.0000 mg | ORAL_TABLET | Freq: Four times a day (QID) | ORAL | 0 refills | Status: DC | PRN

## 2016-05-11 NOTE — ED Provider Notes (Signed)
[facility]    CSN: [id number] Arrival date & time: 05/11/16  1715     History   Chief Complaint Chief Complaint  Patient presents with  . Medication Refill    HPI Mike Watkins is a 50 y.o. male.   HPI  He is a 50 year old man here for medication refill. He takes tramadol as needed for knee pain and right hand pain. He is primarily using it for his right hand pain currently. He has cyst along the tendon sheath of his finger. Orthopedic doctor is planning on removing it after Christmas. He saw the orthopedic doctor on November 15 and was prescribed 60 tablets of tramadol. He went to visit his mother, and left the pills at her house which is 4-1/2 hours away. He was told not to take ibuprofen due to his blood pressure. He has been taking Tylenol with some mild improvement.  Past Medical History:  Diagnosis Date  . Arthritis   . Hypertension   . Penile lesion     Patient Active Problem List   Diagnosis Date Noted  . Impingement syndrome of left shoulder 04/20/2016    Past Surgical History:  Procedure Laterality Date  . ANTERIOR CERVICAL DECOMP/DISCECTOMY FUSION  12-31-2002   C5 -- C7  . PENILE BIOPSY N/A 08/23/2012   Procedure: EXCISIONAL BIOPSY OF PENILE LESION;  Surgeon: [name], MD;  [facility] [place];  Service: Urology;  Laterality: N/A;  EXCISIONAL BIOPSY OF PENILE LESION    . POSTERIOR FUSION CERVICAL SPINE  02-26-2005   C5 -- C7       Home Medications    Prior to Admission medications   Medication Sig Start Date End Date Taking? Authorizing Provider  benzonatate (TESSALON) 100 MG capsule Take 1 capsule (100 mg total) by mouth every 8 (eight) hours. 05/01/16   Alene MiresJennifer C Omohundro, NP  methocarbamol (ROBAXIN) 500 MG tablet Take 1 tablet (500 mg total) by mouth 2 (two) times daily. 10/25/15   Barrett HenleNicole Elizabeth Nadeau, PA-C  traMADol (ULTRAM) 50 MG tablet Take 1 tablet (50 mg total) by mouth every 6 (six) hours as  needed for severe pain. 05/11/16   Charm RingsErin J Inna Tisdell, MD    Family History Family History  Problem Relation Age of Onset  . Hypertension Mother   . Diabetes Mother   . Hypertension Father   . Cancer Father     Social History Social History  Substance Use Topics  . Smoking status: Former Smoker    Packs/day: 3.00    Years: 21.00    Types: Cigarettes    Quit date: 08/18/2002  . Smokeless tobacco: Former NeurosurgeonUser  . Alcohol use Yes     Comment: RARE     Allergies   Penicillins and Sulfa antibiotics   Review of Systems Review of Systems As in history of present illness  Physical Exam Triage Vital Signs ED Triage Vitals  Enc Vitals Group     BP 05/11/16 1748 143/73     Pulse Rate 05/11/16 1748 116     Resp 05/11/16 1748 18     Temp 05/11/16 1748 98.7 F (37.1 C)     Temp Source 05/11/16 1748 Oral     SpO2 05/11/16 1748 95 %     Weight --      Height --      Head Circumference --      Peak Flow --      Pain Score 05/11/16 1752 6  Pain Loc --      Pain Edu? --      Excl. in GC? --    No data found.   Updated Vital Signs BP 143/73 (BP Location: Left Arm)   Pulse 116   Temp 98.7 F (37.1 C) (Oral)   Resp 18   SpO2 95%   Visual Acuity Right Eye Distance:   Left Eye Distance:   Bilateral Distance:    Right Eye Near:   Left Eye Near:    Bilateral Near:     Physical Exam  Constitutional: He is oriented to person, place, and time. He appears well-developed and well-nourished. No distress.  Cardiovascular: Normal rate.   Pulmonary/Chest: Effort normal.  Musculoskeletal:  Right hand: He does have a cystic lesion at the base of his finger.  Neurological: He is alert and oriented to person, place, and time.     UC Treatments / Results  Labs (all labs ordered are listed, but only abnormal results are displayed) Labs Reviewed - No data to display  EKG  EKG Interpretation None       Radiology No results found.  Procedures Procedures (including  critical care time)  Medications Ordered in UC Medications - No data to display   Initial Impression / Assessment and Plan / UC Course  I have reviewed the triage vital signs and the nursing notes.  Pertinent labs & imaging results that were available during my care of the patient were reviewed by me and considered in my medical decision making (see chart for details).  Clinical Course     Discussed with the patient that I'm unable to provide him 30 days of pain medication. I will give him 15 tablets of tramadol. Discussed taking 2 extra strength Tylenol every 8 hours to help with pain. He thinks a cousin may be able to pick up the medication at his mother's house for him.  Final Clinical Impressions(s) / UC Diagnoses   Final diagnoses:  Medication refill  Right hand pain    New Prescriptions Current Discharge Medication List       Charm RingsErin J Kattleya Kuhnert, MD 05/11/16 [phone]

## 2016-05-11 NOTE — ED Triage Notes (Signed)
[facility] with a complaint of needing a refill on his tramadol. The patient reported that he left them at his mother's house which is 4.5 hours away. The patient reported that he was taking the medication for hand pain. He stated that he contacted the orthopedic and he denied the refill.

## 2016-05-11 NOTE — Discharge Instructions (Signed)
I can give you 15 tablets of tramadol. If you can get a family member to bring the medication back for you that would be wonderful. Take 2 extra strength Tylenol every 8 hours to help with baseline pain. Follow-up with your orthopedic doctor as needed.

## 2016-05-12 MED ORDER — TRAMADOL HCL 50 MG PO TABS: ORAL_TABLET | ORAL | 0 refills | Status: DC

## 2016-05-12 NOTE — Telephone Encounter (Signed)
Per Dr August Saucerean, now ok to fill tramadol.  I ok'd Rx per Dr August Saucerean to fill on Sat. 12/2/17since pt just got a Rx yesterday- [facility]. Surgery order written

## 2016-05-18 ENCOUNTER — Telehealth (INDEPENDENT_AMBULATORY_CARE_PROVIDER_SITE_OTHER): Payer: Self-pay

## 2016-05-18 NOTE — Telephone Encounter (Signed)
Patient left voice mail wanting to schedule surgery for his hand.  [phone]

## 2016-05-24 ENCOUNTER — Encounter (INDEPENDENT_AMBULATORY_CARE_PROVIDER_SITE_OTHER): Payer: Self-pay | Admitting: Orthopaedic Surgery

## 2016-05-24 ENCOUNTER — Ambulatory Visit (INDEPENDENT_AMBULATORY_CARE_PROVIDER_SITE_OTHER): Payer: 59 | Admitting: Orthopaedic Surgery

## 2016-05-24 ENCOUNTER — Ambulatory Visit (INDEPENDENT_AMBULATORY_CARE_PROVIDER_SITE_OTHER): Payer: 59

## 2016-05-24 DIAGNOSIS — M25562 Pain in left knee: Secondary | ICD-10-CM | POA: Diagnosis not present

## 2016-05-24 MED ORDER — TRAMADOL HCL 50 MG PO TABS: 50.0000 mg | ORAL_TABLET | Freq: Three times a day (TID) | ORAL | 0 refills | Status: DC | PRN

## 2016-05-24 NOTE — Telephone Encounter (Signed)
[facility]

## 2016-05-24 NOTE — Progress Notes (Signed)
Office Visit Note   Patient: Mike Watkins           Date of Birth: [birth year]           MRN: [id number] Visit Date: 05/24/2016              Requested by: Lewis MoccasinElizabeth R Dewey, MD [address] [place], [place] [id number] PCP: Maryelizabeth RowanEWEY,ELIZABETH, MD   Assessment & Plan: Visit Diagnoses:  1. Acute pain of left knee     Plan: Impression is left knee contusion. Recommended symptomatic treatment. Ice elevation tramadol was refilled.  Follow-Up Instructions: Return if symptoms worsen or fail to improve.   Orders:  Orders Placed This Encounter  Procedures  . XR KNEE 3 VIEW LEFT   Meds ordered this encounter  Medications  . traMADol (ULTRAM) 50 MG tablet    Sig: Take 1 tablet (50 mg total) by mouth 3 (three) times daily as needed for severe pain.    Dispense:  30 tablet    Refill:  0      Procedures: No procedures performed   Clinical Data: No additional findings.   Subjective: Chief Complaint  Patient presents with  . Left Knee - Pain    Patient is a 50 year old gentleman who comes in with left knee pain since yesterday. He had a fall directly onto the patella. He has painful popping. He denies any swelling or radiation of pain. Pain is mainly over the medial retinacular region.    Review of Systems  Constitutional: Negative.   HENT: Negative.   Eyes: Negative.   Respiratory: Negative.   Cardiovascular: Negative.   Gastrointestinal: Negative.   Endocrine: Negative.   Genitourinary: Negative.   Musculoskeletal: Negative.   Skin: Negative.   Allergic/Immunologic: Negative.   Neurological: Negative.   Hematological: Negative.   Psychiatric/Behavioral: Negative.      Objective: Vital Signs: There were no vitals taken for this visit.  Physical Exam  Constitutional: He is oriented to person, place, and time. He appears well-developed and well-nourished.  HENT:  Head: Normocephalic and atraumatic.  Eyes: EOM are normal.  Neck: Neck supple.    Cardiovascular: Intact distal pulses.   Pulmonary/Chest: Effort normal.  Abdominal: Soft.  Neurological: He is alert and oriented to person, place, and time.  Skin: Skin is warm.  Psychiatric: He has a normal mood and affect. His behavior is normal. Judgment and thought content normal.  Nursing note and vitals reviewed.   Ortho Exam Exam of the left knee shows no joint effusion. He has regular normal range of motion. Collaterals and cruciates are stable. He is mainly tender to the medial retinacular region. Patella mobility is normal. Specialty Comments:  No specialty comments available.  Imaging: Xr Knee 3 View Left  Result Date: 05/24/2016 No acute findings.    PMFS History: Patient Active Problem List   Diagnosis Date Noted  . Impingement syndrome of left shoulder 04/20/2016   Past Medical History:  Diagnosis Date  . Arthritis   . Hypertension   . Penile lesion     Family History  Problem Relation Age of Onset  . Hypertension Mother   . Diabetes Mother   . Hypertension Father   . Cancer Father     Past Surgical History:  Procedure Laterality Date  . ANTERIOR CERVICAL DECOMP/DISCECTOMY FUSION  12-31-2002   C5 -- C7  . PENILE BIOPSY N/A 08/23/2012   Procedure: EXCISIONAL BIOPSY OF PENILE LESION;  Surgeon: [name], MD;  Location:  [facility];  Service: Urology;  Laterality: N/A;  EXCISIONAL BIOPSY OF PENILE LESION    . POSTERIOR FUSION CERVICAL SPINE  02-26-2005   C5 -- C7   Social History   Occupational History  . Not on file.   Social History Main Topics  . Smoking status: Former Smoker    Packs/day: 3.00    Years: 21.00    Types: Cigarettes    Quit date: 08/18/2002  . Smokeless tobacco: Former NeurosurgeonUser  . Alcohol use Yes     Comment: RARE  . Drug use: No  . Sexual activity: Not on file

## 2016-05-31 ENCOUNTER — Telehealth (INDEPENDENT_AMBULATORY_CARE_PROVIDER_SITE_OTHER): Payer: Self-pay | Admitting: Orthopedic Surgery

## 2016-05-31 NOTE — Telephone Encounter (Signed)
Okay to refill tramadol 1 by mouth daily as needed for pain #30 with no refills

## 2016-05-31 NOTE — Telephone Encounter (Signed)
Patient requesting refill on Tramadol for pain in R hand.  Please call in @ [facility] on [address]. He states surgery is scheduled for 08/02/16.

## 2016-05-31 NOTE — Telephone Encounter (Signed)
Rx request 

## 2016-06-01 ENCOUNTER — Telehealth (INDEPENDENT_AMBULATORY_CARE_PROVIDER_SITE_OTHER): Payer: Self-pay | Admitting: Orthopedic Surgery

## 2016-06-01 MED ORDER — TRAMADOL HCL 50 MG PO TABS: ORAL_TABLET | ORAL | 0 refills | Status: DC

## 2016-06-01 NOTE — Telephone Encounter (Signed)
IC pt and advised [facility] pharm, they do have the Rx, but it is too soon to fill.  They can fill it in three days. He will followup with pharm

## 2016-06-01 NOTE — Telephone Encounter (Signed)
[facility] pharm with Rx.  

## 2016-06-01 NOTE — Telephone Encounter (Signed)
Patient called advised he called pharmacy and his Rx is not there. Patient asked for a call when Rx is called back in. The number to contact him is [phone]

## 2016-06-02 ENCOUNTER — Inpatient Hospital Stay (INDEPENDENT_AMBULATORY_CARE_PROVIDER_SITE_OTHER): Payer: 59

## 2016-06-02 ENCOUNTER — Encounter (INDEPENDENT_AMBULATORY_CARE_PROVIDER_SITE_OTHER): Payer: Self-pay | Admitting: Sports Medicine

## 2016-06-02 ENCOUNTER — Ambulatory Visit (INDEPENDENT_AMBULATORY_CARE_PROVIDER_SITE_OTHER): Payer: 59 | Admitting: Sports Medicine

## 2016-06-02 VITALS — BP 126/79 | HR 90 | Ht 70.0 in | Wt 260.0 lb

## 2016-06-02 DIAGNOSIS — M79644 Pain in right finger(s): Secondary | ICD-10-CM

## 2016-06-02 DIAGNOSIS — M67441 Ganglion, right hand: Secondary | ICD-10-CM | POA: Insufficient documentation

## 2016-06-02 MED ORDER — ACETAMINOPHEN-CODEINE #3 300-30 MG PO TABS: 1.0000 | ORAL_TABLET | Freq: Four times a day (QID) | ORAL | 0 refills | Status: DC | PRN

## 2016-06-02 NOTE — Progress Notes (Signed)
Mike Watkins - 50 y.o. male MRN [id number]  Date of birth: 1965/07/02  Office Visit Note: Visit Date: 06/02/2016 PCP: Maryelizabeth RowanEWEY,ELIZABETH, MD Referred by: Lewis Moccasinewey, Elizabeth R, MD  Subjective: Chief Complaint  Patient presents with  . Right Hand - Follow-up  . Follow-up    Patient states right hand pain, states he has a cyst on his middle finger that is being removed by Dr. August Saucerean, but he can't apply pressure to right hand.  Pain is radiating into his right wrist.   HPI: Persistently worsening right third finger pain.  Scheduled to have a cyst excision by Dr. August Saucerean in February.  He has had worsening pain within the wrist as well as in radial and ulnar aspect of the proximal hand.  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  He has been seen by multiple providers recently due to this issue and lack of efficacy with tramadol. ROS:  Otherwise per HPI.   Clinical History: No specialty comments available.  He reports that he quit smoking about 13 years ago. His smoking use included Cigarettes. He has a 63.00 pack-year smoking history. He has quit using smokeless tobacco.  No results for input(s): HGBA1C, LABURIC in the last 8760 hours.  Assessment & Plan: Visit Diagnoses:    ICD-9-CM ICD-10-CM   1. Pain in right finger(s) 729.5 M79.644 US Extrem Up Right Ltd  2. Ganglion cyst of flexor tendon sheath of finger of right hand 727.42 M67.441 US Extrem Up Right Ltd    Plan: Large cystic structure appreciated on MSK US today consistent with flexor tendon sheath cyst.  Agree with excision.  The pain is proximal wrist and hand does not appear to be coming from any type of median or ulnar neuropathy is median nerve did measure 0.8 and 0.9 cm which is within normal limits.  Wrist brace provided to provide additional support as he does seem to be favoring his hand and short course of Tylenol No. 3 provided.  [place] controlled substance database was  checked in consistent filling behaviors noted.   Follow-up: Return if symptoms worsen or fail to improve.  Meds:  Meds ordered this encounter  Medications  . acetaminophen-codeine (TYLENOL #3) 300-30 MG tablet    Sig: Take 1 tablet by mouth every 6 (six) hours as needed for moderate pain.    Dispense:  40 tablet    Refill:  0   Procedures: No notes on file   Objective:  VS:  HT:5\' 10"  (177.8 cm)   WT:260 lb (117.9 kg)  BMI:37.4    BP:126/79  HR:90bpm  TEMP: ( )  RESP:  Physical Exam:  Right hands overall normal-appearing with quite large fingers.  He does have palpable cystic structure that is over the flexor tendon of the third finger.  This is markedly painful for him.  He also has pain with palpation of the DRUJ on both the radial and ulnar aspects.  Negative Finkelstein testing.  Some pain carpal tunnel compression test and some discomfort with Tinel's over the median nerve as well as ulnar nerve.  Pain with Phalen's testing.  Grip strength is diminished on the right compared to the left.  No significant skin changes. Imaging: [place] Extrem Up Right Ltd  Result Date: 06/02/2016 Right wrist ultrasound performed today.  Ulnar nerve measures 0.8-0.9 cm.  There is a small amount of dilation of the nerve but minimal.  Small amount of increased intra-articular  fluid with very large noted cyst along the third flexor tendon measuring 0.55 cm and transverse views and 0.9 cm in longitudinal views with a total volume equating to ~0.435mL  Xr Knee 3 View Left  Result Date: 05/24/2016 No acute findings.   Past Medical/Family/Surgical/Social History: Medications & Allergies reviewed per EMR Patient Active Problem List   Diagnosis Date Noted  . Ganglion cyst of flexor tendon sheath of finger of left hand 06/02/2016  . Impingement syndrome of left shoulder 04/20/2016   Past Medical History:  Diagnosis Date  . Arthritis   . Hypertension   . Penile lesion    Family History  Problem  Relation Age of Onset  . Hypertension Mother   . Diabetes Mother   . Hypertension Father   . Cancer Father    Past Surgical History:  Procedure Laterality Date  . ANTERIOR CERVICAL DECOMP/DISCECTOMY FUSION  12-31-2002   C5 -- C7  . PENILE BIOPSY N/A 08/23/2012   Procedure: EXCISIONAL BIOPSY OF PENILE LESION;  Surgeon: [name], MD;  [facility];  Service: Urology;  Laterality: N/A;  EXCISIONAL BIOPSY OF PENILE LESION    . POSTERIOR FUSION CERVICAL SPINE  02-26-2005   C5 -- C7   Social History   Occupational History  . Not on file.   Social History Main Topics  . Smoking status: Former Smoker    Packs/day: 3.00    Years: 21.00    Types: Cigarettes    Quit date: 08/18/2002  . Smokeless tobacco: Former NeurosurgeonUser  . Alcohol use Yes     Comment: RARE  . Drug use: No  . Sexual activity: Not on file

## 2016-06-19 ENCOUNTER — Emergency Department (HOSPITAL_COMMUNITY)
Admission: EM | Admit: 2016-06-19 | Discharge: 2016-06-19 | Disposition: A | Discharge status: Home / Self Care | Payer: 59 | Attending: Emergency Medicine | Admitting: Emergency Medicine

## 2016-06-19 ENCOUNTER — Encounter (HOSPITAL_COMMUNITY): Payer: Self-pay | Admitting: Emergency Medicine

## 2016-06-19 ENCOUNTER — Emergency Department (HOSPITAL_COMMUNITY): Payer: 59

## 2016-06-19 DIAGNOSIS — X501XXA Overexertion from prolonged static or awkward postures, initial encounter: Secondary | ICD-10-CM | POA: Insufficient documentation

## 2016-06-19 DIAGNOSIS — Y999 Unspecified external cause status: Secondary | ICD-10-CM | POA: Insufficient documentation

## 2016-06-19 DIAGNOSIS — Z87891 Personal history of nicotine dependence: Secondary | ICD-10-CM | POA: Insufficient documentation

## 2016-06-19 DIAGNOSIS — Y939 Activity, unspecified: Secondary | ICD-10-CM | POA: Insufficient documentation

## 2016-06-19 DIAGNOSIS — Y929 Unspecified place or not applicable: Secondary | ICD-10-CM | POA: Diagnosis not present

## 2016-06-19 DIAGNOSIS — S8392XA Sprain of unspecified site of left knee, initial encounter: Secondary | ICD-10-CM | POA: Diagnosis not present

## 2016-06-19 DIAGNOSIS — I10 Essential (primary) hypertension: Secondary | ICD-10-CM | POA: Insufficient documentation

## 2016-06-19 DIAGNOSIS — Z79899 Other long term (current) drug therapy: Secondary | ICD-10-CM | POA: Insufficient documentation

## 2016-06-19 DIAGNOSIS — S8992XA Unspecified injury of left lower leg, initial encounter: Secondary | ICD-10-CM | POA: Diagnosis present

## 2016-06-19 MED ORDER — ACETAMINOPHEN-CODEINE #4 300-60 MG PO TABS: 1.0000 | ORAL_TABLET | ORAL | 0 refills | Status: DC | PRN

## 2016-06-19 NOTE — ED Provider Notes (Signed)
[facility]-EMERGENCY DEPT Provider Note   CSN: [id number] Arrival date & time: 06/19/16  0851     History   Chief Complaint Chief Complaint  Patient presents with  . Knee Pain    HPI Mike Watkins is a 51 y.o. male.  51 year old male presents with left knee injury that occurred when he was stepping out of his truck yesterday. Follow up pop when he stepped down and complains of sharp pain to the superior medial surface of his left knee. Denies any swelling of the knee. No numbness or tingling to his left foot. Has used ibuprofen as well as Tylenol No. 4 with relief. Denies any prior history of any injury. Pain is better with rest.      Past Medical History:  Diagnosis Date  . Arthritis   . Hypertension   . Penile lesion     Patient Active Problem List   Diagnosis Date Noted  . Ganglion cyst of flexor tendon sheath of finger of left hand 06/02/2016  . Impingement syndrome of left shoulder 04/20/2016    Past Surgical History:  Procedure Laterality Date  . ANTERIOR CERVICAL DECOMP/DISCECTOMY FUSION  12-31-2002   C5 -- C7  . PENILE BIOPSY N/A 08/23/2012   Procedure: EXCISIONAL BIOPSY OF PENILE LESION;  Surgeon: Milford Cage, MD;  Location: [facility];  Service: Urology;  Laterality: N/A;  EXCISIONAL BIOPSY OF PENILE LESION    . POSTERIOR FUSION CERVICAL SPINE  02-26-2005   C5 -- C7       Home Medications    Prior to Admission medications   Medication Sig Start Date End Date Taking? Authorizing Provider  acetaminophen-codeine (TYLENOL #3) 300-30 MG tablet Take 1 tablet by mouth every 6 (six) hours as needed for moderate pain. 06/02/16   [name], DO  benzonatate (TESSALON) 100 MG capsule Take 1 capsule (100 mg total) by mouth every 8 (eight) hours. Patient not taking: Reported on 06/02/2016 05/01/16   Alene Mires, NP  methocarbamol (ROBAXIN) 500 MG tablet Take 1 tablet (500 mg total) by mouth 2 (two) times daily. 10/25/15   Barrett Henle, PA-C  traMADol (ULTRAM) 50 MG tablet Take 1 tablet (50 mg total) by mouth 3 (three) times daily as needed for severe pain. 05/24/16   Tarry Kos, MD  traMADol (ULTRAM) 50 MG tablet Take 1 tablet per day for pain. 06/01/16   Cammy Copa, MD    Family History Family History  Problem Relation Age of Onset  . Hypertension Mother   . Diabetes Mother   . Hypertension Father   . Cancer Father     Social History Social History  Substance Use Topics  . Smoking status: Former Smoker    Packs/day: 3.00    Years: 21.00    Types: Cigarettes    Quit date: 08/18/2002  . Smokeless tobacco: Former Neurosurgeon  . Alcohol use Yes     Comment: RARE     Allergies   Penicillins and Sulfa antibiotics   Review of Systems Review of Systems  All other systems reviewed and are negative.    Physical Exam Updated Vital Signs BP 143/98 (BP Location: Left Arm)   Pulse 93   Temp 97.6 F (36.4 C) (Oral)   Resp 14   Ht 5\' 10"  (1.778 m)   SpO2 96%   Physical Exam  Constitutional: He is oriented to person, place, and time. He appears well-developed and well-nourished.  Non-toxic appearance. No distress.  HENT:  Head: Normocephalic and atraumatic.  Eyes: Conjunctivae, EOM and lids are normal. Pupils are equal, round, and reactive to light.  Neck: Normal range of motion. Neck supple. No tracheal deviation present. No thyroid mass present.  Cardiovascular: Normal rate, regular rhythm and normal heart sounds.  Exam reveals no gallop.   No murmur heard. Pulmonary/Chest: Effort normal and breath sounds normal. No stridor. No respiratory distress. He has no decreased breath sounds. He has no wheezes. He has no rhonchi. He has no rales.  Abdominal: Soft. Normal appearance and bowel sounds are normal. He exhibits no distension. There is no tenderness. There is no rebound and no CVA tenderness.  Musculoskeletal: Normal range of motion. He exhibits no edema or tenderness.       Left  knee: He exhibits no effusion, no ecchymosis, no LCL laxity and no MCL laxity.       Legs: No effusion appreciated. Skin intact. Some PCL laxity noted.  Neurological: He is alert and oriented to person, place, and time. He has normal strength. No cranial nerve deficit or sensory deficit. GCS eye subscore is 4. GCS verbal subscore is 5. GCS motor subscore is 6.  Skin: Skin is warm and dry. No abrasion and no rash noted.  Psychiatric: He has a normal mood and affect. His speech is normal and behavior is normal.  Nursing note and vitals reviewed.    ED Treatments / Results  Labs (all labs ordered are listed, but only abnormal results are displayed) Labs Reviewed - No data to display  EKG  EKG Interpretation None       Radiology Dg Knee Complete 4 Views Left  Result Date: 06/19/2016 CLINICAL DATA:  Left knee pain for 1 day after popping injury getting out of truck. Initial encounter. EXAM: LEFT KNEE - COMPLETE 4+ VIEW COMPARISON:  None. FINDINGS: No evidence of fracture, dislocation, or joint effusion. Mild degenerative medial compartment spurring with borderline joint narrowing. IMPRESSION: No acute finding. Degenerative spurring at the medial compartment. Electronically Signed   By: Marnee SpringJonathon  Watts M.D.   On: 06/19/2016 09:21    Procedures Procedures (including critical care time)  Medications Ordered in ED Medications - No data to display   Initial Impression / Assessment and Plan / ED Course  I have reviewed the triage vital signs and the nursing notes.  Pertinent labs & imaging results that were available during my care of the patient were reviewed by me and considered in my medical decision making (see chart for details).  Clinical Course     X-ray of knee is negative. Will be placed in the sleeve and given orthopedic referral.  Final Clinical Impressions(s) / ED Diagnoses   Final diagnoses:  None    New Prescriptions New Prescriptions   No medications on file      Lorre NickAnthony Silvanna Ohmer, MD 06/19/16 [phone]

## 2016-06-19 NOTE — ED Triage Notes (Signed)
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

## 2016-06-19 NOTE — Discharge Instructions (Signed)
Follow-up with your orthopedist surgeon next week

## 2016-06-24 ENCOUNTER — Telehealth (INDEPENDENT_AMBULATORY_CARE_PROVIDER_SITE_OTHER): Payer: Self-pay | Admitting: Orthopedic Surgery

## 2016-06-24 NOTE — Telephone Encounter (Signed)
Pt requesting refill of tramadol.  [phone]  Thanks!

## 2016-06-24 NOTE — Telephone Encounter (Signed)
Rx refill request, tramadol, last received on 06/01/2016 #30.  Please advise.

## 2016-06-25 MED ORDER — TRAMADOL HCL 50 MG PO TABS: 50.0000 mg | ORAL_TABLET | Freq: Three times a day (TID) | ORAL | 0 refills | Status: DC | PRN

## 2016-06-25 NOTE — Telephone Encounter (Signed)
[facility] pharm and LMVM with refill

## 2016-06-25 NOTE — Telephone Encounter (Signed)
Ok to rrf pls cla lhtx

## 2016-07-14 ENCOUNTER — Ambulatory Visit (INDEPENDENT_AMBULATORY_CARE_PROVIDER_SITE_OTHER): Payer: 59

## 2016-07-14 ENCOUNTER — Ambulatory Visit (INDEPENDENT_AMBULATORY_CARE_PROVIDER_SITE_OTHER): Payer: 59 | Admitting: Sports Medicine

## 2016-07-14 ENCOUNTER — Other Ambulatory Visit: Payer: Self-pay

## 2016-07-14 ENCOUNTER — Encounter: Payer: Self-pay | Admitting: Sports Medicine

## 2016-07-14 VITALS — BP 130/88 | HR 80 | Ht 71.0 in | Wt 286.4 lb

## 2016-07-14 DIAGNOSIS — S60221A Contusion of right hand, initial encounter: Secondary | ICD-10-CM | POA: Diagnosis not present

## 2016-07-14 DIAGNOSIS — W19XXXA Unspecified fall, initial encounter: Secondary | ICD-10-CM

## 2016-07-14 DIAGNOSIS — M79641 Pain in right hand: Secondary | ICD-10-CM

## 2016-07-14 DIAGNOSIS — M67441 Ganglion, right hand: Secondary | ICD-10-CM | POA: Diagnosis not present

## 2016-07-14 MED ORDER — ACETAMINOPHEN-CODEINE #4 300-60 MG PO TABS: 1.0000 | ORAL_TABLET | ORAL | 0 refills | Status: DC | PRN

## 2016-07-14 NOTE — Progress Notes (Addendum)
Mike AntiguaRonald D Watkins - 51 y.o. male MRN [id number]  Date of birth: December 24, 1965  Office Visit Note: Visit Date: 07/14/2016 PCP: Maryelizabeth RowanEWEY,ELIZABETH, MD Referred by: Lewis Moccasinewey, Elizabeth R, MD  Subjective: Chief Complaint  Patient presents with  . Wrist Pain    Pt c/o pain in right wrist. He has a cyst that needs to be removed. He is currently wearing a wrist brace. This morning he slipped on a toy ball and fell and injured wrist when he caught himself.    HPI: Fall today on an outstretched hand.  He has moderate degree of pain over the proximal hand and wrist.  He was wearing her wrist splint when he fell due to chronic ongoing issues with third flexor tendon ganglion cyst that is reportedly going to be excised next month.  Reports decreased grip strength this time following the fall.  Pain is not out of proportion but he is quite tenderOver the palmar aspect.  No significant bruising or swelling at this time.  ROS: No significant numbness or tingling.  Otherwise per HPI.  Objective:  VS:  HT:5\' 11"  (180.3 cm)   WT:286 lb 6.4 oz (129.9 kg)  BMI:40    BP:130/88  HR:80bpm  TEMP: ( )  RESP:96 % Physical Exam: Adult male. Alert and appropriate.  In no acute distress.  Upper extremities are overall well aligned with no significant deformity. No significant swellin  Distal pulses 2+/4. No significant bruising/ecchymosis or erythema the skin Right hand and wrist: Overall well aligned.  He is a large hand.  He is mildly tender over the hyperthenar eminence.  Minimal pain along the lateral aspect of the hand and wrist with no step-off or focal areas of swelling. Imaging: Dg Wrist Complete Right  Result Date: 07/14/2016 CLINICAL DATA:  Fall. EXAM: RIGHT WRIST - COMPLETE 3+ VIEW COMPARISON:  06/20/2011. FINDINGS: No acute bony or joint abnormality identified. No focal bony abnormality identified. IMPRESSION: No acute abnormality . Electronically Signed   By: Maisie Fushomas  Register   On: 07/14/2016 11:43     Assessment & Plan: Visit Diagnoses:    ICD-9-CM ICD-10-CM   1. Contusion of right hand, initial encounter 923.20 S60.221A   2. Hand pain, right 729.5 M79.641   3. Ganglion of flexor tendon sheath of right middle finger 727.42 M67.441   4. Fall, initial encounter [phone] W19.XXXA DG Wrist Complete Right    Ganglion of flexor tendon sheath of right middle finger Patient is to call [facility] regarding scheduling his surgery for excision of his ganglion cyst.  It does seem to be slightly larger than in the past.  He can can can you with wrist immobilization for work to see if this will minimize the use of his hand.  Contusion of right hand Exam and x-rays reassuring.  If any lack of improvement consider CT scan of the hand and this was discussed with the patient today to evaluate for potential hamate fracture although this would be highly unusual especially given the setting of him being in a wrist immobilizer at the time of the fall.  Prescription for Tylenol No. 4 provided for short-term acute pain only.     Procedures:    Follow-up: Return if symptoms worsen or fail to improve.    Past Medical/Family/Surgical/Social History: Medications & Allergies reviewed per EMR Patient Active Problem List   Diagnosis Date Noted  . Contusion of right hand 07/14/2016  . Ganglion of flexor tendon sheath of right middle finger 06/02/2016  . Impingement syndrome  of left shoulder 04/20/2016   Past Medical History:  Diagnosis Date  . Arthritis   . Hypertension   . Penile lesion    Family History  Problem Relation Age of Onset  . Hypertension Mother   . Diabetes Mother   . Hypertension Father   . Cancer Father    Past Surgical History:  Procedure Laterality Date  . ANTERIOR CERVICAL DECOMP/DISCECTOMY FUSION  12-31-2002   C5 -- C7  . PENILE BIOPSY N/A 08/23/2012   Procedure: EXCISIONAL BIOPSY OF PENILE LESION;  Surgeon: Milford Cage, MD;  Location: [facility];  Service: Urology;  Laterality: N/A;  EXCISIONAL BIOPSY OF PENILE LESION    . POSTERIOR FUSION CERVICAL SPINE  02-26-2005   C5 -- C7   Social History   Occupational History  . Not on file.   Social History Main Topics  . Smoking status: Former Smoker    Packs/day: 3.00    Years: 21.00    Types: Cigarettes    Quit date: 08/18/2002  . Smokeless tobacco: Former Neurosurgeon  . Alcohol use Yes     Comment: RARE  . Drug use: No  . Sexual activity: Not on file

## 2016-07-14 NOTE — Patient Instructions (Signed)
Please call [facility] to confirm having Surgery with Dr. August Saucerean for the cyst

## 2016-07-14 NOTE — Assessment & Plan Note (Signed)
Exam and x-rays reassuring.  If any lack of improvement consider CT scan of the hand and this was discussed with the patient today to evaluate for potential hamate fracture although this would be highly unusual especially given the setting of him being in a wrist immobilizer at the time of the fall.  Prescription for Tylenol No. 4 provided for short-term acute pain only.

## 2016-07-14 NOTE — Assessment & Plan Note (Signed)
Patient is to call [facility] regarding scheduling his surgery for excision of his ganglion cyst.  It does seem to be slightly larger than in the past.  He can can can you with wrist immobilization for work to see if this will minimize the use of his hand.

## 2016-07-28 ENCOUNTER — Telehealth (INDEPENDENT_AMBULATORY_CARE_PROVIDER_SITE_OTHER): Payer: Self-pay | Admitting: *Deleted

## 2016-07-28 MED ORDER — TRAMADOL HCL 50 MG PO TABS: 50.0000 mg | ORAL_TABLET | Freq: Two times a day (BID) | ORAL | 0 refills | Status: DC | PRN

## 2016-07-28 NOTE — Telephone Encounter (Signed)
See note, we have not seen him for this.  Ophelia CharterYates last saw him for back/leg pain on 01/16/16.  Please advise?

## 2016-07-28 NOTE — Telephone Encounter (Signed)
Pt called stating he needs something for sciatic nerve pain.

## 2016-07-28 NOTE — Telephone Encounter (Signed)
[facility] pharm and LMVM with Rx, IC patient and advised per Dr Diamantina Providenceean's note.

## 2016-07-28 NOTE — Telephone Encounter (Signed)
Okay for Ultram one by mouth every 12 hours when necessary pain #30 with no refills he will need to make a return office visit to see Dr. Ophelia CharterYates if his pain persists beyond this prescription duration

## 2016-08-09 ENCOUNTER — Other Ambulatory Visit: Payer: Self-pay

## 2016-08-09 ENCOUNTER — Inpatient Hospital Stay (INDEPENDENT_AMBULATORY_CARE_PROVIDER_SITE_OTHER): Payer: 59 | Admitting: Orthopedic Surgery

## 2016-08-09 DIAGNOSIS — M67441 Ganglion, right hand: Secondary | ICD-10-CM | POA: Diagnosis not present

## 2016-08-10 ENCOUNTER — Telehealth (INDEPENDENT_AMBULATORY_CARE_PROVIDER_SITE_OTHER): Payer: Self-pay | Admitting: *Deleted

## 2016-08-10 NOTE — Telephone Encounter (Signed)
Patient called in this afternoon in regards to still being in a lot pain from surgery . He wanted to know if he could possibly have something stronger than Oxycodone? His CB # [phone]. Thank you

## 2016-08-10 NOTE — Telephone Encounter (Signed)
See other msg

## 2016-08-10 NOTE — Telephone Encounter (Signed)
See message from patient- re: Rx

## 2016-08-10 NOTE — Telephone Encounter (Signed)
Pt called stating he needs something stronger for pain.

## 2016-08-10 NOTE — Telephone Encounter (Signed)
What is he on now

## 2016-08-10 NOTE — Telephone Encounter (Signed)
Rx request postop pt

## 2016-08-10 NOTE — Telephone Encounter (Signed)
IC patient and advised-  He understands.

## 2016-08-10 NOTE — Telephone Encounter (Signed)
Oxycodone is a strong as it gets he can add ibuprofen with that is needed please call thanks

## 2016-08-10 NOTE — Telephone Encounter (Signed)
Patient also asks for a sling, per Dr August Saucerean ok to give him a sling. Patient will pickup sling today.

## 2016-08-12 ENCOUNTER — Encounter (INDEPENDENT_AMBULATORY_CARE_PROVIDER_SITE_OTHER): Payer: Self-pay | Admitting: Radiology

## 2016-08-18 ENCOUNTER — Ambulatory Visit (INDEPENDENT_AMBULATORY_CARE_PROVIDER_SITE_OTHER): Payer: 59 | Admitting: Orthopedic Surgery

## 2016-08-18 ENCOUNTER — Encounter (INDEPENDENT_AMBULATORY_CARE_PROVIDER_SITE_OTHER): Payer: Self-pay | Admitting: Orthopedic Surgery

## 2016-08-18 DIAGNOSIS — M67441 Ganglion, right hand: Secondary | ICD-10-CM

## 2016-08-18 MED ORDER — TRAMADOL HCL 50 MG PO TABS: 50.0000 mg | ORAL_TABLET | Freq: Two times a day (BID) | ORAL | 0 refills | Status: DC | PRN

## 2016-08-18 MED ORDER — TRAMADOL HCL 50 MG PO TABS
50.0000 mg | ORAL_TABLET | Freq: Two times a day (BID) | ORAL | 0 refills | Status: DC | PRN
Start: 2016-08-18 — End: 2016-08-18

## 2016-08-18 NOTE — Progress Notes (Signed)
   Post-Op Visit Note   Patient: Mike AntiguaRonald D Watkins           Date of Birth: 03/30/1966           MRN: [id number] Visit Date: 08/18/2016 PCP: Maryelizabeth RowanEWEY,ELIZABETH, MD   Assessment & Plan:  Chief Complaint:  Chief Complaint  Patient presents with  . Right Hand - Routine Post Op   Visit Diagnoses:  1. Ganglion of flexor tendon sheath of right middle finger     Plan: [name] is a 51 year old patient 9 days out ganglion cyst removal from the right hand.  He has been back to work.  On exam motor sensory function to the third finger is intac incision is intact sutures removed Steri-Strips applied on see him back in 3 weeks just for clinical recheck.  On her refills tramadol to take during the day.  Follow-Up Instructions: Return in about 3 weeks (around 09/08/2016).   Orders:  No orders of the defined types were placed in this encounter.  No orders of the defined types were placed in this encounter.   Imaging: No results found.  PMFS History: Patient Active Problem List   Diagnosis Date Noted  . Contusion of right hand 07/14/2016  . Ganglion of flexor tendon sheath of right middle finger 06/02/2016  . Impingement syndrome of left shoulder 04/20/2016   Past Medical History:  Diagnosis Date  . Arthritis   . Hypertension   . Penile lesion     Family History  Problem Relation Age of Onset  . Hypertension Mother   . Diabetes Mother   . Hypertension Father   . Cancer Father     Past Surgical History:  Procedure Laterality Date  . ANTERIOR CERVICAL DECOMP/DISCECTOMY FUSION  12-31-2002   C5 -- C7  . PENILE BIOPSY N/A 08/23/2012   Procedure: EXCISIONAL BIOPSY OF PENILE LESION;  Surgeon: [name], MD;  [facility];  Service: Urology;  Laterality: N/A;  EXCISIONAL BIOPSY OF PENILE LESION    . POSTERIOR FUSION CERVICAL SPINE  02-26-2005   C5 -- C7   Social History   Occupational History  . Not on file.   Social History Main Topics  .  Smoking status: Former Smoker    Packs/day: 3.00    Years: 21.00    Types: Cigarettes    Quit date: 08/18/2002  . Smokeless tobacco: Former NeurosurgeonUser  . Alcohol use Yes     Comment: RARE  . Drug use: No  . Sexual activity: Not on file

## 2016-08-24 ENCOUNTER — Ambulatory Visit (INDEPENDENT_AMBULATORY_CARE_PROVIDER_SITE_OTHER): Payer: 59 | Admitting: Physician Assistant

## 2016-08-24 ENCOUNTER — Ambulatory Visit (INDEPENDENT_AMBULATORY_CARE_PROVIDER_SITE_OTHER): Payer: 59

## 2016-08-24 DIAGNOSIS — M79644 Pain in right finger(s): Secondary | ICD-10-CM

## 2016-08-24 DIAGNOSIS — Z9889 Other specified postprocedural states: Secondary | ICD-10-CM

## 2016-08-24 MED ORDER — TRAMADOL HCL 50 MG PO TABS: 50.0000 mg | ORAL_TABLET | Freq: Two times a day (BID) | ORAL | 0 refills | Status: DC | PRN

## 2016-08-24 NOTE — Progress Notes (Signed)
Office Visit Note   Patient: Mike Watkins           Date of Birth: 31-Aug-1965           MRN: [id number] Visit Date: 08/24/2016              Requested by: [name], MD [address] [place], [place] [address] PCP: [name], MD   Assessment & Plan: Visit Diagnoses:  1. Pain of right middle finger   2. S/P excision of ganglion cyst     Plan: He will work on his range of motion of the right hand particularly the middle finger. Given a refill on tramadol. I'll up with Dr. August Saucer as scheduled  Follow-Up Instructions: Return for has appointment with Dr. August Saucer.   Orders:  Orders Placed This Encounter  Procedures  . XR Finger Middle Right   Meds ordered this encounter  Medications  . traMADol (ULTRAM) 50 MG tablet    Sig: Take 1 tablet (50 mg total) by mouth every 12 (twelve) hours as needed.    Dispense:  30 tablet    Refill:  0      Procedures: No procedures performed   Clinical Data: No additional findings.   Subjective: No chief complaint on file.   HPI Status post ganglion cyst removal right hand was in today due to a fall last night's note states that his oxycodone he was given postop actually went into the snow was unable to retrieve these. He is asking for a refill on the oxycodone. Also reports that he that his right middle finger back is having increased pain and decreased range of motion of the finger.  Review of Systems   Objective: Vital Signs: There were no vitals taken for this visit.  Physical Exam  Ortho Exam Middle finger surgical incision sites healing well no signs of dehiscence or wound infection. He is unable to make a complete fist the right hand but there is no malrotation of the metacarpal heads.full extension right middle finger. He has subjective tenderness throughout the right middle finger from the proximal phalanx to the distal phalanx. No significant edema of the right middle finger. There is no ecchymosis  erythema or impending ulcers or rashes of the middle finger Specialty Comments:  No specialty comments available.  Imaging: Xr Finger Middle Right  Result Date: 08/24/2016 3 views right middle finger: No acute fracture or dislocation. No bony abnormalities.    PMFS History: Patient Active Problem List   Diagnosis Date Noted  . Contusion of right hand 07/14/2016  . Ganglion of flexor tendon sheath of right middle finger 06/02/2016  . Impingement syndrome of left shoulder 04/20/2016   Past Medical History:  Diagnosis Date  . Arthritis   . Hypertension   . Penile lesion     Family History  Problem Relation Age of Onset  . Hypertension Mother   . Diabetes Mother   . Hypertension Father   . Cancer Father     Past Surgical History:  Procedure Laterality Date  . ANTERIOR CERVICAL DECOMP/DISCECTOMY FUSION  12-31-2002   C5 -- C7  . PENILE BIOPSY N/A 08/23/2012   Procedure: EXCISIONAL BIOPSY OF PENILE LESION;  Surgeon: Milford Cage, MD;  Location: [facility];  Service: Urology;  Laterality: N/A;  EXCISIONAL BIOPSY OF PENILE LESION    . POSTERIOR FUSION CERVICAL SPINE  02-26-2005   C5 -- C7   Social History   Occupational History  .  Not on file.   Social History Main Topics  . Smoking status: Former Smoker    Packs/day: 3.00    Years: 21.00    Types: Cigarettes    Quit date: 08/18/2002  . Smokeless tobacco: Former NeurosurgeonUser  . Alcohol use Yes     Comment: RARE  . Drug use: No  . Sexual activity: Not on file

## 2016-08-25 ENCOUNTER — Ambulatory Visit (INDEPENDENT_AMBULATORY_CARE_PROVIDER_SITE_OTHER): Payer: 59 | Admitting: Sports Medicine

## 2016-08-25 ENCOUNTER — Encounter: Payer: Self-pay | Admitting: Sports Medicine

## 2016-08-25 VITALS — BP 140/100 | HR 91 | Ht 71.0 in | Wt 279.2 lb

## 2016-08-25 DIAGNOSIS — M25561 Pain in right knee: Secondary | ICD-10-CM

## 2016-08-25 DIAGNOSIS — W19XXXA Unspecified fall, initial encounter: Secondary | ICD-10-CM

## 2016-08-25 DIAGNOSIS — S60221A Contusion of right hand, initial encounter: Secondary | ICD-10-CM

## 2016-08-25 MED ORDER — TRAMADOL HCL 50 MG PO TABS: 50.0000 mg | ORAL_TABLET | Freq: Two times a day (BID) | ORAL | 0 refills | Status: DC | PRN

## 2016-08-25 MED ORDER — TRAMADOL HCL 50 MG PO TABS: 50.0000 mg | ORAL_TABLET | Freq: Three times a day (TID) | ORAL | 0 refills | Status: DC

## 2016-08-25 MED ORDER — DICLOFENAC SODIUM 2 % TD SOLN: 1.0000 "application " | Freq: Two times a day (BID) | TRANSDERMAL | 2 refills | Status: DC

## 2016-08-25 NOTE — Progress Notes (Signed)
COURTEZ TWADDLE - 51 y.o. male MRN [id number]  Date of birth: August 10, 1965  Office Visit Note: Visit Date: 08/25/2016 PCP: [name], MD Referred by: [name], MD  Subjective: Chief Complaint  Patient presents with  . RT knee injury 08/23/2016    Pt slipped on ice/snow Monday evening walking up his steps at home. He c/o Medial L knee pain. Walking and going from sitting to standing position triggers the pain and feels like its "catching." He was prescribed Tramadol yesterday from [facility] but has not started medication.  Denies any swelling or brusing.    HPI: Patient reports an acute work in of right knee injury.  He has recently had surgery on his right hand which has been pending and was seen at [facility] orthopedics for this yesterday as a work in due to concern for recurrent injury however this does seem to be improving at this time.  He is having persistent right-sided knee pain.  He was unable to fill the tramadol previously prescribed due to pain to early due to the every 12 hours nature. ROS: Otherwise per HPI.  Objective:  VS:  HT:5\' 11"  (180.3 cm)   WT:279 lb 3.2 oz (126.6 kg)  BMI:39    BP:(!) 140/100  HR:91bpm  TEMP: ( )  RESP:97 % Physical Exam: GENERAL:  WDWN, NAD, Non-toxic appearing PSYCH:  Alert & appropriately interactive  Not depressed or anxious appearing Right hand: Postsurgical incision appears normal.  He has good function of his hand at this time.  Right knee: Overall well aligned.  No significant deformity.  He has only minimal effusion.  He is stable to varus and valgus strain although has pain with McMurray's.  No mechanical clicking.  He is able to perform a Thessaly but has some pain.  He has 3-4 mm of opening with valgus stressing but once again solid endpoint.  Normal anterior and posterior drawer.  Pain is most focally located over the anterior lateral knee.  There is a small superficial abrasion.  Imaging &  Procedures: Xr Finger Middle Right  Result Date: 08/24/2016 3 views right middle finger: No acute fracture or dislocation. No bony abnormalities.    Assessment & Plan: Problem List Items Addressed This Visit    Acute pain of right knee    Patient does have a contusion of his right knee.  This time we discussed multiple options he would like to defer any type of injection therapy.  Previous x-rays obtained at [facility] orthopedics of the right knee reviewed with the patient today.  Given his weightbearing no significant effusion will defer further imaging at this time.  We had a long discussion today   Regarding appropriate use of narcotic medications and this is not an indication at this time for narcotics.  We will however give him a new prescription for the tramadol given he was unable to fill the last one to 2 it being written for every 12 hours.  This will need to be weaned down we will have him follow-up with Dr. August Saucer to discuss this further regarding his overall pain management from his postsurgical setting.  Prescription today for Pennsaid provided for the right knee and sample given.      Contusion of right hand - Primary    Postsurgical incision is well-healed.  Flexor tendons are intact.  No changes at this time.  Keep follow-up with Dr. August Saucer.       Fall -multiple falls in the past year.  Patient is a high fall risk as he has had multiple falls over the past 1 year.  No significant trauma apparent today.         Follow-up: Return if symptoms worsen or fail to improve.   Past Medical/Family/Surgical/Social History: Medications & Allergies reviewed per EMR Patient Active Problem List   Diagnosis Date Noted  . Fall -multiple falls in the past year. 08/29/2016  . Acute pain of right knee 08/29/2016  . Contusion of right hand 07/14/2016  . Ganglion of flexor tendon sheath of right middle finger 06/02/2016  . Impingement syndrome of left shoulder 04/20/2016   Past Medical  History:  Diagnosis Date  . Arthritis   . Hypertension   . Penile lesion    Family History  Problem Relation Age of Onset  . Hypertension Mother   . Diabetes Mother   . Hypertension Father   . Cancer Father    Past Surgical History:  Procedure Laterality Date  . ANTERIOR CERVICAL DECOMP/DISCECTOMY FUSION  12-31-2002   C5 -- C7  . PENILE BIOPSY N/A 08/23/2012   Procedure: EXCISIONAL BIOPSY OF PENILE LESION;  Surgeon: [name], MD;  [facility];  Service: Urology;  Laterality: N/A;  EXCISIONAL BIOPSY OF PENILE LESION    . POSTERIOR FUSION CERVICAL SPINE  02-26-2005   C5 -- C7   Social History   Occupational History  . Not on file.   Social History Main Topics  . Smoking status: Former Smoker    Packs/day: 3.00    Years: 21.00    Types: Cigarettes    Quit date: 08/18/2002  . Smokeless tobacco: Former NeurosurgeonUser  . Alcohol use Yes     Comment: RARE  . Drug use: No  . Sexual activity: Not on file

## 2016-08-29 DIAGNOSIS — W19XXXA Unspecified fall, initial encounter: Secondary | ICD-10-CM | POA: Insufficient documentation

## 2016-08-29 DIAGNOSIS — M25561 Pain in right knee: Secondary | ICD-10-CM | POA: Insufficient documentation

## 2016-08-29 NOTE — Assessment & Plan Note (Signed)
Postsurgical incision is well-healed.  Flexor tendons are intact.  No changes at this time.  Keep follow-up with Dr. August Saucerean.

## 2016-08-29 NOTE — Assessment & Plan Note (Signed)
Patient is a high fall risk as he has had multiple falls over the past 1 year.  No significant trauma apparent today.

## 2016-08-29 NOTE — Assessment & Plan Note (Signed)
Patient does have a contusion of his right knee.  This time we discussed multiple options he would like to defer any type of injection therapy.  [facility] of the right knee reviewed with the patient today.  Given his weightbearing no significant effusion will defer further imaging at this time.  We had a long discussion today   Regarding appropriate use of narcotic medications and this is not an indication at this time for narcotics.  We will however give him a new prescription for the tramadol given he was unable to fill the last one to 2 it being written for every 12 hours.  This will need to be weaned down we will have him follow-up with Dr. August Saucerean to discuss this further regarding his overall pain management from his postsurgical setting.  Prescription today for Pennsaid provided for the right knee and sample given.

## 2016-09-04 ENCOUNTER — Encounter (HOSPITAL_COMMUNITY): Payer: Self-pay | Admitting: Emergency Medicine

## 2016-09-04 ENCOUNTER — Emergency Department (HOSPITAL_COMMUNITY)
Admission: EM | Admit: 2016-09-04 | Discharge: 2016-09-04 | Disposition: A | Discharge status: Home / Self Care | Payer: 59 | Attending: Emergency Medicine | Admitting: Emergency Medicine

## 2016-09-04 DIAGNOSIS — Z87891 Personal history of nicotine dependence: Secondary | ICD-10-CM | POA: Insufficient documentation

## 2016-09-04 DIAGNOSIS — G8918 Other acute postprocedural pain: Secondary | ICD-10-CM | POA: Insufficient documentation

## 2016-09-04 DIAGNOSIS — M79644 Pain in right finger(s): Secondary | ICD-10-CM

## 2016-09-04 DIAGNOSIS — I10 Essential (primary) hypertension: Secondary | ICD-10-CM | POA: Insufficient documentation

## 2016-09-04 DIAGNOSIS — Z79899 Other long term (current) drug therapy: Secondary | ICD-10-CM | POA: Insufficient documentation

## 2016-09-04 NOTE — ED Provider Notes (Signed)
[facility]-EMERGENCY DEPT Provider Note   CSN: [id number] Arrival date & time: 09/04/16  1034   By signing my name below, I, Soijett Blue, attest that this documentation has been prepared under the direction and in the presence of Lorretta Harp, PA-C Electronically Signed: Soijett Blue, ED Scribe. 09/04/16. 11:01 AM.  History   Chief Complaint Chief Complaint  Patient presents with  . Hand Pain    HPI Mike Watkins is a 51 y.o. male with a PMHx of HTN, who presents to the Emergency Department complaining of post op right hand pain onset 1 month worsening last night. Pt has tried Rx tramadol, his sister's Rx tramadol, and ibuprofen, with no relief of his symptoms. He states that he had surgery completed by Dr. August Saucer to the base of his right middle finger to excise a ganglion on 08/09/2016. He notes that he lifted heavy boxes yesterday and believes that exacerbated his right hand pain. Pt states that he attempted to call Dr. August Saucer for pain control management on yesterday, but was unable to get into contact with the nurses. Pt reports that he has a follow up appointment with his orthopedist on 09/08/2016. Pt states that he is unable to take naprosyn due to red spots appearing on his skin. He denies nausea, vomiting, numbness, tingling, recent injury, and any other symptoms.    Per [place] Controlled Substance Reporting System, pt was prescribed 30 tablets of tramadol on 08/31/2016.   The history is provided by the patient. No language interpreter was used.    Past Medical History:  Diagnosis Date  . Arthritis   . Hypertension   . Penile lesion     Patient Active Problem List   Diagnosis Date Noted  . Fall -multiple falls in the past year. 08/29/2016  . Acute pain of right knee 08/29/2016  . Contusion of right hand 07/14/2016  . Ganglion of flexor tendon sheath of right middle finger 06/02/2016  . Impingement syndrome of left shoulder 04/20/2016    Past Surgical History:  Procedure Laterality  Date  . ANTERIOR CERVICAL DECOMP/DISCECTOMY FUSION  12-31-2002   C5 -- C7  . CYST EXCISION     pain in r/hand-middle finger  . HAND SURGERY    . PENILE BIOPSY N/A 08/23/2012   Procedure: EXCISIONAL BIOPSY OF PENILE LESION;  Surgeon: Milford Cage, MD;  Location[facility];  Service: Urology;  Laterality: N/A;  EXCISIONAL BIOPSY OF PENILE LESION    . POSTERIOR FUSION CERVICAL SPINE  02-26-2005   C5 -- C7       Home Medications    Prior to Admission medications   Medication Sig Start Date End Date Taking? Authorizing Provider  acetaminophen-codeine (TYLENOL #4) 300-60 MG tablet Take 1 tablet by mouth every 4 (four) hours as needed for moderate pain. Patient not taking: Reported on 08/25/2016 07/14/16   [name], DO  Diclofenac Sodium (PENNSAID) 2 % SOLN Place 1 application onto the skin 2 (two) times daily. 08/25/16   [name], DO  losartan-hydrochlorothiazide (HYZAAR) 50-12.5 MG tablet Take 1 tablet by mouth daily.  06/16/16   Historical Provider, MD  traMADol (ULTRAM) 50 MG tablet Take 1 tablet (50 mg total) by mouth every 8 (eight) hours. 08/25/16   [name], DO    Family History Family History  Problem Relation Age of Onset  . Hypertension Mother   . Diabetes Mother   . Hypertension Father   . Cancer Father     Social  History Social History  Substance Use Topics  . Smoking status: Former Smoker    Packs/day: 3.00    Years: 21.00    Types: Cigarettes    Quit date: 08/18/2002  . Smokeless tobacco: Former Neurosurgeon  . Alcohol use Yes     Comment: RARE     Allergies   Penicillins and Sulfa antibiotics   Review of Systems Review of Systems  Gastrointestinal: Negative for nausea and vomiting.  Musculoskeletal: Positive for arthralgias (right hand). Negative for joint swelling.  Neurological: Negative for numbness.       No tingling     Physical Exam Updated Vital Signs BP (!) 145/80 (BP Location: Right Arm)   Pulse (!) 101    Temp 97.7 F (36.5 C) (Oral)   Resp 20   Wt 122.5 kg   SpO2 96%   BMI 37.66 kg/m   Physical Exam  Constitutional: He appears well-developed and well-nourished.  Well appearing  HENT:  Head: Normocephalic and atraumatic.  Nose: Nose normal.  Eyes: Conjunctivae and EOM are normal.  Neck: Normal range of motion.  Cardiovascular: Normal rate and intact distal pulses.   Distal pulses intact to BUE.   Pulmonary/Chest: Effort normal. No respiratory distress.  Normal work of breathing. No respiratory distress noted.   Abdominal: Soft.  Musculoskeletal: Normal range of motion.       Right hand: He exhibits normal range of motion. Normal sensation noted. Normal strength noted.  FROM of right hand and fingers. Sensation intact distal to area affected. Muscle strength 5/5 against resistance to flexion and extension of affected finger.   Neurological: He is alert.  Skin: Skin is warm. Capillary refill takes less than 2 seconds.  No redness, bruising to the area. Minimal swelling to right 3rd finger. No tenderness to palpation.   Psychiatric: He has a normal mood and affect. His behavior is normal.  Nursing note and vitals reviewed.    ED Treatments / Results  DIAGNOSTIC STUDIES: Oxygen Saturation is 96% on RA, nl by my interpretation.    COORDINATION OF CARE: 10:57 AM Discussed treatment plan with pt at bedside and pt agreed to plan.  Procedures Procedures (including critical care time)  Medications Ordered in ED Medications - No data to display   Initial Impression / Assessment and Plan / ED Course  I have reviewed the triage vital signs and the nursing notes.  Pt here with finger pain s/p surgery x 1 month ago. He has been prescribed tramadol by his orthopedic for this pain regularly and last dispensed on 3/20, which he initially denied having any tramadol left and then when talking to him about what was shown on the [place] data base he admitted to being dispensed  tramadol 4 days ago but states he didn't get the 30 he was supposed to get.  Pt advised to go to his scheduled appointment in 4 days with his orthopedic. Pt then stated that naproxen doesn't work for him because he gets a rash. Conservative therapy recommended like the motrin he stated he has been taking and discussed. Patient in NAD, hemodynamically stable, afebrile. Patient will be dc home & is agreeable with above plan. Return precautions given. I have reviewed the [place] Controlled Substance Reporting System.   Final Clinical Impressions(s) / ED Diagnoses   Final diagnoses:  Finger pain, right    New Prescriptions New Prescriptions   No medications on file   I personally performed the services described in this documentation, which was  scribed in my presence. The recorded information has been reviewed and is accurate.    [address]Al[address] Doyle [place], [place] 09/04/16 [address]    Josemaria Brining [place], [place] 09/04/16 1114    Derwood KaplanAnkit Nanavati, MD 09/05/16 [phone]

## 2016-09-04 NOTE — Discharge Instructions (Signed)
Please continue icing and taking Motrin with food at home. Go to your scheduled appointment in 4 days with your orthopedic.   Get help right away if: Your hand becomes warm, red, or swollen. Your hand is numb or tingling. Your hand is extremely swollen or deformed. Your hand or fingers turn white or blue. You cannot move your hand, wrist, or fingers.

## 2016-09-04 NOTE — ED Triage Notes (Signed)
Pt is one month post op , removal of cyst from base of 3rd finger of r/hand. Pt reports swelling and throbbing over last 24 hour, after lifting a heavy box.  Pt is scheduled for follow up appointment with hand surgeon.in 4 days. Tx with motrin, ice and Tramadol over last 24 hours

## 2016-09-08 ENCOUNTER — Ambulatory Visit (INDEPENDENT_AMBULATORY_CARE_PROVIDER_SITE_OTHER): Payer: 59 | Admitting: Orthopedic Surgery

## 2016-09-08 ENCOUNTER — Encounter (INDEPENDENT_AMBULATORY_CARE_PROVIDER_SITE_OTHER): Payer: Self-pay | Admitting: Orthopedic Surgery

## 2016-09-08 DIAGNOSIS — M67441 Ganglion, right hand: Secondary | ICD-10-CM

## 2016-09-08 MED ORDER — TRAMADOL HCL 50 MG PO TABS: 50.0000 mg | ORAL_TABLET | Freq: Two times a day (BID) | ORAL | 1 refills | Status: DC | PRN

## 2016-09-08 NOTE — Progress Notes (Signed)
   Post-Op Visit Note   Patient: Mike Watkins           Date of Birth: May 16, 1966           MRN: [id number] Visit Date: 09/08/2016 PCP: Maryelizabeth RowanEWEY,ELIZABETH, MD   Assessment & Plan:  Chief Complaint:  Chief Complaint  Patient presents with  . Right Middle Finger - Routine Post Op   Visit Diagnoses:  1. Ganglion of flexor tendon sheath of right middle finger     Plan: Mike Watkins is a 51 year old patient is now a month out right middle finger cyst excision.  Generally doing well but still having little bit of pain right at the base of the finger.  On exam he has good range of motion little bit of pain with full extension at deep flexor and superficial flexor tendons intact.  No paresthesias in the hand.  Plan at this time is to refill his tramadol is taking about 1 twice a day.  He'll taper off that in a few weeks to a few months potentially.  I'll see him back as needed  Follow-Up Instructions: Return if symptoms worsen or fail to improve.   Orders:  No orders of the defined types were placed in this encounter.  No orders of the defined types were placed in this encounter.   Imaging: No results found.  PMFS History: Patient Active Problem List   Diagnosis Date Noted  . Fall -multiple falls in the past year. 08/29/2016  . Acute pain of right knee 08/29/2016  . Contusion of right hand 07/14/2016  . Ganglion of flexor tendon sheath of right middle finger 06/02/2016  . Impingement syndrome of left shoulder 04/20/2016   Past Medical History:  Diagnosis Date  . Arthritis   . Hypertension   . Penile lesion     Family History  Problem Relation Age of Onset  . Hypertension Mother   . Diabetes Mother   . Hypertension Father   . Cancer Father     Past Surgical History:  Procedure Laterality Date  . ANTERIOR CERVICAL DECOMP/DISCECTOMY FUSION  12-31-2002   C5 -- C7  . CYST EXCISION     pain in r/hand-middle finger  . HAND SURGERY    . PENILE BIOPSY N/A 08/23/2012   Procedure:  EXCISIONAL BIOPSY OF PENILE LESION;  Surgeon: Mike Cageaniel Young Woodruff, MD;  [facility];  Service: Urology;  Laterality: N/A;  EXCISIONAL BIOPSY OF PENILE LESION    . POSTERIOR FUSION CERVICAL SPINE  02-26-2005   C5 -- C7   Social History   Occupational History  . Not on file.   Social History Main Topics  . Smoking status: Former Smoker    Packs/day: 3.00    Years: 21.00    Types: Cigarettes    Quit date: 08/18/2002  . Smokeless tobacco: Former NeurosurgeonUser  . Alcohol use Yes     Comment: RARE  . Drug use: No  . Sexual activity: Not on file

## 2016-09-26 ENCOUNTER — Encounter (HOSPITAL_COMMUNITY): Payer: Self-pay | Admitting: Emergency Medicine

## 2016-09-26 ENCOUNTER — Ambulatory Visit (HOSPITAL_COMMUNITY)
Admission: EM | Admit: 2016-09-26 | Discharge: 2016-09-26 | Disposition: A | Discharge status: Home / Self Care | Payer: 59 | Attending: Family Medicine | Admitting: Family Medicine

## 2016-09-26 DIAGNOSIS — R062 Wheezing: Secondary | ICD-10-CM

## 2016-09-26 DIAGNOSIS — J441 Chronic obstructive pulmonary disease with (acute) exacerbation: Secondary | ICD-10-CM

## 2016-09-26 DIAGNOSIS — R05 Cough: Secondary | ICD-10-CM

## 2016-09-26 DIAGNOSIS — J449 Chronic obstructive pulmonary disease, unspecified: Secondary | ICD-10-CM

## 2016-09-26 MED ORDER — PREDNISONE 20 MG PO TABS: 20.0000 mg | ORAL_TABLET | Freq: Every day | ORAL | 0 refills | Status: AC

## 2016-09-26 MED ORDER — IPRATROPIUM-ALBUTEROL 0.5-2.5 (3) MG/3ML IN SOLN
RESPIRATORY_TRACT | Status: AC
  Filled 2016-09-26: qty 3

## 2016-09-26 MED ORDER — DOXYCYCLINE HYCLATE 100 MG PO CAPS: 100.0000 mg | ORAL_CAPSULE | Freq: Two times a day (BID) | ORAL | 0 refills | Status: AC

## 2016-09-26 MED ORDER — IPRATROPIUM-ALBUTEROL 0.5-2.5 (3) MG/3ML IN SOLN
3.0000 mL | Freq: Once | RESPIRATORY_TRACT | Status: AC
  Administered 2016-09-26: 3 mL via RESPIRATORY_TRACT

## 2016-09-26 MED ORDER — ALBUTEROL SULFATE HFA 108 (90 BASE) MCG/ACT IN AERS: 1.0000 | INHALATION_SPRAY | Freq: Four times a day (QID) | RESPIRATORY_TRACT | 0 refills | Status: DC | PRN

## 2016-09-26 NOTE — Discharge Instructions (Signed)
It was nice seeing you today. Given that you are wheezing with your cough and hx of smoking, I will treat you for COPD. Please see your PCP soon if no improvement for lung function test.

## 2016-09-26 NOTE — ED Provider Notes (Addendum)
[facility]    CSN: [id number] Arrival date & time: 09/26/16  1606     History   Chief Complaint Chief Complaint  Patient presents with  . Cough    HPI Mike Watkins is a 51 y.o. male.   The history is provided by the patient. No language interpreter was used.  Cough  Cough characteristics:  Productive and non-productive Severity:  Severe Onset quality:  Gradual Duration:  3 days Timing:  Constant Progression:  Worsening Chronicity:  New Smoker: no (Quit in 2004. He smoked for 25 yrs)   Context: sick contacts   Context comment:  One of his friend had Pneumonia Relieved by:  Cough suppressants and decongestant Worsened by:  Nothing Ineffective treatments:  Decongestant and cough suppressants Associated symptoms: chest pain, fever, sinus congestion and wheezing   Associated symptoms: no shortness of breath     Past Medical History:  Diagnosis Date  . Arthritis   . Hypertension   . Penile lesion     Patient Active Problem List   Diagnosis Date Noted  . Fall -multiple falls in the past year. 08/29/2016  . Acute pain of right knee 08/29/2016  . Contusion of right hand 07/14/2016  . Ganglion of flexor tendon sheath of right middle finger 06/02/2016  . Impingement syndrome of left shoulder 04/20/2016    Past Surgical History:  Procedure Laterality Date  . ANTERIOR CERVICAL DECOMP/DISCECTOMY FUSION  12-31-2002   C5 -- C7  . CYST EXCISION     pain in r/hand-middle finger  . HAND SURGERY    . PENILE BIOPSY N/A 08/23/2012   Procedure: EXCISIONAL BIOPSY OF PENILE LESION;  Surgeon: Milford Cage, MD;  Location: [facility];  Service: Urology;  Laterality: N/A;  EXCISIONAL BIOPSY OF PENILE LESION    . POSTERIOR FUSION CERVICAL SPINE  02-26-2005   C5 -- C7       Home Medications    Prior to Admission medications   Medication Sig Start Date End Date Taking? Authorizing Provider  acetaminophen-codeine (TYLENOL #4) 300-60 MG  tablet Take 1 tablet by mouth every 4 (four) hours as needed for moderate pain. 07/14/16   [name], DO  Diclofenac Sodium (PENNSAID) 2 % SOLN Place 1 application onto the skin 2 (two) times daily. 08/25/16   [name], DO  losartan-hydrochlorothiazide (HYZAAR) 50-12.5 MG tablet Take 1 tablet by mouth daily.  06/16/16   Historical Provider, MD  traMADol (ULTRAM) 50 MG tablet Take 1 tablet (50 mg total) by mouth every 8 (eight) hours. Patient not taking: Reported on 09/08/2016 08/25/16   [name], DO  traMADol (ULTRAM) 50 MG tablet Take 1 tablet (50 mg total) by mouth every 12 (twelve) hours as needed. 09/08/16   Cammy Copa, MD    Family History Family History  Problem Relation Age of Onset  . Hypertension Mother   . Diabetes Mother   . Hypertension Father   . Cancer Father     Social History Social History  Substance Use Topics  . Smoking status: Former Smoker    Packs/day: 3.00    Years: 21.00    Types: Cigarettes    Quit date: 08/18/2002  . Smokeless tobacco: Former Neurosurgeon  . Alcohol use Yes     Comment: RARE     Allergies   Penicillins and Sulfa antibiotics   Review of Systems Review of Systems  Constitutional: Positive for fever.  Respiratory: Positive for cough and wheezing. Negative for  shortness of breath.   Cardiovascular: Positive for chest pain.     Physical Exam Triage Vital Signs ED Triage Vitals  Enc Vitals Group     BP 09/26/16 1657 136/68     Pulse Rate 09/26/16 1657 88     Resp 09/26/16 1657 (!) 22     Temp 09/26/16 1657 98.5 F (36.9 C)     Temp Source 09/26/16 1657 Oral     SpO2 09/26/16 1657 96 %     Weight --      Height --      Head Circumference --      Peak Flow --      Pain Score 09/26/16 1655 8     Pain Loc --      Pain Edu? --      Excl. in GC? --    No data found.   Updated Vital Signs BP 136/68 (BP Location: Right Arm) Comment (BP Location): large cuff  Pulse 88   Temp 98.5 F (36.9 C) (Oral)   Resp  (!) 22   SpO2 96%   Visual Acuity Right Eye Distance:   Left Eye Distance:   Bilateral Distance:    Right Eye Near:   Left Eye Near:    Bilateral Near:     Physical Exam  Constitutional: He appears well-developed. No distress.  Cardiovascular: Normal rate and regular rhythm.   No murmur heard. Pulmonary/Chest: Effort normal. No respiratory distress. He has wheezes.      Nursing note and vitals reviewed.    UC Treatments / Results  Labs (all labs ordered are listed, but only abnormal results are displayed) Labs Reviewed - No data to display  EKG  EKG Interpretation None       Radiology No results found.  Procedures Procedures (including critical care time)  Medications Ordered in UC Medications - No data to display   Initial Impression / Assessment and Plan / UC Course  I have reviewed the triage vital signs and the nursing notes.  Pertinent labs & imaging results that were available during my care of the patient were reviewed by me and considered in my medical decision making (see chart for details).  Clinical Course as of Sep 26 1805  Sun Sep 26, 2016  1804 Cough with Wheezing in a patient with hx of tobacco use. Quit in 2004. Likely COPD element. I will treat as mild COPD exacerbation. Duoneb treatment given today. Pulmo exam improved after. O2 Sat on RA normal. Doxycycline given. Albuterol prescribed. F/U as needed. As discussed with him he will need official PFT done as outpatient procedure.  [KE]    Clinical Course User Index [KE] Doreene Eland, MD      Final Clinical Impressions(s) / UC Diagnoses   Final diagnoses:  None   COPD, mild (HCC)  COPD exacerbation (HCC)   New Prescriptions New Prescriptions   No medications on file     Doreene Eland, MD 09/26/16 1809    Doreene Eland, MD 09/26/16 1610    Doreene Eland, MD 09/26/16 9604

## 2016-09-26 NOTE — ED Triage Notes (Signed)
Cough started Tuesday or Wednesday.  Patient has been around a friend that has been sick with pneumonia.  Patient has a cough, chills, unknown degree of temp.  Patient has had a runny nose, aching and wheezing.

## 2016-10-14 ENCOUNTER — Telehealth: Payer: Self-pay | Admitting: Sports Medicine

## 2016-10-14 NOTE — Telephone Encounter (Signed)
Last OV 08-25-2016 Printed script #30  Last OV with [facility] Dr. August Saucerean 09/08/2016 Printed script for #60, 1rf   Please advise on who is scribing this medication, Thanks.

## 2016-10-14 NOTE — Telephone Encounter (Signed)
Patient called in requesting RX refill on   traMADol (ULTRAM) 50 MG tablet  [facility] - [place], [place] - [address] [phone] (Phone) [phone] (Fax)   Please call patient and advise when RX is placed.

## 2016-10-14 NOTE — Telephone Encounter (Signed)
Declined.  I will not be continuing this Rx for him.  We last discussed having him wean off of it and if not needs to discuss this with his surgeons

## 2016-10-15 NOTE — Telephone Encounter (Signed)
Pt is aware of annotations below. 

## 2016-10-21 ENCOUNTER — Encounter (INDEPENDENT_AMBULATORY_CARE_PROVIDER_SITE_OTHER): Payer: Self-pay | Admitting: Family

## 2016-10-21 ENCOUNTER — Ambulatory Visit (INDEPENDENT_AMBULATORY_CARE_PROVIDER_SITE_OTHER): Payer: Self-pay

## 2016-10-21 ENCOUNTER — Ambulatory Visit (INDEPENDENT_AMBULATORY_CARE_PROVIDER_SITE_OTHER): Payer: Self-pay | Admitting: Family

## 2016-10-21 VITALS — Ht 71.0 in | Wt 270.0 lb

## 2016-10-21 DIAGNOSIS — M5441 Lumbago with sciatica, right side: Secondary | ICD-10-CM

## 2016-10-21 MED ORDER — CYCLOBENZAPRINE HCL 10 MG PO TABS: 10.0000 mg | ORAL_TABLET | Freq: Three times a day (TID) | ORAL | 0 refills | Status: DC | PRN

## 2016-10-21 MED ORDER — PREDNISONE 10 MG PO TABS: ORAL_TABLET | ORAL | 0 refills | Status: DC

## 2016-10-21 MED ORDER — ACETAMINOPHEN-CODEINE #4 300-60 MG PO TABS: 1.0000 | ORAL_TABLET | ORAL | 0 refills | Status: DC | PRN

## 2016-10-21 NOTE — Progress Notes (Signed)
Office Visit Note   Patient: Mike Watkins           Date of Birth: November 16, 1965           MRN: [id number] Visit Date: 10/21/2016              Requested by: [name], MD [address] [place], [place] [address] PCP: [name], MD  Chief Complaint  Patient presents with  . Lower Back - Pain      HPI: The patient is a 51 year old gentleman who presents today complaining of right sided low back pain. Complains of some associated numbness and tingling in the right foot. Since states he has to sit with his legs stretched out and wean off to the side to relieve pressure on the right for comfort. States that he was stepping out of his truck and his foot got caught in "tweaked his back. Reports a history of sciatica. States has some Tylenol #4 that he has been taking with moderate relief of symptoms. This helps him sleep.   Pain worsened by ambulation and driving.   No loss of bowel or bladder. No associated weakness.   Assessment & Plan: Visit Diagnoses:  1. Acute right-sided low back pain with right-sided sciatica     Plan: Will provide a medrol dose pack, muscle relaxer as well as a one time prescription for Tylenol #4. Follow up in 4 weeks if continued pain or symptoms.   Follow-Up Instructions: No Follow-up on file.   Ortho Exam  Patient is alert, oriented, no adenopathy, well-dressed, normal affect, normal respiratory effort. No spinous process tenderness. Does have SI tenderness on right as well as right sided lumbar soft tissue pain. Positive straight leg on left. Negative on right. Strength equal bilaterally.   Imaging: No results found.  Labs: Lab Results  Component Value Date   REPTSTATUS 08/06/2007 FINAL 08/05/2007   CULT NO GROWTH 08/05/2007    Orders:  Orders Placed This Encounter  Procedures  . XR Lumbar Spine 2-3 Views   Meds ordered this encounter  Medications  . acetaminophen-codeine (TYLENOL #4) 300-60 MG tablet    Sig: Take  1 tablet by mouth every 4 (four) hours as needed for moderate pain.    Dispense:  30 tablet    Refill:  0  . predniSONE (DELTASONE) 10 MG tablet    Sig: 6 tablets for 2 days, then 5 for 2 days, then 4 for 2 days, then 3  for 2 days, then 2 for 2 days, then 1 tablet for 2 days    Dispense:  48 tablet    Refill:  0  . cyclobenzaprine (FLEXERIL) 10 MG tablet    Sig: Take 1 tablet (10 mg total) by mouth 3 (three) times daily as needed for muscle spasms.    Dispense:  30 tablet    Refill:  0     Procedures: No procedures performed  Clinical Data: No additional findings.  ROS:  All other systems negative, except as noted in the HPI. Review of Systems  Constitutional: Negative for chills and fever.  Musculoskeletal: Positive for back pain.  Neurological: Positive for numbness. Negative for weakness.    Objective: Vital Signs: Ht 5\' 11"  (1.803 m)   Wt 270 lb (122.5 kg)   BMI 37.66 kg/m   Specialty Comments:  No specialty comments available.  PMFS History: Patient Active Problem List   Diagnosis Date Noted  . Fall -multiple falls in the  past year. 08/29/2016  . Acute pain of right knee 08/29/2016  . Contusion of right hand 07/14/2016  . Ganglion of flexor tendon sheath of right middle finger 06/02/2016  . Impingement syndrome of left shoulder 04/20/2016   Past Medical History:  Diagnosis Date  . Arthritis   . Hypertension   . Penile lesion     Family History  Problem Relation Age of Onset  . Hypertension Mother   . Diabetes Mother   . Hypertension Father   . Cancer Father     Past Surgical History:  Procedure Laterality Date  . ANTERIOR CERVICAL DECOMP/DISCECTOMY FUSION  12-31-2002   C5 -- C7  . CYST EXCISION     pain in r/hand-middle finger  . HAND SURGERY    . PENILE BIOPSY N/A 08/23/2012   Procedure: EXCISIONAL BIOPSY OF PENILE LESION;  Surgeon: [name], MD;  [facility];  Service: Urology;  Laterality: N/A;   EXCISIONAL BIOPSY OF PENILE LESION    . POSTERIOR FUSION CERVICAL SPINE  02-26-2005   C5 -- C7   Social History   Occupational History  . Not on file.   Social History Main Topics  . Smoking status: Former Smoker    Packs/day: 3.00    Years: 21.00    Types: Cigarettes    Quit date: 08/18/2002  . Smokeless tobacco: Former NeurosurgeonUser  . Alcohol use Yes     Comment: RARE  . Drug use: No  . Sexual activity: Not on file

## 2016-11-12 ENCOUNTER — Other Ambulatory Visit: Payer: Self-pay | Admitting: Sports Medicine

## 2016-11-12 ENCOUNTER — Other Ambulatory Visit (INDEPENDENT_AMBULATORY_CARE_PROVIDER_SITE_OTHER): Payer: Self-pay | Admitting: Family

## 2016-11-12 ENCOUNTER — Telehealth (INDEPENDENT_AMBULATORY_CARE_PROVIDER_SITE_OTHER): Payer: Self-pay | Admitting: Orthopedic Surgery

## 2016-11-12 DIAGNOSIS — M5430 Sciatica, unspecified side: Secondary | ICD-10-CM

## 2016-11-12 MED ORDER — TRAMADOL HCL 50 MG PO TABS: 50.0000 mg | ORAL_TABLET | Freq: Three times a day (TID) | ORAL | 0 refills | Status: DC

## 2016-11-12 NOTE — Telephone Encounter (Signed)
Patient called asking for refill on tramadol. CB # [phone]

## 2016-11-12 NOTE — Telephone Encounter (Signed)
Patient called in this afternoon in regards to needing a prescription refill on his tramadol please if possible. He is having severe sciatic nerve pain in his back. [facility] [address] Thank you

## 2016-11-12 NOTE — Telephone Encounter (Signed)
Called pt to advise that rx has been called into pharm and he verbalized understanding of additional instructions will call for visit if s/s continue.

## 2016-11-20 ENCOUNTER — Emergency Department (HOSPITAL_COMMUNITY)
Admission: EM | Admit: 2016-11-20 | Discharge: 2016-11-20 | Disposition: A | Discharge status: Home / Self Care | Payer: Self-pay | Attending: Emergency Medicine | Admitting: Emergency Medicine

## 2016-11-20 DIAGNOSIS — M5441 Lumbago with sciatica, right side: Secondary | ICD-10-CM | POA: Insufficient documentation

## 2016-11-20 DIAGNOSIS — I10 Essential (primary) hypertension: Secondary | ICD-10-CM | POA: Insufficient documentation

## 2016-11-20 DIAGNOSIS — Z87891 Personal history of nicotine dependence: Secondary | ICD-10-CM | POA: Insufficient documentation

## 2016-11-20 DIAGNOSIS — K0889 Other specified disorders of teeth and supporting structures: Secondary | ICD-10-CM | POA: Insufficient documentation

## 2016-11-20 DIAGNOSIS — Z79899 Other long term (current) drug therapy: Secondary | ICD-10-CM | POA: Insufficient documentation

## 2016-11-20 MED ORDER — CLINDAMYCIN HCL 150 MG PO CAPS: 300.0000 mg | ORAL_CAPSULE | Freq: Four times a day (QID) | ORAL | 0 refills | Status: DC

## 2016-11-20 NOTE — ED Provider Notes (Signed)
[facility] Provider Note   CSN: [id number] Arrival date & time: 11/20/16  [id number]     History   Chief Complaint Chief Complaint  Patient presents with  . Back Pain  . Dental Pain    HPI Mike Watkins is a 51 y.o. male.  The history is provided by the patient and medical records. No language interpreter was used.  Back Pain   Pertinent negatives include no fever, no numbness, no abdominal pain and no weakness.  Dental Pain     Mike AntiguaRonald D Tse is a 51 y.o. male  with a PMH of HTN, arthritis, back pain who presents to the Emergency Department with two complaints:   1.  Persistent, left-sided, upper dental pain beginning 2-3 days ago. Pt describes their pain as throbbing. Pt has been taking Tramadol at home with minimal relief of pain. Has seen oral surgeon in the past for several tooth extractions but has not been seen for this episode of dental pain.  Pt denies  facial swelling, fever, chills, difficulty breathing, difficulty swallowing.   2. Persistent aching right-sided low back pain with radiation down right leg. Patient states this feels similar to his flares of sciatica. He has been seen by orthopedics in the past for same and states they typically give him tramadol. He has been taking this but it has not been helping. He is requesting something stronger for the pain as tramadol is not working. He does have tingling down his right leg which occurs with each flare of his sciatica. No fever, saddle anesthesia, weakness, numbness, urinary complaints including retention/incontinence. No history of cancer, IVDU, or recent spinal procedures.   Past Medical History:  Diagnosis Date  . Arthritis   . Hypertension   . Penile lesion     Patient Active Problem List   Diagnosis Date Noted  . Fall -multiple falls in the past year. 08/29/2016  . Acute pain of right knee 08/29/2016  . Contusion of right hand 07/14/2016  . Ganglion of flexor tendon sheath of right middle finger  06/02/2016  . Impingement syndrome of left shoulder 04/20/2016    Past Surgical History:  Procedure Laterality Date  . ANTERIOR CERVICAL DECOMP/DISCECTOMY FUSION  12-31-2002   C5 -- C7  . CYST EXCISION     pain in r/hand-middle finger  . HAND SURGERY    . PENILE BIOPSY N/A 08/23/2012   Procedure: EXCISIONAL BIOPSY OF PENILE LESION;  Surgeon: [name], MD;  [facility] ;  Service: Urology;  Laterality: N/A;  EXCISIONAL BIOPSY OF PENILE LESION    . POSTERIOR FUSION CERVICAL SPINE  02-26-2005   C5 -- C7       Home Medications    Prior to Admission medications   Medication Sig Start Date End Date Taking? Authorizing Provider  acetaminophen-codeine (TYLENOL #4) 300-60 MG tablet Take 1 tablet by mouth every 4 (four) hours as needed for moderate pain. 10/21/16   Adonis HugueninZamora, Erin R, NP  albuterol (PROVENTIL HFA;VENTOLIN HFA) 108 (90 Base) MCG/ACT inhaler Inhale 1-2 puffs into the lungs every 6 (six) hours as needed for wheezing or shortness of breath. 09/26/16   Doreene ElandEniola, Kehinde T, MD  clindamycin (CLEOCIN) 150 MG capsule Take 2 capsules (300 mg total) by mouth 4 (four) times daily. 11/20/16   Ward, Chase PicketJaime Pilcher, PA-C  cyclobenzaprine (FLEXERIL) 10 MG tablet Take 1 tablet (10 mg total) by mouth 3 (three) times daily as needed for muscle spasms. 10/21/16   Adonis HugueninZamora, Erin R, NP  Diclofenac Sodium (PENNSAID) 2 % SOLN Place 1 application onto the skin 2 (two) times daily. 08/25/16   [name], DO  losartan-hydrochlorothiazide (HYZAAR) 50-12.5 MG tablet Take 1 tablet by mouth daily.  06/16/16   [provider]  predniSONE (DELTASONE) 10 MG tablet 6 tablets for 2 days, then 5 for 2 days, then 4 for 2 days, then 3  for 2 days, then 2 for 2 days, then 1 tablet for 2 days 10/21/16   Adonis Huguenin, NP  traMADol (ULTRAM) 50 MG tablet Take 1 tablet (50 mg total) by mouth every 12 (twelve) hours as needed. 09/08/16   Cammy Copa, MD  traMADol (ULTRAM) 50 MG  tablet Take 1 tablet (50 mg total) by mouth every 8 (eight) hours. 11/12/16   Adonis Huguenin, NP    Family History Family History  Problem Relation Age of Onset  . Hypertension Mother   . Diabetes Mother   . Hypertension Father   . Cancer Father     Social History Social History  Substance Use Topics  . Smoking status: Former Smoker    Packs/day: 3.00    Years: 21.00    Types: Cigarettes    Quit date: 08/18/2002  . Smokeless tobacco: Former Neurosurgeon  . Alcohol use Yes     Comment: RARE     Allergies   Penicillins and Sulfa antibiotics   Review of Systems Review of Systems  Constitutional: Negative for chills and fever.  HENT: Positive for dental problem. Negative for trouble swallowing.   Respiratory: Negative for shortness of breath.   Gastrointestinal: Negative for abdominal pain.  Genitourinary: Negative for difficulty urinating.  Musculoskeletal: Positive for back pain.  Neurological: Negative for weakness and numbness.       + tingling     Physical Exam Updated Vital Signs BP (!) 145/91 (BP Location: Right Arm)   Pulse (!) 104   Temp 97.5 F (36.4 C) (Oral)   Resp 20   Ht 5\' 10"  (1.778 m)   Wt 119.3 kg (263 lb)   SpO2 98%   BMI 37.74 kg/m   Physical Exam  Constitutional: He is oriented to person, place, and time. He appears well-developed and well-nourished. No distress.  HENT:  Head: Normocephalic and atraumatic.  Mouth/Throat:    Dental cavities and poor oral dentition noted. Pain along tooth as depicted in image. No abscess noted. Midline uvula. No trismus. OP moist and clear. No oropharyngeal erythema or edema. Neck supple with no tenderness. No facial edema.  Cardiovascular: Normal rate, regular rhythm and normal heart sounds.   No murmur heard. Pulmonary/Chest: Effort normal and breath sounds normal. No respiratory distress.  Abdominal: Soft. He exhibits no distension. There is no tenderness.  Musculoskeletal:       Arms: TTP of right lumbar  back as depicted in image. Straight leg raises negative bilaterally for radicular symptoms. 5/5 muscle strength of all 4 extremities.  Neurological: He is alert and oriented to person, place, and time.  Bilateral lower extremities neurovascularly intact.   Skin: Skin is warm and dry.  Nursing note and vitals reviewed.    ED Treatments / Results  Labs (all labs ordered are listed, but only abnormal results are displayed) Labs Reviewed - No data to display  EKG  EKG Interpretation None       Radiology No results found.  Procedures Procedures (including critical care time)  Medications Ordered in ED Medications - No data to display   Initial Impression /  Assessment and Plan / ED Course  I have reviewed the triage vital signs and the nursing notes.  Pertinent labs & imaging results that were available during my care of the patient were reviewed by me and considered in my medical decision making (see chart for details).    ALLEN EGERTON is a 51 y.o. male who presents to ED for two complaints:   1. Dental pain.  No abscess requiring immediate incision and drainage. Patient is afebrile, non toxic appearing, and swallowing secretions well. Exam not concerning for Ludwig's angina or pharyngeal abscess. Will treat with clinda. I provided dental resource guide and stressed the importance of dental follow up for ultimate management of dental pain. Patient voices understanding and is agreeable to plan.  2. Right low back pain. Patient demonstrates no lower extremity weakness, saddle anesthesia, bowel or bladder incontinence or neuro deficits. No concern for cauda equina. No fevers or other infectious symptoms to suggest that the patient's back pain is due to an infection. Lower extremities are neurovascularly intact and patient is ambulating without difficulty. He is followed by [facility] and I have encouraged him to call their office first thing in the morning to schedule a  follow up appointment. [place] controlled substance database consulted. He just received rx for 30 tramadol on 11/12/2016. He is requesting stronger pain medication or at least refill of tramadol. Discussed with patient that I did not believe this was indicated. Offered decadron and toradol IM injection for symptomatic relief which patient declined. Discussed that the best medical care for him would be to follow up with his orthopedist as they would like to know if meds are not controlling pain and may order further imaging of the back. Patient understanding and agrees to schedule follow up appt.   Reasons to return to ER were discussed. All questions answered. Patient discharged in satisfactory condition.    Final Clinical Impressions(s) / ED Diagnoses   Final diagnoses:  Acute right-sided low back pain with right-sided sciatica  Toothache    New Prescriptions New Prescriptions   CLINDAMYCIN (CLEOCIN) 150 MG CAPSULE    Take 2 capsules (300 mg total) by mouth 4 (four) times daily.     Ward, Chase Picket, PA-C 11/20/16 4098    Azalia Bilis, MD 11/20/16 1003

## 2016-11-20 NOTE — Discharge Instructions (Signed)
It was my pleasure taking care of you today!  I hope you feel better soon.   You need to call your orthopedist clinic first thing Monday morning to schedule a follow up appointment for further discussion on your ongoing back pain.   You also need to see a dentist for your toothache. Call the dentist of your choice or see the attached resource guide for clinics you can call.   Return to ER for fevers, difficulty with your bowel or bladder, new or worsening symptoms, any additional concerns.

## 2016-11-20 NOTE — ED Notes (Signed)
Patient is A & O x4.  He understood AVS instructions.  

## 2016-12-01 ENCOUNTER — Telehealth (INDEPENDENT_AMBULATORY_CARE_PROVIDER_SITE_OTHER): Payer: Self-pay | Admitting: Orthopedic Surgery

## 2016-12-01 MED ORDER — TRAMADOL HCL 50 MG PO TABS: 50.0000 mg | ORAL_TABLET | Freq: Three times a day (TID) | ORAL | 0 refills | Status: DC

## 2016-12-01 NOTE — Telephone Encounter (Signed)
y

## 2016-12-01 NOTE — Telephone Encounter (Signed)
Patient called needing Rx refilled (Tramadol) The number to contact patient is [phone]

## 2016-12-01 NOTE — Telephone Encounter (Signed)
IC advised rx submitted.  

## 2016-12-01 NOTE — Telephone Encounter (Signed)
Ok to rf? 

## 2016-12-06 ENCOUNTER — Encounter (INDEPENDENT_AMBULATORY_CARE_PROVIDER_SITE_OTHER): Payer: Self-pay | Admitting: Orthopedic Surgery

## 2016-12-06 ENCOUNTER — Ambulatory Visit (INDEPENDENT_AMBULATORY_CARE_PROVIDER_SITE_OTHER): Payer: Self-pay | Admitting: Orthopedic Surgery

## 2016-12-06 ENCOUNTER — Ambulatory Visit (INDEPENDENT_AMBULATORY_CARE_PROVIDER_SITE_OTHER): Payer: Self-pay

## 2016-12-06 DIAGNOSIS — M25511 Pain in right shoulder: Secondary | ICD-10-CM

## 2016-12-07 NOTE — Progress Notes (Signed)
Office Visit Note   Patient: Mike Watkins           Date of Birth: [birth year]           MRN: [id number] Visit Date: 12/06/2016 Requested by: Lewis Moccasinewey, Elizabeth R, MD [address] [place], [place] [id number] PCP: Lewis Moccasinewey, Elizabeth R, MD  Subjective: Chief Complaint  Patient presents with  . Right Shoulder - Pain    HPI: Mike Watkins is a 51 year old patient with right shoulder pain.  Injured his shoulder one week ago pulling a tailgate.  Localizes the pain to the right shoulder area.  Reports some occasional numbness and tingling but in general is mostly pain.  He will wake up with the pain.  Hurts him to push himself up from a seated position or to do pushup type activities.  He tried to swim and he could not do it.  He denies any thing in the way of new mechanical symptoms in the shoulder              ROS: All systems reviewed are negative as they relate to the chief complaint within the history of present illness.  Patient denies  fevers or chills.   Assessment & Plan: Visit Diagnoses:  1. Acute pain of right shoulder     Plan: Impression is right shoulder pain with normal x-rays and fairly normal exam.  I would try him on to axis for 9 days.  He may call to get his tramadol refilled on June 30.  We'll see him back in 4 weeks if his symptoms are not improved.  May need to consider injection or further imaging at that time.  Follow-Up Instructions: Return if symptoms worsen or fail to improve.   Orders:  Orders Placed This Encounter  Procedures  . XR Shoulder Right   No orders of the defined types were placed in this encounter.     Procedures: No procedures performed   Clinical Data: No additional findings.  Objective: Vital Signs: There were no vitals taken for this visit.  Physical Exam:   Constitutional: Patient appears well-developed HEENT:  Head: Normocephalic Eyes:EOM are normal Neck: Normal range of motion Cardiovascular: Normal  rate Pulmonary/chest: Effort normal Neurologic: Patient is alert Skin: Skin is warm Psychiatric: Patient has normal mood and affect    Ortho Exam: Orthopedic exam demonstrates good cervical spine range of motion normal motor sensory examination of the right arm compared to the left arm.  He has full active and passive range of motion of the right shoulder with no restriction of external rotation at 15 of abduction.  Negative apprehension relocation testing negative O'Brien's testing good rotator cuff strength isolatedsuper space and subscap muscle testing.  No acromial clavicular joint tenderness to direct palpation or with crossarm adduction.  Impingement signs mildly positive on the right negative on the left  Specialty Comments:  No specialty comments available.  Imaging: Xr Shoulder Right  Result Date: 12/07/2016 AP outlet and axillary right shoulder reviewed.  Shoulder is located.  Acromiohumeral distance is maintained.  No acromioclavicular joint arthritis is present.  No fractures present.  Visualized lung fields normal.  Normal shoulder.    PMFS History: Patient Active Problem List   Diagnosis Date Noted  . Fall -multiple falls in the past year. 08/29/2016  . Acute pain of right knee 08/29/2016  . Contusion of right hand 07/14/2016  . Ganglion of flexor tendon sheath of right middle finger 06/02/2016  . Impingement syndrome of  left shoulder 04/20/2016   Past Medical History:  Diagnosis Date  . Arthritis   . Hypertension   . Penile lesion     Family History  Problem Relation Age of Onset  . Hypertension Mother   . Diabetes Mother   . Hypertension Father   . Cancer Father     Past Surgical History:  Procedure Laterality Date  . ANTERIOR CERVICAL DECOMP/DISCECTOMY FUSION  12-31-2002   C5 -- C7  . CYST EXCISION     pain in r/hand-middle finger  . HAND SURGERY    . PENILE BIOPSY N/A 08/23/2012   Procedure: EXCISIONAL BIOPSY OF PENILE LESION;  Surgeon: Milford Cage, MD;  Location: [facility];  Service: Urology;  Laterality: N/A;  EXCISIONAL BIOPSY OF PENILE LESION    . POSTERIOR FUSION CERVICAL SPINE  02-26-2005   C5 -- C7   Social History   Occupational History  . Not on file.   Social History Main Topics  . Smoking status: Former Smoker    Packs/day: 3.00    Years: 21.00    Types: Cigarettes    Quit date: 08/18/2002  . Smokeless tobacco: Former Neurosurgeon  . Alcohol use Yes     Comment: RARE  . Drug use: No  . Sexual activity: Not on file

## 2016-12-09 ENCOUNTER — Telehealth (INDEPENDENT_AMBULATORY_CARE_PROVIDER_SITE_OTHER): Payer: Self-pay

## 2016-12-09 ENCOUNTER — Telehealth (INDEPENDENT_AMBULATORY_CARE_PROVIDER_SITE_OTHER): Payer: Self-pay | Admitting: Orthopedic Surgery

## 2016-12-09 NOTE — Telephone Encounter (Signed)
[facility] [address]

## 2016-12-09 NOTE — Telephone Encounter (Signed)
Patient called needing Rx refilled (Tramadol) The number to contact patient is [phone]

## 2016-12-09 NOTE — Telephone Encounter (Signed)
Ok to rf? Just given #30 on 06/20

## 2016-12-10 MED ORDER — TRAMADOL HCL 50 MG PO TABS: 50.0000 mg | ORAL_TABLET | Freq: Two times a day (BID) | ORAL | 0 refills | Status: DC | PRN

## 2016-12-10 NOTE — Telephone Encounter (Signed)
Ok to rf on 6/30

## 2016-12-10 NOTE — Addendum Note (Signed)
Addended byPrescott Parma: Jeylin Woodmansee on: 12/10/2016 11:00 AM   Modules accepted: Orders

## 2016-12-10 NOTE — Telephone Encounter (Signed)
rx called to pharmacy. Patient aware will not fill until 06/30.

## 2016-12-29 ENCOUNTER — Telehealth (INDEPENDENT_AMBULATORY_CARE_PROVIDER_SITE_OTHER): Payer: Self-pay | Admitting: Orthopedic Surgery

## 2016-12-29 NOTE — Telephone Encounter (Signed)
Please advise 

## 2016-12-29 NOTE — Telephone Encounter (Signed)
PT REQUESTS REFILL OF TRAMADOL PLEASE.  [phone]

## 2016-12-30 MED ORDER — TRAMADOL HCL 50 MG PO TABS: ORAL_TABLET | ORAL | 0 refills | Status: DC

## 2016-12-30 NOTE — Addendum Note (Signed)
Addended byPrescott Parma: Makenze Ellett on: 12/30/2016 09:12 AM   Modules accepted: Orders

## 2016-12-30 NOTE — Telephone Encounter (Signed)
Ok for 30 1 po q d pls clal thx

## 2016-12-30 NOTE — Telephone Encounter (Signed)
Called to pharmacy advised patient done.  

## 2017-01-10 ENCOUNTER — Ambulatory Visit (INDEPENDENT_AMBULATORY_CARE_PROVIDER_SITE_OTHER): Payer: Self-pay

## 2017-01-10 ENCOUNTER — Ambulatory Visit (INDEPENDENT_AMBULATORY_CARE_PROVIDER_SITE_OTHER): Payer: Self-pay | Admitting: Orthopaedic Surgery

## 2017-01-10 DIAGNOSIS — M25562 Pain in left knee: Secondary | ICD-10-CM

## 2017-01-10 DIAGNOSIS — G8929 Other chronic pain: Secondary | ICD-10-CM

## 2017-01-10 MED ORDER — METHYLPREDNISOLONE 4 MG PO TABS: ORAL_TABLET | ORAL | 0 refills | Status: DC

## 2017-01-10 MED ORDER — ACETAMINOPHEN-CODEINE #3 300-30 MG PO TABS: 1.0000 | ORAL_TABLET | Freq: Three times a day (TID) | ORAL | 0 refills | Status: DC | PRN

## 2017-01-10 NOTE — Progress Notes (Signed)
Office Visit Note   Patient: Mike AntiguaRonald D Joles           Date of Birth: [birth year]           MRN: [id number] Visit Date: 01/10/2017              Requested by: Lewis Moccasinewey, Elizabeth R, MD [address] [place], [place] [id number] PCP: Lewis Moccasinewey, Elizabeth R, MD   Assessment & Plan: Visit Diagnoses:  1. Chronic pain of left knee     Plan: Given his mechanical symptoms we need to obtain an MRI to rule out a meniscal tear of his left knee. He is tried rest, ice, heat, time and activity modification. We will put him on a six-day steroid taper and some pain medication. 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 We will obtain an MRI to rule out a meniscal tear of the left knee.  Follow-Up Instructions: Return in about 2 weeks (around 01/24/2017).   Orders:  Orders Placed This Encounter  Procedures  . XR Knee 1-2 Views Left   Meds ordered this encounter  Medications  . methylPREDNISolone (MEDROL) 4 MG tablet    Sig: Medrol dose pack. Take as instructed    Dispense:  21 tablet    Refill:  0  . acetaminophen-codeine (TYLENOL #3) 300-30 MG tablet    Sig: Take 1-2 tablets by mouth every 8 (eight) hours as needed for moderate pain.    Dispense:  60 tablet    Refill:  0      Procedures: No procedures performed   Clinical Data: No additional findings.   Subjective: No chief complaint on file. The patient is been seen by the office before. He is acute issue with his left knee. He was coming down some stairs and felt something pop in his knee. He is walking with a limp now is having significant locking catching. 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 He denies any specific injuries to that knee before. He points the medial joint line as source of his pain.  HPI  Review of Systems He currently denies any chest pain, shortness  of breath, fever, chills, nausea, vomiting, headache  Objective: Vital Signs: There were no vitals taken for this visit.  Physical Exam He is alert or 3 and in no acute distress Ortho Exam Examination of his left knee shows varus malalignment. There is a slight effusion. There is medial joint line tenderness and a positive McMurray sign to the medial side of his knee. His range of motion is full but very painful. Once I get him past 90 of flexion he has severe pain. Specialty Comments:  No specialty comments available.  Imaging: Xr Knee 1-2 Views Left  Result Date: 01/10/2017 An AP and lateral left knee show no acute findings. There is varus malalignment and medial joint space narrowing. There is no effusion.    PMFS History: Patient Active Problem List   Diagnosis Date Noted  . Fall -multiple falls in the past year. 08/29/2016  . Acute pain of right knee 08/29/2016  . Contusion of right hand 07/14/2016  . Ganglion of flexor tendon sheath of right middle finger 06/02/2016  . Impingement syndrome of left shoulder 04/20/2016   Past Medical History:  Diagnosis Date  . Arthritis   .  Hypertension   . Penile lesion     Family History  Problem Relation Age of Onset  . Hypertension Mother   . Diabetes Mother   . Hypertension Father   . Cancer Father     Past Surgical History:  Procedure Laterality Date  . ANTERIOR CERVICAL DECOMP/DISCECTOMY FUSION  12-31-2002   C5 -- C7  . CYST EXCISION     pain in r/hand-middle finger  . HAND SURGERY    . PENILE BIOPSY N/A 08/23/2012   Procedure: EXCISIONAL BIOPSY OF PENILE LESION;  Surgeon: [name], MD;  [facility];  Service: Urology;  Laterality: N/A;  EXCISIONAL BIOPSY OF PENILE LESION    . POSTERIOR FUSION CERVICAL SPINE  02-26-2005   C5 -- C7   Social History   Occupational History  . Not on file.   Social History Main Topics  . Smoking status: Former Smoker    Packs/day: 3.00     Years: 21.00    Types: Cigarettes    Quit date: 08/18/2002  . Smokeless tobacco: Former NeurosurgeonUser  . Alcohol use Yes     Comment: RARE  . Drug use: No  . Sexual activity: Not on file

## 2017-01-26 ENCOUNTER — Ambulatory Visit (INDEPENDENT_AMBULATORY_CARE_PROVIDER_SITE_OTHER): Payer: Self-pay | Admitting: Orthopaedic Surgery

## 2017-01-27 ENCOUNTER — Telehealth (INDEPENDENT_AMBULATORY_CARE_PROVIDER_SITE_OTHER): Payer: Self-pay | Admitting: Orthopaedic Surgery

## 2017-01-27 ENCOUNTER — Other Ambulatory Visit (INDEPENDENT_AMBULATORY_CARE_PROVIDER_SITE_OTHER): Payer: Self-pay

## 2017-01-27 MED ORDER — ACETAMINOPHEN-CODEINE #3 300-30 MG PO TABS: 1.0000 | ORAL_TABLET | Freq: Three times a day (TID) | ORAL | 0 refills | Status: DC | PRN

## 2017-01-27 NOTE — Telephone Encounter (Signed)
Please advise 

## 2017-01-27 NOTE — Telephone Encounter (Signed)
That will be fine, 1-2 every 8-12 hours as needed, no refills

## 2017-01-27 NOTE — Telephone Encounter (Signed)
Called into pharmacy

## 2017-01-27 NOTE — Telephone Encounter (Signed)
PT REQUESTED REFILL OF TYLENOL 3  [phone]

## 2017-02-07 ENCOUNTER — Ambulatory Visit (INDEPENDENT_AMBULATORY_CARE_PROVIDER_SITE_OTHER): Payer: Self-pay | Admitting: Orthopaedic Surgery

## 2017-02-07 ENCOUNTER — Telehealth (INDEPENDENT_AMBULATORY_CARE_PROVIDER_SITE_OTHER): Payer: Self-pay | Admitting: Radiology

## 2017-02-07 ENCOUNTER — Encounter (INDEPENDENT_AMBULATORY_CARE_PROVIDER_SITE_OTHER): Payer: Self-pay | Admitting: Orthopaedic Surgery

## 2017-02-07 DIAGNOSIS — M25562 Pain in left knee: Secondary | ICD-10-CM

## 2017-02-07 DIAGNOSIS — G8929 Other chronic pain: Secondary | ICD-10-CM | POA: Insufficient documentation

## 2017-02-07 NOTE — Telephone Encounter (Signed)
Unfortunately I cannot give a note that states that narcotics in any one system is safe to drive on.

## 2017-02-07 NOTE — Telephone Encounter (Signed)
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 Magnus Ivan stating that he is ok to drive with him taking the Tylenol #3 that it will not effect him driving safely--- Please fax note to [phone], Please call patient to advise when this is done.

## 2017-02-07 NOTE — Telephone Encounter (Signed)
IC LM for patient advising per Dr Magnus Ivan.

## 2017-02-07 NOTE — Telephone Encounter (Signed)
Please advise if ok for note?

## 2017-02-07 NOTE — Progress Notes (Signed)
Patient is here today for his chronic left knee pain. We will obtain an MRI of his knee but he said between the pain medications the injection knee brace as well as time and quad strengthening exercises he is getting better. He works for the [employer], [employer]. 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 He understands that I will only clear him for driving if he is not taking pain medications with her even Tylenol 3 and being behind the will. He understands that although these medications use a do not cause any type of lethargy there still narcotics.  His knee appears stable on exam otherwise.  At all happy to give a note saying that I'm taking him off the narcotics at this point he says he would like to have the notes say that. He'll follow up as needed. If his knee bothers him on next up would still be to obtain an MRI of his left knee to rule out a meniscal tear. QUESTIONS were encouraged and answered.

## 2017-02-09 ENCOUNTER — Other Ambulatory Visit (INDEPENDENT_AMBULATORY_CARE_PROVIDER_SITE_OTHER): Payer: Self-pay | Admitting: Radiology

## 2017-02-09 NOTE — Telephone Encounter (Signed)
Ok to rf? 

## 2017-02-09 NOTE — Telephone Encounter (Signed)
Patient called to request a refill on his tramadol.  Please call to advise

## 2017-02-10 ENCOUNTER — Telehealth (INDEPENDENT_AMBULATORY_CARE_PROVIDER_SITE_OTHER): Payer: Self-pay | Admitting: Orthopedic Surgery

## 2017-02-10 NOTE — Telephone Encounter (Signed)
Patient called needing Rx for (Tramadol) The number to contact patient is [phone]

## 2017-02-10 NOTE — Telephone Encounter (Signed)
Ok to rf tuesday

## 2017-02-10 NOTE — Telephone Encounter (Signed)
Ok to rf? 

## 2017-02-11 MED ORDER — TRAMADOL HCL 50 MG PO TABS: 50.0000 mg | ORAL_TABLET | Freq: Three times a day (TID) | ORAL | 0 refills | Status: DC

## 2017-02-11 NOTE — Telephone Encounter (Signed)
Called to pharmacy. LM for patient advising.

## 2017-02-11 NOTE — Telephone Encounter (Signed)
Called to pharmacy. LM for patient advising.  

## 2017-03-02 ENCOUNTER — Ambulatory Visit (INDEPENDENT_AMBULATORY_CARE_PROVIDER_SITE_OTHER): Payer: Self-pay | Admitting: Orthopedic Surgery

## 2017-03-04 ENCOUNTER — Telehealth (INDEPENDENT_AMBULATORY_CARE_PROVIDER_SITE_OTHER): Payer: Self-pay | Admitting: Orthopaedic Surgery

## 2017-03-04 ENCOUNTER — Other Ambulatory Visit (INDEPENDENT_AMBULATORY_CARE_PROVIDER_SITE_OTHER): Payer: Self-pay

## 2017-03-04 MED ORDER — ACETAMINOPHEN-CODEINE #3 300-30 MG PO TABS: 1.0000 | ORAL_TABLET | Freq: Three times a day (TID) | ORAL | 0 refills | Status: DC | PRN

## 2017-03-04 NOTE — Telephone Encounter (Signed)
Please advise 

## 2017-03-04 NOTE — Telephone Encounter (Signed)
Patient called asking for a refill on Tylenol 3's. CB #  [phone]

## 2017-03-04 NOTE — Telephone Encounter (Signed)
Ok to refill 

## 2017-03-04 NOTE — Telephone Encounter (Signed)
Called into pharmacy

## 2017-03-16 ENCOUNTER — Telehealth (INDEPENDENT_AMBULATORY_CARE_PROVIDER_SITE_OTHER): Payer: Self-pay | Admitting: Orthopedic Surgery

## 2017-03-16 NOTE — Telephone Encounter (Signed)
IC no answer. LMVM with details advising per Dr August Saucer

## 2017-03-16 NOTE — Telephone Encounter (Signed)
Please advise. Patient just received #60 T#3 on 09/21 from Dr Magnus Ivan and on 8/31 #30 ultram from you.

## 2017-03-16 NOTE — Telephone Encounter (Signed)
n

## 2017-03-16 NOTE — Telephone Encounter (Signed)
Patient called asking for a refill on tramadol. CB # [phone]

## 2017-03-20 ENCOUNTER — Other Ambulatory Visit (INDEPENDENT_AMBULATORY_CARE_PROVIDER_SITE_OTHER): Payer: Self-pay | Admitting: Orthopaedic Surgery

## 2017-03-21 NOTE — Telephone Encounter (Signed)
Please advise 

## 2017-03-21 NOTE — Telephone Encounter (Signed)
Called into pharmacy

## 2017-03-28 ENCOUNTER — Ambulatory Visit (INDEPENDENT_AMBULATORY_CARE_PROVIDER_SITE_OTHER): Payer: Self-pay | Admitting: Orthopedic Surgery

## 2017-03-28 ENCOUNTER — Encounter (INDEPENDENT_AMBULATORY_CARE_PROVIDER_SITE_OTHER): Payer: Self-pay | Admitting: Orthopedic Surgery

## 2017-03-28 DIAGNOSIS — M1712 Unilateral primary osteoarthritis, left knee: Secondary | ICD-10-CM

## 2017-03-28 DIAGNOSIS — M1711 Unilateral primary osteoarthritis, right knee: Secondary | ICD-10-CM

## 2017-03-28 MED ORDER — TRAMADOL HCL 50 MG PO TABS: ORAL_TABLET | ORAL | 0 refills | Status: DC

## 2017-03-28 NOTE — Progress Notes (Signed)
Office Visit Note   Patient: Mike Watkins           Date of Birth: Feb 01, 1966           MRN: [id number] Visit Date: 03/28/2017 Requested by: [name], MD [address] [place], [place] [address] PCP: [name], MD  Subjective: Chief Complaint  Patient presents with  . Left Knee - Pain  . Right Knee - Pain    HPI: Mike Watkins is a 51 year old patient with bilateral knee pain.  Describes chronic pain.  Left is worse than the right.  Radiographs show mild degenerative joint disease from ovary year ago.  Patient is a Naval architect.  Takes tramadol on Sundays.  Had Tylenol 3 prescribed 921.patient denies much in the way of mechanical symptoms              ROS: All systems reviewed are negative as they relate to the chief complaint within the history of present illness.  Patient denies  fevers or chills.   Assessment & Plan: Visit Diagnoses: No diagnosis found.  Plan: impression is bilateral knee pain left worse than right with mild effusion in the left knee.  Plan is to continue nonweightbearing quad strengthening exercises.  Return after Thanksgiving.  We will decide then about further imaging and/or arthroscopic intervention in the knee.  Tramadol prescription written 1 per day for 6 weeks.  Follow-Up Instructions: Return if symptoms worsen or fail to improve.   Orders:  No orders of the defined types were placed in this encounter.  Meds ordered this encounter  Medications  . traMADol (ULTRAM) 50 MG tablet    Sig: 1 po q d    Dispense:  45 tablet    Refill:  0      Procedures: No procedures performed   Clinical Data: No additional findings.  Objective: Vital Signs: There were no vitals taken for this visit.  Physical Exam:   Constitutional: Patient appears well-developed HEENT:  Head: Normocephalic Eyes:EOM are normal Neck: Normal range of motion Cardiovascular: Normal rate Pulmonary/chest: Effort normal Neurologic: Patient is  alert Skin: Skin is warm Psychiatric: Patient has normal mood and affect    Ortho Exam: orthopedic exam demonstrates mild left knee effusion no right knee effusion collateral and cruciate ligaments are stable.  Pedal pulses palpable.  Range of motion both knees is full.  There is no groin pain with internal/external rotation of the leg.ther masses lymph adenopathy or skin changes noted in the knee re  Specialty Comments:  No specialty comments available.  Imaging: No results found.   PMFS History: Patient Active Problem List   Diagnosis Date Noted  . Chronic pain of left knee 02/07/2017  . Fall -multiple falls in the past year. 08/29/2016  . Acute pain of right knee 08/29/2016  . Contusion of right hand 07/14/2016  . Ganglion of flexor tendon sheath of right middle finger 06/02/2016  . Impingement syndrome of left shoulder 04/20/2016   Past Medical History:  Diagnosis Date  . Arthritis   . Hypertension   . Penile lesion     Family History  Problem Relation Age of Onset  . Hypertension Mother   . Diabetes Mother   . Hypertension Father   . Cancer Father     Past Surgical History:  Procedure Laterality Date  . ANTERIOR CERVICAL DECOMP/DISCECTOMY FUSION  12-31-2002   C5 -- C7  . CYST EXCISION     pain in r/hand-middle finger  .  HAND SURGERY    . PENILE BIOPSY N/A 08/23/2012   Procedure: EXCISIONAL BIOPSY OF PENILE LESION;  Surgeon: Milford Cage, MD;  Location: [facility];  Service: Urology;  Laterality: N/A;  EXCISIONAL BIOPSY OF PENILE LESION    . POSTERIOR FUSION CERVICAL SPINE  02-26-2005   C5 -- C7   Social History   Occupational History  . Not on file.   Social History Main Topics  . Smoking status: Former Smoker    Packs/day: 3.00    Years: 21.00    Types: Cigarettes    Quit date: 08/18/2002  . Smokeless tobacco: Former Neurosurgeon  . Alcohol use Yes     Comment: RARE  . Drug use: No  . Sexual activity: Not on file

## 2017-04-11 ENCOUNTER — Telehealth (INDEPENDENT_AMBULATORY_CARE_PROVIDER_SITE_OTHER): Payer: Self-pay | Admitting: Orthopaedic Surgery

## 2017-04-11 NOTE — Telephone Encounter (Signed)
Please advise 

## 2017-04-11 NOTE — Telephone Encounter (Signed)
Ok to call this in, 1-2 every 8-12 hours as needed, # 60, no refills

## 2017-04-11 NOTE — Telephone Encounter (Signed)
Rx refill Tylenol 3/ [facility] [address]

## 2017-04-12 ENCOUNTER — Other Ambulatory Visit (INDEPENDENT_AMBULATORY_CARE_PROVIDER_SITE_OTHER): Payer: Self-pay

## 2017-04-12 MED ORDER — ACETAMINOPHEN-CODEINE #3 300-30 MG PO TABS: 1.0000 | ORAL_TABLET | Freq: Four times a day (QID) | ORAL | 0 refills | Status: DC | PRN

## 2017-04-12 NOTE — Telephone Encounter (Signed)
Called into pharmacy

## 2017-04-13 ENCOUNTER — Ambulatory Visit (INDEPENDENT_AMBULATORY_CARE_PROVIDER_SITE_OTHER): Payer: Self-pay | Admitting: Orthopedic Surgery

## 2017-04-23 ENCOUNTER — Encounter (HOSPITAL_COMMUNITY): Payer: Self-pay | Admitting: Emergency Medicine

## 2017-04-23 ENCOUNTER — Emergency Department (HOSPITAL_COMMUNITY)
Admission: EM | Admit: 2017-04-23 | Discharge: 2017-04-23 | Disposition: A | Discharge status: Home / Self Care | Payer: Self-pay | Attending: Emergency Medicine | Admitting: Emergency Medicine

## 2017-04-23 DIAGNOSIS — Z87891 Personal history of nicotine dependence: Secondary | ICD-10-CM | POA: Insufficient documentation

## 2017-04-23 DIAGNOSIS — M25511 Pain in right shoulder: Secondary | ICD-10-CM

## 2017-04-23 DIAGNOSIS — Z79899 Other long term (current) drug therapy: Secondary | ICD-10-CM | POA: Insufficient documentation

## 2017-04-23 MED ORDER — CYCLOBENZAPRINE HCL 10 MG PO TABS: 10.0000 mg | ORAL_TABLET | Freq: Every evening | ORAL | 0 refills | Status: DC | PRN

## 2017-04-23 MED ORDER — OMEPRAZOLE 20 MG PO CPDR: 20.0000 mg | DELAYED_RELEASE_CAPSULE | Freq: Every day | ORAL | 0 refills | Status: DC

## 2017-04-23 MED ORDER — NAPROXEN 500 MG PO TABS: 500.0000 mg | ORAL_TABLET | Freq: Two times a day (BID) | ORAL | 0 refills | Status: DC

## 2017-04-23 NOTE — ED Provider Notes (Signed)
[facility]-EMERGENCY DEPT Provider Note   CSN: [id number] Arrival date & time: 04/23/17  0919     History   Chief Complaint Chief Complaint  Patient presents with  . Shoulder Pain    HPI Mike AntiguaRonald D Watkins is a 51 y.o. male with a history of arthritis and hypertension who presents the ED today for right shoulder pain x1 day.  Patient states while at work yesterday he had to lift and throw aluminum rims from the ground into a storage crate that was above his head.  He is primarily using his right arm and shoulder.  Later that night he started having dull, achy pain in the right shoulder that was worsened with lying down on the affected side.  Since then the patient has had pain with abduction, internal rotation and external rotation of the shoulder.  There is no radiation of the pain into the patient's neck, chest, or down the patient's arm.  There is no associated numbness or tingling.  No weakness.  He states his wife did have a Vicodin at home for which she took for pain and provided him mild/moderate relief.  Patient has a orthopedist for his left knee and has an appointment with him on the 19th.  HPI  Past Medical History:  Diagnosis Date  . Arthritis   . Hypertension   . Penile lesion     Patient Active Problem List   Diagnosis Date Noted  . Chronic pain of left knee 02/07/2017  . Fall -multiple falls in the past year. 08/29/2016  . Acute pain of right knee 08/29/2016  . Contusion of right hand 07/14/2016  . Ganglion of flexor tendon sheath of right middle finger 06/02/2016  . Impingement syndrome of left shoulder 04/20/2016    Past Surgical History:  Procedure Laterality Date  . ANTERIOR CERVICAL DECOMP/DISCECTOMY FUSION  12-31-2002   C5 -- C7  . CYST EXCISION     pain in r/hand-middle finger  . HAND SURGERY    . POSTERIOR FUSION CERVICAL SPINE  02-26-2005   C5 -- C7       Home Medications    Prior to Admission medications   Medication  Sig Start Date End Date Taking? Authorizing Provider  acetaminophen-codeine (TYLENOL #3) 300-30 MG tablet Take 1-2 tablets by mouth every 6 (six) hours as needed for moderate pain. 04/12/17   Kathryne HitchBlackman, Christopher Y, MD  albuterol (PROVENTIL HFA;VENTOLIN HFA) 108 (90 Base) MCG/ACT inhaler Inhale 1-2 puffs into the lungs every 6 (six) hours as needed for wheezing or shortness of breath. 09/26/16   Doreene ElandEniola, Kehinde T, MD  clindamycin (CLEOCIN) 150 MG capsule Take 2 capsules (300 mg total) by mouth 4 (four) times daily. 11/20/16   Ward, Chase PicketJaime Pilcher, PA-C  cyclobenzaprine (FLEXERIL) 10 MG tablet Take 1 tablet (10 mg total) by mouth 3 (three) times daily as needed for muscle spasms. 10/21/16   Adonis HugueninZamora, Erin R, NP  Diclofenac Sodium (PENNSAID) 2 % SOLN Place 1 application onto the skin 2 (two) times daily. 08/25/16   Andrena Mewsigby, Anahita Cua D, DO  losartan-hydrochlorothiazide (HYZAAR) 50-12.5 MG tablet Take 1 tablet by mouth daily.  06/16/16   [provider]  methylPREDNISolone (MEDROL) 4 MG tablet Medrol dose pack. Take as instructed 01/10/17   Kathryne HitchBlackman, Christopher Y, MD  predniSONE (DELTASONE) 10 MG tablet 6 tablets for 2 days, then 5 for 2 days, then 4 for 2 days, then 3  for 2 days, then 2 for 2 days, then 1 tablet for 2  days 10/21/16   Adonis Huguenin, NP  traMADol [name] 50 MG tablet 1 po q d 03/28/17   Cammy Copa, MD    Family History Family History  Problem Relation Age of Onset  . Hypertension Mother   . Diabetes Mother   . Hypertension Father   . Cancer Father     Social History Social History   Tobacco Use  . Smoking status: Former Smoker    Packs/day: 3.00    Years: 21.00    Pack years: 63.00    Types: Cigarettes    Last attempt to quit: 08/18/2002    Years since quitting: 14.6  . Smokeless tobacco: Former Engineer, water Use Topics  . Alcohol use: Yes    Comment: RARE  . Drug use: No     Allergies   Penicillins and Sulfa antibiotics   Review of Systems Review of  Systems  Constitutional: Negative for fever.  Musculoskeletal: Positive for arthralgias.  Skin: Negative for wound.  Neurological: Negative for weakness and numbness.     Physical Exam Updated Vital Signs BP (!) 175/93 (BP Location: Left Arm)   Pulse 88   Temp 98 F (36.7 C) (Oral)   Resp 16   Ht 5\' 10"  (1.778 m)   SpO2 98%   BMI 37.74 kg/m   Physical Exam  Constitutional: He appears well-developed and well-nourished.  HENT:  Head: Normocephalic and atraumatic.  Right Ear: External ear normal.  Left Ear: External ear normal.  Eyes: Conjunctivae are normal. Right eye exhibits no discharge. Left eye exhibits no discharge. No scleral icterus.  Pulmonary/Chest: Effort normal. No respiratory distress.  Musculoskeletal:  Cervical Spine: Appearance normal. No obvious bony deformity. No skin swelling, erythema, heat, fluctuance or break of the skin. No TTP over the cervical spinous processes. No paraspinal tenderness. No step-offs. Patient is able to actively rotate their neck 45 degrees left and right voluntarily without pain and flex and extend the neck without pain.  Negative Spurling's test.  Negative cervical load test.  Right Shoulder: Appearance normal. No obvious bony deformity. No skin swelling, erythema, heat, fluctuance or break of the skin. No clavicular deformity or TTP. TTP over anterior, lateral and posterior shoulder.  Patient's active abduction limited to 90 degrees secondary to pain.  He has intact passive abduction 170 degrees. Patient with intact active and passive flexion, extension, adduction, and internal/external rotation.  Patient notes pain with internal and external rotation.  There is minimal crepitus of the shoulder with these motions. Strength for flexion, extension, abduction, adduction, and internal/external rotation intact and appropriate for age.  Negative liftoff test.  Negative drop arm test.  Negative Adson's test.  Right Elbow: Appearance normal. No  obvious bony deformity. No skin swelling, erythema, heat, fluctuance or break of the skin. No TTP over joint. Active flexion, extension, supination and pronation full and intact without pain. Strength able and appropriate for age for flexion and extension.  Radial Pulse 2+. Cap refill <2 seconds. SILT for M/U/R distributions. Compartments soft.    Neurological: He is alert.  Skin: Skin is warm and dry. Capillary refill takes less than 2 seconds. No pallor.  Psychiatric: He has a normal mood and affect.  Nursing note and vitals reviewed.    ED Treatments / Results  Labs (all labs ordered are listed, but only abnormal results are displayed) Labs Reviewed - No data to display  EKG  EKG Interpretation None       Radiology No results found.  Procedures Procedures (including critical care time)  Medications Ordered in ED Medications - No data to display   Initial Impression / Assessment and Plan / ED Course  I have reviewed the triage vital signs and the nursing notes.  Pertinent labs & imaging results that were available during my care of the patient were reviewed by me and considered in my medical decision making (see chart for details).     51 year old male with atraumatic right shoulder pain after repetitive lifting and throwing motions yesterday.  He has pain with motion and with lying on that shoulder.  Suspect likely rotator cuff pathology, likely tendinitis.  Patient denied pain medication in the department.  Will provide prescription for NSAIDs and muscle relaxer.  Will provide shoulder sling.  Advised patient to follow-up rice therapy.  Will advise the patient to remove arm from shoulder sling multiple times per day in order to prevent frozen shoulder.  I will provide the patient with shoulder range of motion exercises.  He has a orthopedist already and has an appointment on the 19th.  Strict return precautions discussed.  Patient appears safe for discharge.  Final  Clinical Impressions(s) / ED Diagnoses   Final diagnoses:  Acute pain of right shoulder    ED Discharge Orders        Ordered    omeprazole (PRILOSEC) 20 MG capsule  Daily     04/23/17 1013    naproxen (NAPROSYN) 500 MG tablet  2 times daily     04/23/17 1013    cyclobenzaprine (FLEXERIL) 10 MG tablet  At bedtime PRN     04/23/17 1013       Princella PellegriniMaczis, Calene Paradiso M, PA-C 04/23/17 1325    Azalia Bilisampos, Kevin, MD 04/24/17 [phone]

## 2017-04-23 NOTE — ED Notes (Signed)
Bed: WTR7 Expected date:  Expected time:  Means of arrival:  Comments: 

## 2017-04-23 NOTE — Discharge Instructions (Signed)
Please read and follow all provided instructions.  You have been seen today for Right Shoulder Pain  Home care instructions: -- *PRICE in the first 24-48 hours after injury: Protect (with brace, splint, sling), if given by your provider -take your arm out of the sling multiple times per day and perform shoulder range of motion exercises.  Is important to perform these exercises in order to prevent frozen shoulder. Rest Ice- Do not apply ice pack directly to your skin, place towel or similar between your skin and ice/ice pack. Apply ice for 20 min, then remove for 40 min while awake Compression- Wear brace, elastic bandage, splint as directed by your provider Elevate affected extremity above the level of your heart when not walking around for the first 24-48 hours   Use naproxen as directed for pain with food.   Use muscle relaxers as needed at nighttime.  They can cause drowsiness.  Please do not operate heavy machinery or drive while using these medications.  Follow-up instructions: Please follow-up with your primary care provider or the provided orthopedic physician (bone specialist) if you continue to have significant pain in 1 week. In this case you may have a more severe injury that requires further care.   Return instructions:  Please return if your toes or feet are numb or tingling, appear gray or blue, or you have severe pain (also elevate the leg and loosen splint or wrap if you were given one) Please return to the Emergency Department if you experience worsening symptoms.  Please return if you have any other emergent concerns. Additional Information:  Your vital signs today were: BP (!) 175/93 (BP Location: Left Arm)    Pulse 88    Temp 98 F (36.7 C) (Oral)    Resp 16    Ht 5\' 10"  (1.778 m)    SpO2 98%    BMI 37.74 kg/m  If your blood pressure (BP) was elevated above 135/85 this visit, please have this repeated by your doctor within one month. ---------------

## 2017-04-23 NOTE — ED Triage Notes (Signed)
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

## 2017-04-25 ENCOUNTER — Other Ambulatory Visit (INDEPENDENT_AMBULATORY_CARE_PROVIDER_SITE_OTHER): Payer: Self-pay

## 2017-04-25 ENCOUNTER — Telehealth (INDEPENDENT_AMBULATORY_CARE_PROVIDER_SITE_OTHER): Payer: Self-pay | Admitting: Radiology

## 2017-04-25 MED ORDER — ACETAMINOPHEN-CODEINE #3 300-30 MG PO TABS: 1.0000 | ORAL_TABLET | Freq: Four times a day (QID) | ORAL | 0 refills | Status: DC | PRN

## 2017-04-25 NOTE — Telephone Encounter (Signed)
Please advise 

## 2017-04-25 NOTE — Telephone Encounter (Signed)
Called into pharmacy

## 2017-04-25 NOTE — Telephone Encounter (Signed)
Patient requests refill on Tylenol #3 at [facility] on [address]

## 2017-04-25 NOTE — Telephone Encounter (Signed)
I am okay with him getting some Tylenol No. 3.  1-2 every 8-12 hours as needed, #60 with no refills.

## 2017-04-29 ENCOUNTER — Telehealth (INDEPENDENT_AMBULATORY_CARE_PROVIDER_SITE_OTHER): Payer: Self-pay | Admitting: Orthopedic Surgery

## 2017-04-29 NOTE — Telephone Encounter (Signed)
IC discussed.  

## 2017-04-29 NOTE — Telephone Encounter (Signed)
Please advise. Thanks.  

## 2017-04-29 NOTE — Telephone Encounter (Signed)
Patient called needing Rx refilled (Tramadol) The number to contact patient is [phone]

## 2017-04-29 NOTE — Telephone Encounter (Signed)
Wait till Monday

## 2017-05-02 ENCOUNTER — Encounter (INDEPENDENT_AMBULATORY_CARE_PROVIDER_SITE_OTHER): Payer: Self-pay | Admitting: Orthopedic Surgery

## 2017-05-02 ENCOUNTER — Ambulatory Visit (INDEPENDENT_AMBULATORY_CARE_PROVIDER_SITE_OTHER): Payer: Self-pay | Admitting: Orthopedic Surgery

## 2017-05-02 DIAGNOSIS — G8929 Other chronic pain: Secondary | ICD-10-CM

## 2017-05-02 DIAGNOSIS — M25562 Pain in left knee: Secondary | ICD-10-CM

## 2017-05-02 NOTE — Progress Notes (Signed)
Office Visit Note   Patient: Mike Watkins           Date of Birth: 03/11/1966           MRN: [id number] Visit Date: 05/02/2017 Requested by: Mike Moccasinewey, Elizabeth R, MD [address] [place], [place] [id number] PCP: Mike Moccasinewey, Elizabeth R, MD  Subjective: Chief Complaint  Patient presents with  . Right Knee - Follow-up  . Left Knee - Follow-up    HPI: Mike Watkins is a 51 year old patient with bilateral knee pain left worse than right.  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  This does involve a lot of strength in that left leg.  A narcotic check is counseled today as to the need to either use tramadol or Tylenol 3.  Ideally we would be tapering him off both medications.  I'm not sure if that will be possible looking at the amount of medication he's been on.  Nonetheless I think he needs to be on one or the other.  Also told him that we don't really do long-term pain medicine as a rule for treatment of these musculoskeletal problems outside of surgery.  His radiographs do show some medial joint space narrowing but no bone-on-bone arthritis.  He has not had CT arthrogram or MRI scan of the knee.              ROS: All systems reviewed are negative as they relate to the chief complaint within the history of present illness.  Patient denies  fevers or chills.   Assessment & Plan: Visit Diagnoses:  1. Chronic pain of left knee     Plan: Impression is left knee pain.  Plan MRI left knee to evaluate for potential operative pathology.  Talked with him at length about his medication use.  We will hold on the Tylenol 3 and instead use all TRAM 1 a day to a maximum of 2 per day.  That prescription is written for 12 1 which will be 6 weeks after his last terminal prescription.  Hold off on further Tylenol 3 refills and that is discussed with him clearly.  I'll see him back in a month after his MRI scan.  Follow-Up Instructions: Return in about 4 weeks (around 05/30/2017).   Orders:    Orders Placed This Encounter  Procedures  . MR Knee Left w/o contrast   No orders of the defined types were placed in this encounter.     Procedures: No procedures performed   Clinical Data: No additional findings.  Objective: Vital Signs: There were no vitals taken for this visit.  Physical Exam:   Constitutional: Patient appears well-developed HEENT:  Head: Normocephalic Eyes:EOM are normal Neck: Normal range of motion Cardiovascular: Normal rate Pulmonary/chest: Effort normal Neurologic: Patient is alert Skin: Skin is warm Psychiatric: Patient has normal mood and affect    Ortho Exam: Orthopedic exam demonstrates full active and passive range of motion of both knees with no effusion.  Pedal pulses palpable.  Medial greater than lateral joint line tenderness on that left knee.  Extensor mechanism is intact.  Mild tissue crepitus with range of motion.  This is primarily in the patellofemoral joint  Specialty Comments:  No specialty comments available.  Imaging: No results found.   PMFS History: Patient Active Problem List   Diagnosis Date Noted  . Chronic pain of left knee 02/07/2017  . Fall -multiple falls in the past year. 08/29/2016  . Acute  pain of right knee 08/29/2016  . Contusion of right hand 07/14/2016  . Ganglion of flexor tendon sheath of right middle finger 06/02/2016  . Impingement syndrome of left shoulder 04/20/2016   Past Medical History:  Diagnosis Date  . Arthritis   . Hypertension   . Penile lesion     Family History  Problem Relation Age of Onset  . Hypertension Mother   . Diabetes Mother   . Hypertension Father   . Cancer Father     Past Surgical History:  Procedure Laterality Date  . ANTERIOR CERVICAL DECOMP/DISCECTOMY FUSION  12-31-2002   C5 -- C7  . CYST EXCISION     pain in Watkins/hand-middle finger  . EXCISIONAL BIOPSY OF PENILE LESION N/A 08/23/2012   [facility] Milford CageWoodruff[facility] Young[facility]   . HAND SURGERY    . POSTERIOR FUSION CERVICAL SPINE  02-26-2005   C5 -- C7   Social History   Occupational History  . Not on file  Tobacco Use  . Smoking status: Former Smoker    Packs/day: 3.00    Years: 21.00    Pack years: 63.00    Types: Cigarettes    Last attempt to quit: 08/18/2002    Years since quitting: 14.7  . Smokeless tobacco: Former Engineer, waterUser  Substance and Sexual Activity  . Alcohol use: Yes    Comment: RARE  . Drug use: No  . Sexual activity: Not on file

## 2017-05-12 ENCOUNTER — Telehealth (INDEPENDENT_AMBULATORY_CARE_PROVIDER_SITE_OTHER): Payer: Self-pay | Admitting: Orthopaedic Surgery

## 2017-05-12 NOTE — Telephone Encounter (Signed)
Patient called asking if his Tylenol 3's have been called in yet? CB # [phone]

## 2017-05-12 NOTE — Telephone Encounter (Signed)
So I thought we addressed this with him last time that he either needs to take either Ultram for the Tylenol 3 and then start weaning himself off of it is that correct

## 2017-05-12 NOTE — Telephone Encounter (Signed)
Patient would like refill of tylenol #3 (FYI--Just had filled 04/25/17 #60)

## 2017-05-12 NOTE — Telephone Encounter (Signed)
Too early to refill.  Also, he has been seeing Dr. August Saucerean most recently, so will defer further treatment and meds to him.

## 2017-05-12 NOTE — Telephone Encounter (Signed)
See below

## 2017-05-13 NOTE — Telephone Encounter (Signed)
IC s/w patient and advised. He stated he was taking T#3 for his shoulder. I explained that you do not take a different medication for pain for different problem. Advised should help with all areas involved. Again explained should be weaning off. Patient verbalized understanding.

## 2017-05-13 NOTE — Telephone Encounter (Signed)
thx

## 2017-05-21 ENCOUNTER — Other Ambulatory Visit: Payer: Self-pay

## 2017-05-30 ENCOUNTER — Telehealth (INDEPENDENT_AMBULATORY_CARE_PROVIDER_SITE_OTHER): Payer: Self-pay

## 2017-05-30 NOTE — Telephone Encounter (Signed)
IC s/w patient and advised he needed to call [facility] and reschedule appointment for scan. Provided phone number for patient.

## 2017-05-30 NOTE — Telephone Encounter (Signed)
It was scheduled for 12/08 at 4pm but patient no showed appt for scan.

## 2017-05-30 NOTE — Telephone Encounter (Signed)
Patient called stated that he has had increasing problems with his left knee. Wanted to know if you have any suggestions. Please advise. Thanks.

## 2017-05-30 NOTE — Telephone Encounter (Signed)
Mri ordered when is that

## 2017-05-30 NOTE — Telephone Encounter (Signed)
Nothing else to do until scan thx

## 2017-06-01 ENCOUNTER — Telehealth (INDEPENDENT_AMBULATORY_CARE_PROVIDER_SITE_OTHER): Payer: Self-pay | Admitting: Orthopedic Surgery

## 2017-06-01 MED ORDER — TRAMADOL HCL 50 MG PO TABS: ORAL_TABLET | ORAL | 0 refills | Status: DC

## 2017-06-01 NOTE — Telephone Encounter (Signed)
rx called to pharmacy. Patient advised done.  

## 2017-06-01 NOTE — Telephone Encounter (Signed)
Patient called needing RX refilled (Tramadol) Patient also wanted Dr August Saucerean to know that his MRI is scheduled for 06/24/16. The number to contact patient is [phone]

## 2017-06-01 NOTE — Telephone Encounter (Signed)
Ok for tram 1 po q d # 30 pls clal htx

## 2017-06-01 NOTE — Telephone Encounter (Signed)
Please advise. Ok to rf? Last got T#3 on 11/12 #60 and ultram back in October.

## 2017-06-24 ENCOUNTER — Other Ambulatory Visit: Payer: Self-pay

## 2017-06-30 ENCOUNTER — Telehealth (INDEPENDENT_AMBULATORY_CARE_PROVIDER_SITE_OTHER): Payer: Self-pay | Admitting: Radiology

## 2017-06-30 NOTE — Telephone Encounter (Signed)
IC s/w patient and advised  

## 2017-06-30 NOTE — Telephone Encounter (Signed)
Patient had to cancel the post MRI appt, he is asking for a refill of the tramadol.  He is out of town for work and will be coming in town, then has to leave again for work.  Last tramadol #30 given on 06/01/17, please call him to advise.

## 2017-06-30 NOTE — Telephone Encounter (Signed)
Ok to rf 1/28 pls calla htx

## 2017-06-30 NOTE — Telephone Encounter (Signed)
Please advise. Thanks.  

## 2017-07-01 ENCOUNTER — Ambulatory Visit (INDEPENDENT_AMBULATORY_CARE_PROVIDER_SITE_OTHER): Payer: Self-pay | Admitting: Orthopedic Surgery

## 2017-07-06 ENCOUNTER — Telehealth (INDEPENDENT_AMBULATORY_CARE_PROVIDER_SITE_OTHER): Payer: Self-pay | Admitting: Orthopedic Surgery

## 2017-07-06 NOTE — Telephone Encounter (Signed)
Patient called [facility] and asked if he could get the tramadol Rx a few days early, he is leaving to go out of town x 2 months, he wants to know if he can get a 2 months supply of it.  Please call him to advise. [phone]

## 2017-07-06 NOTE — Telephone Encounter (Signed)
Patient called requesting a call back from you. CB # [phone]

## 2017-07-06 NOTE — Telephone Encounter (Signed)
Please advise. Thanks.  

## 2017-07-07 MED ORDER — TRAMADOL HCL 50 MG PO TABS: ORAL_TABLET | ORAL | 0 refills | Status: DC

## 2017-07-07 NOTE — Telephone Encounter (Signed)
Ok for 30  for 30 days pls clal thx

## 2017-07-07 NOTE — Telephone Encounter (Signed)
Called to pharmacy. Patient advised done.  

## 2017-08-04 ENCOUNTER — Telehealth (INDEPENDENT_AMBULATORY_CARE_PROVIDER_SITE_OTHER): Payer: Self-pay | Admitting: Orthopedic Surgery

## 2017-08-04 NOTE — Telephone Encounter (Signed)
Patient is requesting RX refill on Tramadol, wants his wife to come pick it up tomorrow before she visits him out of town so she can take it to him. Please advise # [phone]

## 2017-08-04 NOTE — Telephone Encounter (Signed)
Please advise. Thanks.  

## 2017-08-05 MED ORDER — TRAMADOL HCL 50 MG PO TABS: ORAL_TABLET | ORAL | 0 refills | Status: DC

## 2017-08-05 NOTE — Telephone Encounter (Signed)
Please advise. Thanks.  

## 2017-08-05 NOTE — Telephone Encounter (Signed)
One more then try primary care doctor

## 2017-08-05 NOTE — Telephone Encounter (Signed)
IC advised could pick up at front desk.  

## 2017-08-05 NOTE — Telephone Encounter (Signed)
Patient called wanting to know if Dr. August Saucerean would fill his prescription today.  He stated that he is out of town right now and that his wife will be leaving around noon today to come down where he is and is wanting her to pick up his prescription.  Thank you.

## 2017-08-19 ENCOUNTER — Encounter (INDEPENDENT_AMBULATORY_CARE_PROVIDER_SITE_OTHER): Payer: Self-pay | Admitting: Orthopedic Surgery

## 2017-08-19 ENCOUNTER — Ambulatory Visit (INDEPENDENT_AMBULATORY_CARE_PROVIDER_SITE_OTHER): Payer: Self-pay | Admitting: Orthopedic Surgery

## 2017-08-19 ENCOUNTER — Ambulatory Visit (INDEPENDENT_AMBULATORY_CARE_PROVIDER_SITE_OTHER): Payer: Self-pay

## 2017-08-19 DIAGNOSIS — M25562 Pain in left knee: Secondary | ICD-10-CM

## 2017-08-19 DIAGNOSIS — M25551 Pain in right hip: Secondary | ICD-10-CM

## 2017-08-19 NOTE — Progress Notes (Signed)
Office Visit Note   Patient: Mike Watkins           Date of Birth: 07/17/1965           MRN: [id number] Visit Date: 08/19/2017 Requested by: [name], MD [address] [place], [place] [address] PCP: [name], MD  Subjective: Chief Complaint  Patient presents with  . Right Hip - Pain  . Left Knee - Pain    HPI: Mike Watkins is a 52 year old patient with left knee pain.  He fell while he was at work 2 weeks ago.  He actually had an impact injury to the left knee when he fell on a chain.  He also reports some right hip and buttock pain with some radicular leg pain on the right-hand side.  Is hard for him to stand very long time without having pain.  Has had previous cervical spine fusion in 2006.  He takes tramadol for pain.  I reviewed his tramadol history use.  He has had some combination of tramadol primarily and occasional Tylenol 3 for about the past 2 years.  Talked to him today that we cannot really do long-term pain management.  The amount he is taking is very small.  Last prescription I gave him was about a month ago for 1 tablet a day as needed for pain.  I do think that he needs to obtain a primary care provider they can address his chronic long-term pain medic's and issues.  I conveyed that to him today in clear terms.              ROS: All systems reviewed are negative as they relate to the chief complaint within the history of present illness.  Patient denies  fevers or chills.   Assessment & Plan: Visit Diagnoses:  1. Left knee pain, unspecified chronicity   2. Pain in right hip     Plan: Impression is left knee contusion and right sided radicular type pain likely from facet arthritis.  We have ordered an MRI scan on the left knee before which was not done per patient request.  He states his insurance is changing and it is too expensive for him to do that.  I think the same could be done for his back in terms of MRI scanning and injections.  For  now we will fill his tramadol prescription on the 22nd.  I put 2 refills on but in that time.  He needs to find a primary care provider who can deal with his pain medicine requirements.  Alternatively he could just transition to Tylenol and ibuprofen.  At this time he does not have a surgical problem that we can either discover or deal with based on absence of further imaging.  Follow-up with me as needed  Follow-Up Instructions: Return if symptoms worsen or fail to improve.   Orders:  Orders Placed This Encounter  Procedures  . XR KNEE 3 VIEW LEFT  . XR Lumbar Spine 2-3 Views  . XR Pelvis 1-2 Views   No orders of the defined types were placed in this encounter.     Procedures: No procedures performed   Clinical Data: No additional findings.  Objective: Vital Signs: There were no vitals taken for this visit.  Physical Exam:   Constitutional: Patient appears well-developed HEENT:  Head: Normocephalic Eyes:EOM are normal Neck: Normal range of motion Cardiovascular: Normal rate Pulmonary/chest: Effort normal Neurologic: Patient is alert Skin: Skin is warm  Psychiatric: Patient has normal mood and affect    Ortho Exam: Orthopedic exam demonstrates increased body mass index since I have seen him.  He has palpable pedal pulses.  No effusion in either knee.  Collateral cruciate ligaments are stable.  Fairly mild proximal medial tenderness on the tibia.  He is got nerve root tension signs on the right but not on the left.  Good ankle dorsi flexion plantar flexion quad hamstring strength is symmetric reflexes.  Specialty Comments:  No specialty comments available.  Imaging: Xr Knee 3 View Left  Result Date: 08/19/2017 AP lateral merchant left knee reviewed.  Medial compartment arthritis is present which is mild.  There is no fracture or dislocation.  Patella well centered in the trochlear groove.  No effusion.  Xr Lumbar Spine 2-3 Views  Result Date: 08/19/2017 AP lateral  lumbar spine reviewed.  Patient has significant L5-S1 degenerative disc disease along with significant facet arthritis at this level.  No compression fracture or spondylolisthesis.  Xr Pelvis 1-2 Views  Result Date: 08/19/2017 AP pelvis demonstrates mild to moderate hip arthritis bilaterally.  No fracture or dislocation is present.  Remainder bony pelvis normal.    PMFS History: Patient Active Problem List   Diagnosis Date Noted  . Chronic pain of left knee 02/07/2017  . Fall -multiple falls in the past year. 08/29/2016  . Acute pain of right knee 08/29/2016  . Contusion of right hand 07/14/2016  . Ganglion of flexor tendon sheath of right middle finger 06/02/2016  . Impingement syndrome of left shoulder 04/20/2016   Past Medical History:  Diagnosis Date  . Arthritis   . Hypertension   . Penile lesion     Family History  Problem Relation Age of Onset  . Hypertension Mother   . Diabetes Mother   . Hypertension Father   . Cancer Father     Past Surgical History:  Procedure Laterality Date  . ANTERIOR CERVICAL DECOMP/DISCECTOMY FUSION  12-31-2002   C5 -- C7  . CYST EXCISION     pain in r/hand-middle finger  . HAND SURGERY    . PENILE BIOPSY N/A 08/23/2012   Procedure: EXCISIONAL BIOPSY OF PENILE LESION;  Surgeon: [name], MD;  [facility] ;  Service: Urology;  Laterality: N/A;  EXCISIONAL BIOPSY OF PENILE LESION    . POSTERIOR FUSION CERVICAL SPINE  02-26-2005   C5 -- C7   Social History   Occupational History  . Not on file  Tobacco Use  . Smoking status: Former Smoker    Packs/day: 3.00    Years: 21.00    Pack years: 63.00    Types: Cigarettes    Last attempt to quit: 08/18/2002    Years since quitting: 15.0  . Smokeless tobacco: Former Engineer, waterUser  Substance and Sexual Activity  . Alcohol use: Yes    Comment: RARE  . Drug use: No  . Sexual activity: Not on file

## 2017-10-12 ENCOUNTER — Telehealth (INDEPENDENT_AMBULATORY_CARE_PROVIDER_SITE_OTHER): Payer: Self-pay | Admitting: Orthopedic Surgery

## 2017-10-12 NOTE — Telephone Encounter (Signed)
n

## 2017-10-12 NOTE — Telephone Encounter (Signed)
Ok to refill? Patient just had a refill by Dr Corliss Skains on 04/19 #30 since his last refill from you. Please advise. Thanks.

## 2017-10-12 NOTE — Telephone Encounter (Signed)
Patient called stating that he twisted his knee on Monday and has been taking 2 tramadol instead of just one and is not able to refill the prescription until the 20th.  He wanted to know if Dr. August Saucer will call in Tramadol for him.  ZO#[phone].  Thank you.

## 2017-10-12 NOTE — Telephone Encounter (Signed)
IC s/w patient and advised  

## 2017-10-14 ENCOUNTER — Emergency Department (HOSPITAL_COMMUNITY): Payer: BLUE CROSS/BLUE SHIELD

## 2017-10-14 ENCOUNTER — Encounter (HOSPITAL_COMMUNITY): Payer: Self-pay

## 2017-10-14 ENCOUNTER — Other Ambulatory Visit: Payer: Self-pay

## 2017-10-14 ENCOUNTER — Emergency Department (HOSPITAL_COMMUNITY)
Admission: EM | Admit: 2017-10-14 | Discharge: 2017-10-14 | Disposition: A | Discharge status: Home / Self Care | Payer: BLUE CROSS/BLUE SHIELD | Attending: Emergency Medicine | Admitting: Emergency Medicine

## 2017-10-14 DIAGNOSIS — G8929 Other chronic pain: Secondary | ICD-10-CM | POA: Insufficient documentation

## 2017-10-14 DIAGNOSIS — Y999 Unspecified external cause status: Secondary | ICD-10-CM | POA: Insufficient documentation

## 2017-10-14 DIAGNOSIS — I10 Essential (primary) hypertension: Secondary | ICD-10-CM | POA: Insufficient documentation

## 2017-10-14 DIAGNOSIS — Z87891 Personal history of nicotine dependence: Secondary | ICD-10-CM | POA: Diagnosis not present

## 2017-10-14 DIAGNOSIS — M25562 Pain in left knee: Secondary | ICD-10-CM | POA: Insufficient documentation

## 2017-10-14 DIAGNOSIS — Y939 Activity, unspecified: Secondary | ICD-10-CM | POA: Diagnosis not present

## 2017-10-14 DIAGNOSIS — X500XXA Overexertion from strenuous movement or load, initial encounter: Secondary | ICD-10-CM | POA: Insufficient documentation

## 2017-10-14 DIAGNOSIS — M25511 Pain in right shoulder: Secondary | ICD-10-CM

## 2017-10-14 DIAGNOSIS — Y929 Unspecified place or not applicable: Secondary | ICD-10-CM | POA: Insufficient documentation

## 2017-10-14 DIAGNOSIS — S4991XA Unspecified injury of right shoulder and upper arm, initial encounter: Secondary | ICD-10-CM | POA: Diagnosis present

## 2017-10-14 MED ORDER — TRAMADOL HCL 50 MG PO TABS: 50.0000 mg | ORAL_TABLET | Freq: Four times a day (QID) | ORAL | 0 refills | Status: DC | PRN

## 2017-10-14 MED ORDER — IBUPROFEN 200 MG PO TABS
600.0000 mg | ORAL_TABLET | Freq: Once | ORAL | Status: AC
  Administered 2017-10-14: 600 mg via ORAL
  Filled 2017-10-14: qty 3

## 2017-10-14 NOTE — ED Triage Notes (Signed)
Patient c/o right shoulder pain after his left knee gave way and he grabbed onto his truck to keep from falling. Patient states he has pain in his right shoulder.

## 2017-10-14 NOTE — Discharge Instructions (Addendum)
Take tramadol every 6 hours as needed for pain.  You can alternate with Tylenol.  Use ice 3-4 times daily alternating 20 minutes on, 20 minutes off.  Wear your sling most of the day, but take your shoulder out 2-3 times daily and attempt the range of motion exercises.  Please follow-up with Dr. August Saucer for further evaluation and treatment of your shoulder injury.  Please return to the emergency department if you develop any new or worsening symptoms.

## 2017-10-14 NOTE — ED Provider Notes (Signed)
[facility]-EMERGENCY DEPT Provider Note   CSN: [id number] Arrival date & time: 10/14/17  1656     History   Chief Complaint Chief Complaint  Patient presents with  . Shoulder Pain    HPI Mike Watkins is a 52 y.o. male with history of hypertension and chronic left knee pain who presents with right shoulder pain.  Patient reports he was getting in his truck yesterday and pulling up with his right hand when his left knee gave out.  He reports his shoulder was pulled by his own weight.  He has had pain in the front of his shoulder since.  He took his wife's Percocet last night with some relief.  He has pain with flexion, internal, and external rotation.  He denies any numbness or tingling.  He denies any chest pain, shortness of breath, or radiation down his arm.  HPI  Past Medical History:  Diagnosis Date  . Arthritis   . Hypertension   . Penile lesion     Patient Active Problem List   Diagnosis Date Noted  . Chronic pain of left knee 02/07/2017  . Fall -multiple falls in the past year. 08/29/2016  . Acute pain of right knee 08/29/2016  . Contusion of right hand 07/14/2016  . Ganglion of flexor tendon sheath of right middle finger 06/02/2016  . Impingement syndrome of left shoulder 04/20/2016    Past Surgical History:  Procedure Laterality Date  . ANTERIOR CERVICAL DECOMP/DISCECTOMY FUSION  12-31-2002   C5 -- C7  . CYST EXCISION     pain in r/hand-middle finger  . HAND SURGERY    . PENILE BIOPSY N/A 08/23/2012   Procedure: EXCISIONAL BIOPSY OF PENILE LESION;  Surgeon: Milford Cage, MD;  Location: [facility];  Service: Urology;  Laterality: N/A;  EXCISIONAL BIOPSY OF PENILE LESION    . POSTERIOR FUSION CERVICAL SPINE  02-26-2005   C5 -- C7        Home Medications    Prior to Admission medications   Medication Sig Start Date End Date Taking? Authorizing Provider  albuterol (PROVENTIL HFA;VENTOLIN HFA) 108 (90 Base)  MCG/ACT inhaler Inhale 1-2 puffs into the lungs every 6 (six) hours as needed for wheezing or shortness of breath. 09/26/16  Yes Doreene Eland, MD  losartan-hydrochlorothiazide (HYZAAR) 50-12.5 MG tablet Take 1 tablet by mouth daily.  06/16/16  Yes [provider]  omeprazole (PRILOSEC) 20 MG capsule Take 1 capsule (20 mg total) daily by mouth. 04/23/17  Yes Maczis, Elmer Sow, PA-C  acetaminophen-codeine (TYLENOL #3) 300-30 MG tablet Take 1-2 tablets every 6 (six) hours as needed by mouth for moderate pain. Patient not taking: Reported on 10/14/2017 04/25/17   Kathryne Hitch, MD  clindamycin (CLEOCIN) 150 MG capsule Take 2 capsules (300 mg total) by mouth 4 (four) times daily. Patient not taking: Reported on 10/14/2017 11/20/16   Ward, Chase Picket, PA-C  cyclobenzaprine (FLEXERIL) 10 MG tablet Take 1 tablet (10 mg total) at bedtime as needed by mouth for muscle spasms. Patient not taking: Reported on 10/14/2017 04/23/17   Maczis, Elmer Sow, PA-C  Diclofenac Sodium (PENNSAID) 2 % SOLN Place 1 application onto the skin 2 (two) times daily. Patient not taking: Reported on 10/14/2017 08/25/16   [name], DO  methylPREDNISolone (MEDROL) 4 MG tablet Medrol dose pack. Take as instructed Patient not taking: Reported on 10/14/2017 01/10/17   Kathryne Hitch, MD  naproxen (NAPROSYN) 500 MG tablet Take 1 tablet (  500 mg total) 2 (two) times daily by mouth. Patient not taking: Reported on 10/14/2017 04/23/17   Maczis, Elmer Sow, PA-C  predniSONE (DELTASONE) 10 MG tablet 6 tablets for 2 days, then 5 for 2 days, then 4 for 2 days, then 3  for 2 days, then 2 for 2 days, then 1 tablet for 2 days Patient not taking: Reported on 10/14/2017 10/21/16   Adonis Huguenin, NP  traMADol (ULTRAM) 50 MG tablet Take 1 tablet (50 mg total) by mouth every 6 (six) hours as needed. 10/14/17   Emi Holes, PA-C    Family History Family History  Problem Relation Age of Onset  . Hypertension Mother   .  Diabetes Mother   . Hypertension Father   . Cancer Father     Social History Social History   Tobacco Use  . Smoking status: Former Smoker    Packs/day: 3.00    Years: 21.00    Pack years: 63.00    Types: Cigarettes    Last attempt to quit: 08/18/2002    Years since quitting: 15.1  . Smokeless tobacco: Former Engineer, water Use Topics  . Alcohol use: Yes    Comment: RARE  . Drug use: No     Allergies   Penicillins and Sulfa antibiotics   Review of Systems Review of Systems  Musculoskeletal: Positive for arthralgias. Negative for back pain and neck pain.  Neurological: Negative for numbness.     Physical Exam Updated Vital Signs BP (!) 157/91 (BP Location: Left Arm)   Pulse 99   Temp 98.4 F (36.9 C) (Oral)   Resp 16   Ht  (1.753 m)   Wt 128.4 kg (283 lb)   SpO2 96%   BMI 41.79 kg/m   Physical Exam  Constitutional: He appears well-developed and well-nourished. No distress.  HENT:  Head: Normocephalic and atraumatic.  Mouth/Throat: Oropharynx is clear and moist. No oropharyngeal exudate.  Eyes: Pupils are equal, round, and reactive to light. Conjunctivae are normal. Right eye exhibits no discharge. Left eye exhibits no discharge. No scleral icterus.  Neck: Normal range of motion. Neck supple. No thyromegaly present.  Cardiovascular: Normal rate, regular rhythm, normal heart sounds and intact distal pulses. Exam reveals no gallop and no friction rub.  No murmur heard. Pulmonary/Chest: Effort normal and breath sounds normal. No stridor. No respiratory distress. He has no wheezes. He has no rales.  Abdominal: Soft. Bowel sounds are normal. He exhibits no distension. There is no tenderness. There is no rebound and no guarding.  Musculoskeletal: He exhibits no edema.  Tenderness over the R AC joint, 5/5 strength to the R shoulder; pain with internal rotation, worse on external rotation and flexion, pain with empty can test No midline tenderness to the  cervical, thoracic, or lumbar spine or remainder of the RUE Sensation intact to the distal right forearm, radial pulses intact  Lymphadenopathy:    He has no cervical adenopathy.  Neurological: He is alert. Coordination normal.  Skin: Skin is warm and dry. No rash noted. He is not diaphoretic. No pallor.  Psychiatric: He has a normal mood and affect.  Nursing note and vitals reviewed.    ED Treatments / Results  Labs (all labs ordered are listed, but only abnormal results are displayed) Labs Reviewed - No data to display  EKG None  Radiology Dg Shoulder Right  Result Date: 10/14/2017 CLINICAL DATA:  Right shoulder injury EXAM: RIGHT SHOULDER - 2+ VIEW COMPARISON:  None. FINDINGS:  No fracture or dislocation is seen. The joint spaces are preserved. Visualized soft tissues are within normal limits. Visualized right lung is clear. IMPRESSION: Negative. Electronically Signed   By: Charline Bills M.D.   On: 10/14/2017 17:44    Procedures Procedures (including critical care time)  Medications Ordered in ED Medications  ibuprofen (ADVIL,MOTRIN) tablet 600 mg (600 mg Oral Given 10/14/17 1730)     Initial Impression / Assessment and Plan / ED Course  I have reviewed the triage vital signs and the nursing notes.  Pertinent labs & imaging results that were available during my care of the patient were reviewed by me and considered in my medical decision making (see chart for details).     Patient with suspected AC injury.  Right shoulder x-ray is negative.  Patient advised to wear sling that he has at home.  Supportive treatment is ice and range of motion exercises.  Will provide very short course of tramadol.  Patient is out of his tramadol that he takes for his knee and did have a new prescription filled on 09/30/17, however per chart review he had been taking 2 because he twisted his knee.  His orthopedic doctor did not refill.  Considering new injury, will write for 8 tablets.  He is  advised alternate with Tylenol.  Patient advised to follow-up with his orthopedic doctor.  Return precautions discussed.  Patient understands and agrees with plan.  Patient vitals stable throughout ED course and discharged in satisfactory condition.  Final Clinical Impressions(s) / ED Diagnoses   Final diagnoses:  Acute pain of right shoulder    ED Discharge Orders        Ordered    traMADol (ULTRAM) 50 MG tablet  Every 6 hours PRN     10/14/17 1803       Emi Holes, PA-C 10/14/17 1808    Rolan Bucco, MD 10/14/17 2209

## 2017-10-20 ENCOUNTER — Encounter (INDEPENDENT_AMBULATORY_CARE_PROVIDER_SITE_OTHER): Payer: Self-pay | Admitting: Orthopedic Surgery

## 2017-10-20 ENCOUNTER — Ambulatory Visit (INDEPENDENT_AMBULATORY_CARE_PROVIDER_SITE_OTHER): Payer: BLUE CROSS/BLUE SHIELD | Admitting: Orthopedic Surgery

## 2017-10-20 DIAGNOSIS — G8929 Other chronic pain: Secondary | ICD-10-CM | POA: Diagnosis not present

## 2017-10-20 DIAGNOSIS — M25562 Pain in left knee: Secondary | ICD-10-CM | POA: Diagnosis not present

## 2017-10-23 ENCOUNTER — Encounter (INDEPENDENT_AMBULATORY_CARE_PROVIDER_SITE_OTHER): Payer: Self-pay | Admitting: Orthopedic Surgery

## 2017-10-23 NOTE — Progress Notes (Signed)
Office Visit Note   Patient: Mike Watkins           Date of Birth: Feb 10, 1966           MRN: [id number] Visit Date: 10/20/2017 Requested by: [name], MD [address] [place], [place] [address] PCP: [name], MD  Subjective: Chief Complaint  Patient presents with  . Left Knee - Pain    HPI: Mike Watkins is a patient with left knee pain.  He reports continued pain in that left knee with weakness giving way and swelling.  The pain will wake him from sleep at night.  At some time during his clinical work-up last year MRI scan of the left knee was recommended to evaluate for possible medial meniscal tear.  That was never done.  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              ROS: All systems reviewed are negative as they relate to the chief complaint within the history of present illness.  Patient denies  fevers or chills.   Assessment & Plan: Visit Diagnoses:  1. Chronic pain of left knee     Plan: Impression is left knee pain with effusion and likely medial meniscal tear with fairly unimpressive degenerative findings on plain radiographs.  Plan is MRI scan to determine whether or not further arthroscopic intervention could be performed or whether or not he needs a more aggressive intervention.  I will see him back after that study.  I also want him to try a hinged knee brace to see if that can help.  Follow-Up Instructions: Return for after MRI.   Orders:  Orders Placed This Encounter  Procedures  . MR Knee Left w/o contrast   No orders of the defined types were placed in this encounter.     Procedures: No procedures performed   Clinical Data: No additional findings.  Objective: Vital Signs: There were no vitals taken for this visit.  Physical Exam:   Constitutional: Patient appears well-developed HEENT:  Head: Normocephalic Eyes:EOM are normal Neck: Normal range of motion Cardiovascular: Normal  rate Pulmonary/chest: Effort normal Neurologic: Patient is alert Skin: Skin is warm Psychiatric: Patient has normal mood and affect    Ortho Exam: Orthopedic exam demonstrates slight varus alignment bilateral lower extremities with palpable pedal pulses.  Extensor mechanism is intact.  No masses lymph adenopathy or skin changes noted in that left knee region.  Mild effusion is present.  Medial joint line tenderness is also present.  McMurray compression testing equivocal for medial compartment pathology negative for lateral ACL intact  Specialty Comments:  No specialty comments available.  Imaging: No results found.   PMFS History: Patient Active Problem List   Diagnosis Date Noted  . Chronic pain of left knee 02/07/2017  . Fall -multiple falls in the past year. 08/29/2016  . Acute pain of right knee 08/29/2016  . Contusion of right hand 07/14/2016  . Ganglion of flexor tendon sheath of right middle finger 06/02/2016  . Impingement syndrome of left shoulder 04/20/2016   Past Medical History:  Diagnosis Date  . Arthritis   . Hypertension   . Penile lesion     Family History  Problem Relation Age of Onset  . Hypertension Mother   . Diabetes Mother   . Hypertension Father   . Cancer Father     Past Surgical History:  Procedure Laterality Date  .  ANTERIOR CERVICAL DECOMP/DISCECTOMY FUSION  12-31-2002   C5 -- C7  . CYST EXCISION     pain in r/hand-middle finger  . HAND SURGERY    . PENILE BIOPSY N/A 08/23/2012   Procedure: EXCISIONAL BIOPSY OF PENILE LESION;  Surgeon: Milford Cage, MD;  Location: [facility];  Service: Urology;  Laterality: N/A;  EXCISIONAL BIOPSY OF PENILE LESION    . POSTERIOR FUSION CERVICAL SPINE  02-26-2005   C5 -- C7   Social History   Occupational History  . Not on file  Tobacco Use  . Smoking status: Former Smoker    Packs/day: 3.00    Years: 21.00    Pack years: 63.00    Types: Cigarettes    Last attempt to  quit: 08/18/2002    Years since quitting: 15.1  . Smokeless tobacco: Former Engineer, water and Sexual Activity  . Alcohol use: Yes    Comment: RARE  . Drug use: No  . Sexual activity: Not on file

## 2017-10-28 ENCOUNTER — Telehealth (INDEPENDENT_AMBULATORY_CARE_PROVIDER_SITE_OTHER): Payer: Self-pay | Admitting: Orthopedic Surgery

## 2017-10-28 NOTE — Telephone Encounter (Signed)
Patient aware noone is here to give this an ok with the pharmacist  He will have to wait until the 19th when they will give it to him

## 2017-10-28 NOTE — Telephone Encounter (Signed)
Tramadol (Ultram)50mg tablet  Pt called couldn't pick up med due to two day rule

## 2017-10-30 ENCOUNTER — Ambulatory Visit
Admission: RE | Admit: 2017-10-30 | Discharge: 2017-10-30 | Disposition: A | Discharge status: Home / Self Care | Payer: BLUE CROSS/BLUE SHIELD | Source: Ambulatory Visit | Attending: Orthopedic Surgery | Admitting: Orthopedic Surgery

## 2017-10-30 DIAGNOSIS — M25562 Pain in left knee: Principal | ICD-10-CM

## 2017-10-30 DIAGNOSIS — G8929 Other chronic pain: Secondary | ICD-10-CM

## 2017-11-09 ENCOUNTER — Ambulatory Visit (INDEPENDENT_AMBULATORY_CARE_PROVIDER_SITE_OTHER): Payer: BLUE CROSS/BLUE SHIELD | Admitting: Orthopedic Surgery

## 2017-11-09 ENCOUNTER — Encounter (INDEPENDENT_AMBULATORY_CARE_PROVIDER_SITE_OTHER): Payer: Self-pay | Admitting: Orthopedic Surgery

## 2017-11-09 DIAGNOSIS — M25562 Pain in left knee: Secondary | ICD-10-CM

## 2017-11-09 MED ORDER — TRAMADOL HCL 50 MG PO TABS: ORAL_TABLET | ORAL | 0 refills | Status: DC

## 2017-11-13 ENCOUNTER — Encounter (INDEPENDENT_AMBULATORY_CARE_PROVIDER_SITE_OTHER): Payer: Self-pay | Admitting: Orthopedic Surgery

## 2017-11-13 NOTE — Progress Notes (Signed)
Office Visit Note   Patient: Mike Watkins           Date of Birth: 13-Jul-1965           MRN: [id number] Visit Date: 11/09/2017 Requested by: Mike Moccasin, MD [address] [place], [place] [address] PCP: Mike Moccasin, MD  Subjective: Chief Complaint  Patient presents with  . Left Knee - Follow-up    HPI: Mike Watkins is a patient with left knee pain.  He has had 6 months of symptoms.  Since I have last seen him he has had an MRI scan of the left knee.  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  He has had multiple injections.  MRI scan does show complex medial meniscal tear.  Not much in the way of severe arthritis              ROS: All systems reviewed are negative as they relate to the chief complaint within the history of present illness.  Patient denies  fevers or chills.   Assessment & Plan: Visit Diagnoses:  1. Left knee pain, unspecified chronicity     Plan: Impression is medial meniscal tear relatively symptomatic in a patient but not terribly symptomatic.  Patient has had multiple injections and conservative treatment.  Did request tramadol prescription today.  Received those tablets for several days ago from Dr. Feliz Watkins for..  Prescription was not filled.  He is going to have to consider surgery for this problem.  Surgery for this problem.  With the work he does he would likely be out of work for at least 4 weeks.  The risk and benefits are discussed including but not limited to infection nerve and vessel damage.  And incomplete pain relief.  Follow-Up Instructions: Return if symptoms worsen or fail to improve.   Orders:  No orders of the defined types were placed in this encounter.  Meds ordered this encounter  Medications  . DISCONTD: traMADol (ULTRAM) 50 MG tablet    Sig: 1 po q d to bid prn pain    Dispense:  45 tablet    Refill:  0      Procedures: No procedures performed   Clinical Data: No additional findings.  Objective: Vital Signs: There were no  vitals taken for this visit.  Physical Exam:   Constitutional: Patient appears well-developed HEENT:  Head: Normocephalic Eyes:EOM are normal Neck: Normal range of motion Cardiovascular: Normal rate Pulmonary/chest: Effort normal Neurologic: Patient is alert Skin: Skin is warm Psychiatric: Patient has normal mood and affect    Ortho Exam: Orthopedic exam demonstrates his body mass index with no left knee effusion.  Medial joint line tenderness is present.  Range of motion is full with no flexion contracture.  Extensor mechanism is intact.  No other masses lymphadenopathy or skin changes noted in that left knee region.  Collateral and cruciate ligaments are stable.  Specialty Comments:  No specialty comments available.  Imaging: No results found.   PMFS History: Patient Active Problem List   Diagnosis Date Noted  . Chronic pain of left knee 02/07/2017  . Fall -multiple falls in the past year. 08/29/2016  . Acute pain of right knee 08/29/2016  . Contusion of right hand 07/14/2016  . Ganglion of flexor tendon sheath of right middle finger 06/02/2016  . Impingement syndrome of left shoulder 04/20/2016   Past Medical History:  Diagnosis Date  . Arthritis   . Hypertension   . Penile lesion  Family History  Problem Relation Age of Onset  . Hypertension Mother   . Diabetes Mother   . Hypertension Father   . Cancer Father     Past Surgical History:  Procedure Laterality Date  . ANTERIOR CERVICAL DECOMP/DISCECTOMY FUSION  12-31-2002   C5 -- C7  . CYST EXCISION     pain in r/hand-middle finger  . HAND SURGERY    . PENILE BIOPSY N/A 08/23/2012   Procedure: EXCISIONAL BIOPSY OF PENILE LESION;  Surgeon: Mike Cageaniel Young Woodruff, MD;  [facility];  Service: Urology;  Laterality: N/A;  EXCISIONAL BIOPSY OF PENILE LESION    . POSTERIOR FUSION CERVICAL SPINE  02-26-2005   C5 -- C7   Social History   Occupational History  . Not on file  Tobacco  Use  . Smoking status: Former Smoker    Packs/day: 3.00    Years: 21.00    Pack years: 63.00    Types: Cigarettes    Last attempt to quit: 08/18/2002    Years since quitting: 15.2  . Smokeless tobacco: Former Engineer, waterUser  Substance and Sexual Activity  . Alcohol use: Yes    Comment: RARE  . Drug use: No  . Sexual activity: Not on file

## 2017-11-24 ENCOUNTER — Telehealth (INDEPENDENT_AMBULATORY_CARE_PROVIDER_SITE_OTHER): Payer: Self-pay | Admitting: Orthopedic Surgery

## 2017-11-24 NOTE — Telephone Encounter (Signed)
Please advise. Thanks.  

## 2017-11-24 NOTE — Telephone Encounter (Signed)
Please review and advise. Thanks.  

## 2017-11-24 NOTE — Telephone Encounter (Signed)
Ok to fill on 20th

## 2017-11-24 NOTE — Telephone Encounter (Signed)
Patient called requesting an RX refill on his Tramadol.  He knows that it is a little early to be filling it, but he is going out of town tomorrow and wanted to go ahead and get the prescription.  Also he wanted to know if Dr. August Saucerean would increase the dosage to two pills per day.  He stated that Dr. August Saucerean talked about Tylenol #3, but did not know if he would prescribe it.  [phone].  Thank you.

## 2017-11-24 NOTE — Telephone Encounter (Signed)
IC LM for patient advising.  

## 2017-11-24 NOTE — Telephone Encounter (Signed)
Told patient about the medication being able to be filled until the 20th, but he wanted to know if that was for the tramadol or tylenol? CB # [phone]

## 2017-11-25 NOTE — Telephone Encounter (Signed)
both

## 2017-11-25 NOTE — Telephone Encounter (Signed)
IC patient and advised.  

## 2017-11-30 ENCOUNTER — Telehealth (INDEPENDENT_AMBULATORY_CARE_PROVIDER_SITE_OTHER): Payer: Self-pay | Admitting: Orthopedic Surgery

## 2017-11-30 NOTE — Telephone Encounter (Signed)
Patient called asking if the tramadol and tylenol 3's could be sent into his pharmacy tomorrow morning for him so his wife can pick it up for him. CB # [phone]

## 2017-11-30 NOTE — Telephone Encounter (Signed)
Ok to rf today?  Asking for ultram and T#3. Ok for both?

## 2017-12-01 ENCOUNTER — Telehealth (INDEPENDENT_AMBULATORY_CARE_PROVIDER_SITE_OTHER): Payer: Self-pay | Admitting: Orthopedic Surgery

## 2017-12-01 NOTE — Telephone Encounter (Signed)
Returned call to patient left message for a call back  °

## 2017-12-01 NOTE — Telephone Encounter (Signed)
ok 

## 2017-12-01 NOTE — Telephone Encounter (Signed)
Patient called back wanting to know if he could get his RX today, his wife has to pick it up for him at a pharmacy here and go fly out to [place] to spend the next few weeks with him, and she's waiting on his RX's to be sent in before she can leave. CB # [phone]

## 2017-12-02 MED ORDER — TRAMADOL HCL 50 MG PO TABS: ORAL_TABLET | ORAL | 0 refills | Status: DC

## 2017-12-02 NOTE — Telephone Encounter (Signed)
Called to pharmacy. Patient advised per Dr August Saucerean last rx

## 2017-12-02 NOTE — Telephone Encounter (Signed)
I have to s/w Dr August Saucerean to clarify if ok to fill both ultram and T#3

## 2017-12-02 NOTE — Telephone Encounter (Signed)
Per Dr August Saucerean only ultram or T#3 and this will be the last rx. Will need to get from PCP after this last rx. Will call patient.

## 2017-12-02 NOTE — Addendum Note (Signed)
Addended byPrescott Parma: Thu Baggett on: 12/02/2017 08:43 AM   Modules accepted: Orders

## 2017-12-02 NOTE — Telephone Encounter (Signed)
Patient called again this morning left voicemail in regards to his medication. CB # [phone]

## 2017-12-13 ENCOUNTER — Encounter (HOSPITAL_COMMUNITY): Payer: Self-pay

## 2017-12-13 ENCOUNTER — Ambulatory Visit (HOSPITAL_COMMUNITY)
Admission: EM | Admit: 2017-12-13 | Discharge: 2017-12-13 | Disposition: A | Discharge status: Home / Self Care | Payer: BLUE CROSS/BLUE SHIELD | Attending: Family Medicine | Admitting: Family Medicine

## 2017-12-13 DIAGNOSIS — J40 Bronchitis, not specified as acute or chronic: Secondary | ICD-10-CM | POA: Diagnosis not present

## 2017-12-13 MED ORDER — BENZONATATE 100 MG PO CAPS: 100.0000 mg | ORAL_CAPSULE | Freq: Three times a day (TID) | ORAL | 0 refills | Status: DC

## 2017-12-13 MED ORDER — CETIRIZINE HCL 10 MG PO TABS
10.0000 mg | ORAL_TABLET | Freq: Every day | ORAL | 0 refills | Status: DC
Start: 2017-12-13 — End: 2018-03-16

## 2017-12-13 MED ORDER — LOSARTAN POTASSIUM-HCTZ 50-12.5 MG PO TABS: 1.0000 | ORAL_TABLET | Freq: Every day | ORAL | 0 refills | Status: DC

## 2017-12-13 MED ORDER — IPRATROPIUM BROMIDE 0.06 % NA SOLN: 2.0000 | Freq: Four times a day (QID) | NASAL | 0 refills | Status: DC

## 2017-12-13 MED ORDER — AZITHROMYCIN 250 MG PO TABS: 250.0000 mg | ORAL_TABLET | Freq: Every day | ORAL | 0 refills | Status: DC

## 2017-12-13 MED ORDER — PREDNISONE 50 MG PO TABS: 50.0000 mg | ORAL_TABLET | Freq: Every day | ORAL | 0 refills | Status: AC

## 2017-12-13 NOTE — Discharge Instructions (Addendum)
Azithromycin and prednisone for bronchitis. Tessalon for cough. Start flonase, atrovent nasal spray, zyrtec for nasal congestion/drainage. You can use over the counter nasal saline rinse such as neti pot for nasal congestion. Keep hydrated, your urine should be clear to pale yellow in color. Tylenol/motrin for fever and pain. Monitor for any worsening of symptoms, chest pain, shortness of breath, wheezing, swelling of the throat, follow up for reevaluation.   For sore throat/cough try using a honey-based tea. Use 3 teaspoons of honey with juice squeezed from half lemon. Place shaved pieces of ginger into 1/2-1 cup of water and warm over stove top. Then mix the ingredients and repeat every 4 hours as needed.  Refilled blood pressure medicine. Please restart. Follow up with PCP for further refill.

## 2017-12-13 NOTE — ED Triage Notes (Signed)
Pt presents with unresolved cold symptoms.

## 2017-12-13 NOTE — ED Provider Notes (Signed)
[facility]    CSN: [id number] Arrival date & time: 12/13/17  1825     History   Chief Complaint Chief Complaint  Patient presents with  . URI    HPI Mike Watkins is a 52 y.o. male.   52 year old male comes in for 6-day history of URI symptoms.  Has had rhinorrhea, nasal congestion, productive cough, eye watering, sneezing.  Subjective fever without chills or night sweats.  Denies chest pain, shortness of breath.  Has some wheezing with cough that is improved with albuterol.  OTC cold medication with temporary relief.  Former smoker, 50+-pack-year history.     Past Medical History:  Diagnosis Date  . Arthritis   . Hypertension   . Penile lesion     Patient Active Problem List   Diagnosis Date Noted  . Chronic pain of left knee 02/07/2017  . Fall -multiple falls in the past year. 08/29/2016  . Acute pain of right knee 08/29/2016  . Contusion of right hand 07/14/2016  . Ganglion of flexor tendon sheath of right middle finger 06/02/2016  . Impingement syndrome of left shoulder 04/20/2016    Past Surgical History:  Procedure Laterality Date  . ANTERIOR CERVICAL DECOMP/DISCECTOMY FUSION  12-31-2002   C5 -- C7  . CYST EXCISION     pain in r/hand-middle finger  . HAND SURGERY    . PENILE BIOPSY N/A 08/23/2012   Procedure: EXCISIONAL BIOPSY OF PENILE LESION;  Surgeon: Milford Cage, MD;  Location: [facility];  Service: Urology;  Laterality: N/A;  EXCISIONAL BIOPSY OF PENILE LESION    . POSTERIOR FUSION CERVICAL SPINE  02-26-2005   C5 -- C7       Home Medications    Prior to Admission medications   Medication Sig Start Date End Date Taking? Authorizing Provider  albuterol (PROVENTIL HFA;VENTOLIN HFA) 108 (90 Base) MCG/ACT inhaler Inhale 1-2 puffs into the lungs every 6 (six) hours as needed for wheezing or shortness of breath. 09/26/16  Yes Doreene Eland, MD  azithromycin (ZITHROMAX) 250 MG tablet Take 1 tablet (250 mg  total) by mouth daily. Take first 2 tablets together, then 1 every day until finished. 12/13/17   Cathie Hoops, Amy V, PA-C  benzonatate (TESSALON) 100 MG capsule Take 1 capsule (100 mg total) by mouth every 8 (eight) hours. 12/13/17   Cathie Hoops, Amy V, PA-C  cetirizine (ZYRTEC) 10 MG tablet Take 1 tablet (10 mg total) by mouth daily. 12/13/17   Cathie Hoops, Amy V, PA-C  Diclofenac Sodium (PENNSAID) 2 % SOLN Place 1 application onto the skin 2 (two) times daily. Patient not taking: Reported on 10/14/2017 08/25/16   [name], DO  ipratropium (ATROVENT) 0.06 % nasal spray Place 2 sprays into both nostrils 4 (four) times daily. 12/13/17   Cathie Hoops, Amy V, PA-C  losartan-hydrochlorothiazide (HYZAAR) 50-12.5 MG tablet Take 1 tablet by mouth daily. 12/13/17   Cathie Hoops, Amy V, PA-C  naproxen (NAPROSYN) 500 MG tablet Take 1 tablet (500 mg total) 2 (two) times daily by mouth. Patient not taking: Reported on 10/14/2017 04/23/17   Jacinto Halim, PA-C  omeprazole (PRILOSEC) 20 MG capsule Take 1 capsule (20 mg total) daily by mouth. 04/23/17   Maczis, Elmer Sow, PA-C  predniSONE (DELTASONE) 50 MG tablet Take 1 tablet (50 mg total) by mouth daily for 5 days. 12/13/17 12/18/17  Belinda Fisher, PA-C  traMADol [name] 50 MG tablet 1 po q d prn pain 12/02/17   Cammy Copa, MD  Family History Family History  Problem Relation Age of Onset  . Hypertension Mother   . Diabetes Mother   . Hypertension Father   . Cancer Father     Social History Social History   Tobacco Use  . Smoking status: Former Smoker    Packs/day: 3.00    Years: 21.00    Pack years: 63.00    Types: Cigarettes    Last attempt to quit: 08/18/2002    Years since quitting: 15.3  . Smokeless tobacco: Former Engineer, waterUser  Substance Use Topics  . Alcohol use: Yes    Comment: RARE  . Drug use: No     Allergies   Penicillins and Sulfa antibiotics   Review of Systems Review of Systems  Reason unable to perform ROS: See HPI as above.     Physical Exam Triage Vital Signs ED  Triage Vitals  Enc Vitals Group     BP 12/13/17 1904 (!) 158/93     Pulse Rate 12/13/17 1904 90     Resp 12/13/17 1904 20     Temp 12/13/17 1904 98.1 F (36.7 C)     Temp Source 12/13/17 1904 Oral     SpO2 12/13/17 1904 98 %     Weight --      Height --      Head Circumference --      Peak Flow --      Pain Score 12/13/17 1902 6     Pain Loc --      Pain Edu? --      Excl. in GC? --    No data found.  Updated Vital Signs BP (!) 158/93 (BP Location: Left Arm)   Pulse 90   Temp 98.1 F (36.7 C) (Oral)   Resp 20   SpO2 98%   Physical Exam  Constitutional: He is oriented to person, place, and time. He appears well-developed and well-nourished. No distress.  HENT:  Head: Normocephalic and atraumatic.  Left Ear: Tympanic membrane, external ear and ear canal normal. Tympanic membrane is not erythematous and not bulging.  Nose: Rhinorrhea present. Right sinus exhibits no maxillary sinus tenderness and no frontal sinus tenderness. Left sinus exhibits no maxillary sinus tenderness and no frontal sinus tenderness.  Mouth/Throat: Uvula is midline, oropharynx is clear and moist and mucous membranes are normal.  Right ear with cerumen impaction, TM not visible.  Eyes: Pupils are equal, round, and reactive to light. Conjunctivae are normal.  Neck: Normal range of motion. Neck supple.  Cardiovascular: Normal rate, regular rhythm and normal heart sounds. Exam reveals no gallop and no friction rub.  No murmur heard. Pulmonary/Chest: Effort normal and breath sounds normal. No accessory muscle usage or stridor. No respiratory distress. He has no decreased breath sounds. He has no wheezes. He has no rhonchi. He has no rales.  Lymphadenopathy:    He has no cervical adenopathy.  Neurological: He is alert and oriented to person, place, and time.  Skin: Skin is warm and dry.  Psychiatric: He has a normal mood and affect. His behavior is normal. Judgment normal.     UC Treatments / Results    Labs (all labs ordered are listed, but only abnormal results are displayed) Labs Reviewed - No data to display  EKG None  Radiology No results found.  Procedures Procedures (including critical care time)  Medications Ordered in UC Medications - No data to display  Initial Impression / Assessment and Plan / UC Course  I have reviewed the  triage vital signs and the nursing notes.  Pertinent labs & imaging results that were available during my care of the patient were reviewed by me and considered in my medical decision making (see chart for details).    Will start azithromycin and prednisone for bronchitis.  Other symptomatic treatment discussed.  Return precautions given.  Patient expresses understanding and agrees to plan.  Patient with elevated blood pressure today, has not been on his blood pressure medicine for the past 4 months.  States he is between PCPs.  Will refill 30-day supply.  Resources provided.  Follow-up with PCP for further evaluation and management of HTN.  Final Clinical Impressions(s) / UC Diagnoses   Final diagnoses:  Bronchitis    ED Prescriptions    Medication Sig Dispense Auth. Provider   predniSONE (DELTASONE) 50 MG tablet Take 1 tablet (50 mg total) by mouth daily for 5 days. 5 tablet Yu, Amy V, PA-C   azithromycin (ZITHROMAX) 250 MG tablet Take 1 tablet (250 mg total) by mouth daily. Take first 2 tablets together, then 1 every day until finished. 6 tablet Yu, Amy V, PA-C   ipratropium (ATROVENT) 0.06 % nasal spray Place 2 sprays into both nostrils 4 (four) times daily. 15 mL Yu, Amy V, PA-C   cetirizine (ZYRTEC) 10 MG tablet Take 1 tablet (10 mg total) by mouth daily. 15 tablet Yu, Amy V, PA-C   losartan-hydrochlorothiazide (HYZAAR) 50-12.5 MG tablet Take 1 tablet by mouth daily. 30 tablet Yu, Amy V, PA-C   benzonatate (TESSALON) 100 MG capsule Take 1 capsule (100 mg total) by mouth every 8 (eight) hours. 21 capsule Threasa Alpha, [place] 12/13/17 1926

## 2017-12-26 ENCOUNTER — Ambulatory Visit (INDEPENDENT_AMBULATORY_CARE_PROVIDER_SITE_OTHER): Payer: BLUE CROSS/BLUE SHIELD | Admitting: Orthopedic Surgery

## 2018-01-04 ENCOUNTER — Encounter (INDEPENDENT_AMBULATORY_CARE_PROVIDER_SITE_OTHER): Payer: Self-pay | Admitting: Orthopedic Surgery

## 2018-01-04 ENCOUNTER — Ambulatory Visit (INDEPENDENT_AMBULATORY_CARE_PROVIDER_SITE_OTHER): Payer: Self-pay

## 2018-01-04 ENCOUNTER — Ambulatory Visit (INDEPENDENT_AMBULATORY_CARE_PROVIDER_SITE_OTHER): Payer: BLUE CROSS/BLUE SHIELD | Admitting: Orthopedic Surgery

## 2018-01-04 DIAGNOSIS — M545 Low back pain: Secondary | ICD-10-CM

## 2018-01-04 DIAGNOSIS — M25551 Pain in right hip: Secondary | ICD-10-CM | POA: Diagnosis not present

## 2018-01-04 MED ORDER — TRAMADOL HCL 50 MG PO TABS: ORAL_TABLET | ORAL | 0 refills | Status: DC

## 2018-01-06 NOTE — Progress Notes (Signed)
Office Visit Note   Patient: Mike AntiguaRonald D Razavi           Date of Birth: 1966-02-13           MRN: [id number] Visit Date: 01/04/2018 Requested by: Lewis Moccasinewey, Elizabeth R, MD [address] [place], [place] [id number] PCP: Patient, No Pcp Per  Subjective: Chief Complaint  Patient presents with  . Right Hip - Pain    HPI: [name] is a patient with right hip pain.  He has had previous cervical fusion and the bone graft from the posterior superior iliac crest site on the right is painful for him by his report.  His surgery was in 2004 in 2006.  He describes pain in the buttock region with radiation down posteriorly into the is hamstring region.  He denies any numbness and tingling.  He is been taking BC arthritis.  He does take tramadol long-term for many other issues.  Denies any discrete history of injury and denies any groin pain.  He reports primarily posterior back pain.              ROS: All systems reviewed are negative as they relate to the chief complaint within the history of present illness.  Patient denies  fevers or chills.   Assessment & Plan: Visit Diagnoses:  1. Pain in right hip     Plan: Impression is right hip and back pain.  I do not think that the bone graft harvest site is really the site of pain generation in this patient.  More likely is the back as a source of radiating pain down the right leg.  No nerve root tension signs weakness or numbness and tingling on the right-hand side but this is the side where he is tender.  His hip exam is benign.  Plan to renew tramadol 1/day for pain and MRI lumbar spine to evaluate right-sided radiculopathy.  I will see him back after that study.  Follow-Up Instructions: Return for after MRI.   Orders:  Orders Placed This Encounter  Procedures  . XR HIP UNILAT W OR W/O PELVIS 2-3 VIEWS RIGHT  . XR Lumbar Spine 2-3 Views  . MR Lumbar Spine w/o contrast   Meds ordered this encounter  Medications  . traMADol (ULTRAM) 50 MG tablet      Sig: 1 po q d prn pain    Dispense:  30 tablet    Refill:  0      Procedures: No procedures performed   Clinical Data: No additional findings.  Objective: Vital Signs: There were no vitals taken for this visit.  Physical Exam:   Constitutional: Patient appears well-developed HEENT:  Head: Normocephalic Eyes:EOM are normal Neck: Normal range of motion Cardiovascular: Normal rate Pulmonary/chest: Effort normal Neurologic: Patient is alert Skin: Skin is warm Psychiatric: Patient has normal mood and affect    Ortho Exam: Ortho exam demonstrates increased body mass index particularly over the past 2 years when I have been seeing him.  He has good hip flexion abduction and adduction strength.  Pedal pulses intact.  No groin pain on the right on the left with internal and external rotation of the leg.  He has no paresthesias L1 S1 bilaterally.  Well-healed surgical incisions from posterior iliac crest bone grafting noted which is not particularly tender to palpation.  Does have some pain with forward and lateral bending.  No other masses lymph adenopathy or skin changes noted in that right hip region.  Specialty Comments:  No specialty comments available.  Imaging: No results found.   PMFS History: Patient Active Problem List   Diagnosis Date Noted  . Chronic pain of left knee 02/07/2017  . Fall -multiple falls in the past year. 08/29/2016  . Acute pain of right knee 08/29/2016  . Contusion of right hand 07/14/2016  . Ganglion of flexor tendon sheath of right middle finger 06/02/2016  . Impingement syndrome of left shoulder 04/20/2016   Past Medical History:  Diagnosis Date  . Arthritis   . Hypertension   . Penile lesion     Family History  Problem Relation Age of Onset  . Hypertension Mother   . Diabetes Mother   . Hypertension Father   . Cancer Father     Past Surgical History:  Procedure Laterality Date  . ANTERIOR CERVICAL DECOMP/DISCECTOMY FUSION   12-31-2002   C5 -- C7  . CYST EXCISION     pain in r/hand-middle finger  . HAND SURGERY    . PENILE BIOPSY N/A 08/23/2012   Procedure: EXCISIONAL BIOPSY OF PENILE LESION;  Surgeon: Milford Cage, MD;  Location: [facility];  Service: Urology;  Laterality: N/A;  EXCISIONAL BIOPSY OF PENILE LESION    . POSTERIOR FUSION CERVICAL SPINE  02-26-2005   C5 -- C7   Social History   Occupational History  . Not on file  Tobacco Use  . Smoking status: Former Smoker    Packs/day: 3.00    Years: 21.00    Pack years: 63.00    Types: Cigarettes    Last attempt to quit: 08/18/2002    Years since quitting: 15.3  . Smokeless tobacco: Former Engineer, water and Sexual Activity  . Alcohol use: Yes    Comment: RARE  . Drug use: No  . Sexual activity: Not on file

## 2018-01-27 ENCOUNTER — Telehealth (INDEPENDENT_AMBULATORY_CARE_PROVIDER_SITE_OTHER): Payer: Self-pay | Admitting: Orthopedic Surgery

## 2018-01-27 NOTE — Telephone Encounter (Signed)
Not yet

## 2018-01-27 NOTE — Telephone Encounter (Signed)
Ok to rf? 

## 2018-01-27 NOTE — Telephone Encounter (Signed)
IC s/w patient and advised  

## 2018-01-27 NOTE — Telephone Encounter (Signed)
Patient requesting rx refill on tramadol. # [phone]

## 2018-01-30 ENCOUNTER — Other Ambulatory Visit (INDEPENDENT_AMBULATORY_CARE_PROVIDER_SITE_OTHER): Payer: Self-pay | Admitting: Orthopedic Surgery

## 2018-02-05 ENCOUNTER — Other Ambulatory Visit: Payer: BLUE CROSS/BLUE SHIELD

## 2018-02-10 ENCOUNTER — Telehealth (INDEPENDENT_AMBULATORY_CARE_PROVIDER_SITE_OTHER): Payer: Self-pay | Admitting: Orthopedic Surgery

## 2018-02-10 NOTE — Telephone Encounter (Signed)
He said ok. He was asking about getting his MRI scheduled, I gave [facility] so he can call them to schedule it.  Looks Berkley Harveyauth has been done for it and is good till 03/11/18

## 2018-02-10 NOTE — Telephone Encounter (Signed)
Please call, thanks 

## 2018-02-10 NOTE — Telephone Encounter (Signed)
Last refill per chart 12/02/17, please advise on refill tramadol

## 2018-02-10 NOTE — Telephone Encounter (Signed)
No.  He needs to get a pain management consult.  He has been on this a long time.

## 2018-02-10 NOTE — Telephone Encounter (Signed)
Patient called to state needed refill on Tramadol.  Please call patient to advise.

## 2018-02-15 ENCOUNTER — Ambulatory Visit (INDEPENDENT_AMBULATORY_CARE_PROVIDER_SITE_OTHER): Payer: BLUE CROSS/BLUE SHIELD | Admitting: Orthopedic Surgery

## 2018-03-16 ENCOUNTER — Ambulatory Visit (INDEPENDENT_AMBULATORY_CARE_PROVIDER_SITE_OTHER): Payer: Self-pay | Admitting: Sports Medicine

## 2018-03-16 ENCOUNTER — Encounter: Payer: Self-pay | Admitting: Sports Medicine

## 2018-03-16 VITALS — BP 138/84 | HR 94 | Ht 69.0 in | Wt 283.6 lb

## 2018-03-16 DIAGNOSIS — M25511 Pain in right shoulder: Secondary | ICD-10-CM

## 2018-03-16 DIAGNOSIS — G8929 Other chronic pain: Secondary | ICD-10-CM

## 2018-03-16 DIAGNOSIS — G894 Chronic pain syndrome: Secondary | ICD-10-CM

## 2018-03-16 MED ORDER — PREDNISONE 20 MG PO TABS: ORAL_TABLET | ORAL | 0 refills | Status: DC

## 2018-03-16 MED ORDER — TRAMADOL HCL 50 MG PO TABS: 50.0000 mg | ORAL_TABLET | Freq: Four times a day (QID) | ORAL | 0 refills | Status: AC | PRN

## 2018-03-16 NOTE — Progress Notes (Signed)
Mike Watkins. Mike Watkins Sports Medicine [facility] [phone]  TAESHAUN RAMES - 52 y.o. male MRN [id number]  Date of birth: Nov 05, 1965  Visit Date: 03/16/2018  PCP: Patient, No Pcp Per   Referred by: No ref. provider found   Scribe(s) for today's visit: Stevenson Clinch, CMA  SUBJECTIVE:  Mike Watkins is here for R shoulder pain   HPI: His R shoulder pain symptoms INITIALLY: Began a few days ago and sx started when stretching over head.  Described as moderate-severe sharp stabbing, grinding, radiating to the fingers of the right hand.  Worsened with movement. Improved with rest.  Additional associated symptoms include: He has noticed popping since the time of injury. Pain with reaching overhead. Most of the pain is anterior. He also reports pain when reaching down and back. 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 He denies neck pain but does he does have hx of neck surgery, fusion.     At this time symptoms are worsening compared to onset. He has been applying IcyHot with minimal relief. IBU and Tylenol provide minimal relief.   XR R shoulder 10/14/2017, XR C-spine 04/20/2016.    REVIEW OF SYSTEMS: Reports night time disturbances. Denies fevers, chills, or night sweats. Denies unexplained weight loss. Denies personal history of cancer. Denies changes in bowel or bladder habits. Reports recent unreported falls, tripped over cat, fell on couch. Denies new or worsening dyspnea or wheezing. Denies headaches or dizziness.  Reports numbness, tingling in fingers occasionally.  Denies dizziness or presyncopal episodes Denies lower extremity edema    HISTORY:  Prior history reviewed and updated per electronic medical record.  Social History   Occupational History  . Not on file  Tobacco Use  . Smoking status: Former Smoker    Packs/day: 3.00    Years: 21.00    Pack years: 63.00    Types: Cigarettes    Last  attempt to quit: 08/18/2002    Years since quitting: 15.6  . Smokeless tobacco: Former Engineer, water and Sexual Activity  . Alcohol use: Yes    Comment: RARE  . Drug use: No  . Sexual activity: Not on file   Social History   Social History Narrative  . Not on file    DATA OBTAINED & REVIEWED:  No results for input(s): HGBA1C, LABURIC, CREATINE in the last 8760 hours.   XR R shoulder 10/14/2017, XR C-spine 04/20/2016. Mike Watkins   OBJECTIVE:  VS:  HT:5\' 9"  (175.3 cm)   WT:283 lb 9.6 oz (128.6 kg)  BMI:41.86    BP:138/84  HR:94bpm  TEMP: ( )  RESP:94 %   PHYSICAL EXAM: CONSTITUTIONAL: Well-developed, Well-nourished and In no acute distress PSYCHIATRIC: Alert & appropriately interactive. and Not depressed or anxious appearing. RESPIRATORY: No increased work of breathing and Trachea Midline EYES: Pupils are equal., EOM intact without nystagmus. and No scleral icterus.  VASCULAR EXAM: Warm and well perfused NEURO: unremarkable  MSK Exam: Right shoulder  Well aligned, no significant deformity. No overlying skin changes. TTP over Diffuse anterior shoulder.  Pain over the levator scapula and trapezius.   RANGE OF MOTION & STRENGTH  Limited overhead range of motion 4 out of 5 strength in all planes.   SPECIALITY TESTING:  Pain with Hawkins and Neer's.  Mild Spurling's compression test positive on the right.  Negative Lhermitte's compression test.  Pain with speeds, O'Briens and empty can testing.  Slight crepitation with  axial load and circumduction.    ASSESSMENT   1. Right shoulder pain, unspecified chronicity   2. Chronic right shoulder pain   3. Chronic pain syndrome     PLAN:  Pertinent additional documentation may be included in corresponding procedure notes, imaging studies, problem based documentation and patient instructions.  Procedures:  . None  Medications:  Meds ordered this encounter  Medications  . predniSONE (DELTASONE) 20 MG tablet    Sig: Take 3tabs  PO QAM x3days, 2tabs PO QAM x3days, 1tab PO QAM x4days, 0.5tab PO QAM X4days    Dispense:  21 tablet    Refill:  0  . traMADol (ULTRAM) 50 MG tablet    Sig: Take 1 tablet (50 mg total) by mouth every 6 (six) hours as needed for up to 5 days for moderate pain.    Dispense:  20 tablet    Refill:  0   Discussion/Instructions: No problem-specific Assessment & Plan notes found for this encounter.  . Long discussion today regarding treatment options for the patient.  Ultimately referral to pain management is likely the most prudent option for him.  At this point he has been followed by Dr. August Saucer who has requested an MRI of the shoulder previously but this has not been obtained.  We will reorder this today.  Discussed that I am able to provide him a one-time prescription for tramadol but that no further refills from myself or from Dr. August Saucer will be forthcoming and he should plan to have this further pain medications prescribed by pain management. . Right shoulder injection recommended at this time but he is hesitant to do this is only provided him minimal improvements in the past. . Discussed red flag symptoms that warrant earlier emergent evaluation and patient voices understanding. . Activity modifications and the importance of avoiding exacerbating activities (limiting pain to no more than a 4 / 10 during or following activity) recommended and discussed. . >50% of this 25 minutes minute visit spent in direct patient counseling and/or coordination of care. Discussion was focused on education regarding the in discussing the pathoetiology and anticipated clinical course of the above condition.  Follow-up:  . Return if symptoms worsen or fail to improve, for as needed for ongoing issues.  . With pain management for ongoing care and with Dr. August Saucer for MRI review.      CMA/ATC served as Neurosurgeon during this visit. History, Physical, and Plan performed by medical provider. Documentation and orders reviewed  and attested to.      Mike Mews, DO     Sports Medicine Physician

## 2018-03-16 NOTE — Patient Instructions (Addendum)
Please follow-up w/ Dr. August Saucer after your R shoulder MRI.  You will need to call their office to schedule an appointment [phone]),  We are ordering an MRI for you today.  The imaging office will be calling you to schedule your appointment after we obtain authorization from your insurance company.   Please be sure you have signed up for MyChart so that we can get your results to you.  We will be in touch with you as soon as we can.  Please know, it can take up to 3-4 business days for the radiologist and Dr. Berline Chough to have time to review the results and determine the best appropriate action.  If there is something that appears to be surgical or needs a referral to other specialists we will let you know through MyChart or telephone.  Otherwise we will plan to schedule a follow up appointment with Dr. Berline Chough once we have the results.

## 2018-03-27 ENCOUNTER — Encounter: Payer: Self-pay | Admitting: Sports Medicine

## 2018-03-31 ENCOUNTER — Other Ambulatory Visit: Payer: Self-pay | Admitting: Sports Medicine

## 2018-04-26 ENCOUNTER — Encounter (INDEPENDENT_AMBULATORY_CARE_PROVIDER_SITE_OTHER): Payer: Self-pay | Admitting: Orthopedic Surgery

## 2018-04-26 ENCOUNTER — Ambulatory Visit (INDEPENDENT_AMBULATORY_CARE_PROVIDER_SITE_OTHER): Payer: Self-pay | Admitting: Orthopedic Surgery

## 2018-04-26 DIAGNOSIS — M1712 Unilateral primary osteoarthritis, left knee: Secondary | ICD-10-CM

## 2018-04-26 NOTE — Progress Notes (Signed)
Office Visit Note   Patient: Mike Watkins           Date of Birth: 1965/10/19           MRN: [id number] Visit Date: 04/26/2018 Requested by: No referring provider defined for this encounter. PCP: Patient, No Pcp Per  Subjective: Chief Complaint  Patient presents with  . Left Knee - Pain    HPI: Mike Watkins is a patient with left knee pain.  He has a chronic degenerative left knee meniscal tear.  He is had that for over 6 months documented and diagnosed.  He said various issues regarding surgical intervention for that left knee.  In general he has been a fairly frequent user of tramadol as evidenced in his narcotic report.  He does have a medial meniscal tear as well as joint space narrowing on plain radiographs.  Mild OA was the interpretation on the MRI scan.  He states that injections have not been helpful.  He has an appointment and pain management in 2 days at [facility] but he states that he cannot afford that appointment due to some other financial issues.  He is requesting tramadol today.              ROS: All systems reviewed are negative as they relate to the chief complaint within the history of present illness.  Patient denies  fevers or chills.   Assessment & Plan: Visit Diagnoses:  1. Unilateral primary osteoarthritis, left knee     Plan: Impression is left knee pain with early arthritis and no mechanical symptoms from a meniscal tear.  He does have a trace effusion today.  We will try him with some of Duexis and pen said.  Samples provided.  If that does not help I think he may have to to live with this.  He is overweight and this tear looks primarily degenerative in nature.  Arthroscopy may or may not be beneficial for him.  I think weight loss would be beneficial.  I will see him back as needed  Follow-Up Instructions: Return if symptoms worsen or fail to improve.   Orders:  No orders of the defined types were placed in this encounter.  No orders of the  defined types were placed in this encounter.     Procedures: No procedures performed   Clinical Data: No additional findings.  Objective: Vital Signs: There were no vitals taken for this visit.  Physical Exam:   Constitutional: Patient appears well-developed HEENT:  Head: Normocephalic Eyes:EOM are normal Neck: Normal range of motion Cardiovascular: Normal rate Pulmonary/chest: Effort normal Neurologic: Patient is alert Skin: Skin is warm Psychiatric: Patient has normal mood and affect    Ortho Exam: Ortho exam demonstrates full active and passive range of motion of that left knee.  Collateral crucial ligaments are stable.  Trace effusion is present.  No groin pain with internal extra rotation of the leg.  No other masses lymph adenopathy or skin changes noted in the left knee region.  Mild medial joint line tenderness is present.  Specialty Comments:  No specialty comments available.  Imaging: No results found.   PMFS History: Patient Active Problem List   Diagnosis Date Noted  . Chronic pain of left knee 02/07/2017  . Fall -multiple falls in the past year. 08/29/2016  . Acute pain of right knee 08/29/2016  . Contusion of right hand 07/14/2016  . Ganglion of flexor tendon sheath of right middle finger 06/02/2016  . Impingement  syndrome of left shoulder 04/20/2016   Past Medical History:  Diagnosis Date  . Arthritis   . Hypertension   . Penile lesion     Family History  Problem Relation Age of Onset  . Hypertension Mother   . Diabetes Mother   . Hypertension Father   . Cancer Father     Past Surgical History:  Procedure Laterality Date  . ANTERIOR CERVICAL DECOMP/DISCECTOMY FUSION  12-31-2002   C5 -- C7  . CYST EXCISION     pain in r/hand-middle finger  . HAND SURGERY    . PENILE BIOPSY N/A 08/23/2012   Procedure: EXCISIONAL BIOPSY OF PENILE LESION;  Surgeon: [name], MD;  [facility];  Service: Urology;   Laterality: N/A;  EXCISIONAL BIOPSY OF PENILE LESION    . POSTERIOR FUSION CERVICAL SPINE  02-26-2005   C5 -- C7   Social History   Occupational History  . Not on file  Tobacco Use  . Smoking status: Former Smoker    Packs/day: 3.00    Years: 21.00    Pack years: 63.00    Types: Cigarettes    Last attempt to quit: 08/18/2002    Years since quitting: 15.6  . Smokeless tobacco: Former Engineer, waterUser  Substance and Sexual Activity  . Alcohol use: Yes    Comment: RARE  . Drug use: No  . Sexual activity: Not on file

## 2018-05-06 ENCOUNTER — Emergency Department (HOSPITAL_COMMUNITY): Payer: Self-pay

## 2018-05-06 ENCOUNTER — Emergency Department (HOSPITAL_COMMUNITY)
Admission: EM | Admit: 2018-05-06 | Discharge: 2018-05-06 | Disposition: A | Discharge status: Home / Self Care | Payer: Self-pay | Attending: Emergency Medicine | Admitting: Emergency Medicine

## 2018-05-06 ENCOUNTER — Other Ambulatory Visit: Payer: Self-pay

## 2018-05-06 DIAGNOSIS — Z87891 Personal history of nicotine dependence: Secondary | ICD-10-CM | POA: Insufficient documentation

## 2018-05-06 DIAGNOSIS — I10 Essential (primary) hypertension: Secondary | ICD-10-CM | POA: Insufficient documentation

## 2018-05-06 DIAGNOSIS — M1712 Unilateral primary osteoarthritis, left knee: Secondary | ICD-10-CM | POA: Insufficient documentation

## 2018-05-06 MED ORDER — IBUPROFEN 600 MG PO TABS: 600.0000 mg | ORAL_TABLET | Freq: Four times a day (QID) | ORAL | 0 refills | Status: DC | PRN

## 2018-05-06 MED ORDER — TRAMADOL HCL 50 MG PO TABS: 50.0000 mg | ORAL_TABLET | Freq: Four times a day (QID) | ORAL | 0 refills | Status: DC | PRN

## 2018-05-06 NOTE — ED Triage Notes (Signed)
Pt reports he has a torn meniscus in the left knee. Pt reports he has an appointment December 2nd, but stepped off the curb wrong today and reports it pulled it more. Pt denies surgery on the knee

## 2018-05-06 NOTE — ED Notes (Signed)
ED Provider at bedside. 

## 2018-05-06 NOTE — ED Notes (Signed)
Patient transported to X-ray 

## 2018-05-06 NOTE — ED Provider Notes (Signed)
[facility]-EMERGENCY DEPT Provider Note   CSN: [id number] Arrival date & time: 05/06/18  0940     History   Chief Complaint Chief Complaint  Patient presents with  . Knee Injury    HPI Mike Watkins is a 52 y.o. male.  52 year old male presents with left knee pain after stepping off a curb today.  Has a history of prior knee injury and is scheduled to see an orthopedist in about 2 weeks.  Characterizes the pain is sharp and worse with standing localized to his left medial knee.  No hip or ankle discomfort.  Pain better with rest and no treatment used prior to arrival     Past Medical History:  Diagnosis Date  . Arthritis   . Hypertension   . Penile lesion     Patient Active Problem List   Diagnosis Date Noted  . Chronic pain of left knee 02/07/2017  . Fall -multiple falls in the past year. 08/29/2016  . Acute pain of right knee 08/29/2016  . Contusion of right hand 07/14/2016  . Ganglion of flexor tendon sheath of right middle finger 06/02/2016  . Impingement syndrome of left shoulder 04/20/2016    Past Surgical History:  Procedure Laterality Date  . ANTERIOR CERVICAL DECOMP/DISCECTOMY FUSION  12-31-2002   C5 -- C7  . CYST EXCISION     pain in r/hand-middle finger  . HAND SURGERY    . PENILE BIOPSY N/A 08/23/2012   Procedure: EXCISIONAL BIOPSY OF PENILE LESION;  Surgeon: Milford Cage, MD;  Location: [facility];  Service: Urology;  Laterality: N/A;  EXCISIONAL BIOPSY OF PENILE LESION    . POSTERIOR FUSION CERVICAL SPINE  02-26-2005   C5 -- C7        Home Medications    Prior to Admission medications   Medication Sig Start Date End Date Taking? Authorizing Provider  Diclofenac Sodium (PENNSAID) 2 % SOLN Place 1 application onto the skin 2 (two) times daily. 08/25/16   [name], DO  predniSONE (DELTASONE) 20 MG tablet Take 3tabs PO QAM x3days, 2tabs PO QAM x3days, 1tab PO QAM x4days, 0.5tab PO QAM  X4days 03/16/18   [name], DO    Family History Family History  Problem Relation Age of Onset  . Hypertension Mother   . Diabetes Mother   . Hypertension Father   . Cancer Father     Social History Social History   Tobacco Use  . Smoking status: Former Smoker    Packs/day: 3.00    Years: 21.00    Pack years: 63.00    Types: Cigarettes    Last attempt to quit: 08/18/2002    Years since quitting: 15.7  . Smokeless tobacco: Former Engineer, water Use Topics  . Alcohol use: Yes    Comment: RARE  . Drug use: No     Allergies   Penicillins and Sulfa antibiotics   Review of Systems Review of Systems  All other systems reviewed and are negative.    Physical Exam Updated Vital Signs BP (!) 159/89   Pulse 87   Temp 97.6 F (36.4 C)   Resp 15   Ht 1.778 m (5\' 10" )   SpO2 97%   BMI 40.69 kg/m   Physical Exam  Constitutional: He is oriented to person, place, and time. He appears well-developed and well-nourished.  Non-toxic appearance.  HENT:  Head: Normocephalic and atraumatic.  Eyes: Pupils are equal, round, and reactive to light. Conjunctivae  are normal.  Neck: Normal range of motion.  Cardiovascular: Normal rate.  Pulmonary/Chest: Effort normal.  Musculoskeletal:       Left knee: He exhibits MCL laxity. He exhibits no swelling, no effusion and no LCL laxity. No patellar tendon tenderness noted.  Neurological: He is alert and oriented to person, place, and time.  Skin: Skin is warm and dry.  Psychiatric: He has a normal mood and affect.  Nursing note and vitals reviewed.    ED Treatments / Results  Labs (all labs ordered are listed, but only abnormal results are displayed) Labs Reviewed - No data to display  EKG None  Radiology No results found.  Procedures Procedures (including critical care time)  Medications Ordered in ED Medications - No data to display   Initial Impression / Assessment and Plan / ED Course  I have reviewed the  triage vital signs and the nursing notes.  Pertinent labs & imaging results that were available during my care of the patient were reviewed by me and considered in my medical decision making (see chart for details).    Left knee films today only consistent with osteoarthritis.  Patient encouraged to keep his appointment with his orthopedist in 2 weeks  Final Clinical Impressions(s) / ED Diagnoses   Final diagnoses:  None    ED Discharge Orders    None       Lorre NickAllen, Delaila Nand, MD 05/06/18 1108

## 2018-06-24 ENCOUNTER — Emergency Department (HOSPITAL_COMMUNITY)
Admission: EM | Admit: 2018-06-24 | Discharge: 2018-06-24 | Disposition: A | Discharge status: Home / Self Care | Payer: BLUE CROSS/BLUE SHIELD | Attending: Emergency Medicine | Admitting: Emergency Medicine

## 2018-06-24 ENCOUNTER — Other Ambulatory Visit: Payer: Self-pay

## 2018-06-24 ENCOUNTER — Encounter (HOSPITAL_COMMUNITY): Payer: Self-pay | Admitting: Emergency Medicine

## 2018-06-24 DIAGNOSIS — M5431 Sciatica, right side: Secondary | ICD-10-CM

## 2018-06-24 DIAGNOSIS — Y939 Activity, unspecified: Secondary | ICD-10-CM | POA: Insufficient documentation

## 2018-06-24 DIAGNOSIS — M5441 Lumbago with sciatica, right side: Secondary | ICD-10-CM | POA: Diagnosis not present

## 2018-06-24 DIAGNOSIS — Y92003 Bedroom of unspecified non-institutional (private) residence as the place of occurrence of the external cause: Secondary | ICD-10-CM | POA: Diagnosis not present

## 2018-06-24 DIAGNOSIS — Y999 Unspecified external cause status: Secondary | ICD-10-CM | POA: Insufficient documentation

## 2018-06-24 DIAGNOSIS — X509XXA Other and unspecified overexertion or strenuous movements or postures, initial encounter: Secondary | ICD-10-CM | POA: Diagnosis not present

## 2018-06-24 DIAGNOSIS — I10 Essential (primary) hypertension: Secondary | ICD-10-CM | POA: Insufficient documentation

## 2018-06-24 DIAGNOSIS — Z87891 Personal history of nicotine dependence: Secondary | ICD-10-CM | POA: Diagnosis not present

## 2018-06-24 DIAGNOSIS — S3992XA Unspecified injury of lower back, initial encounter: Secondary | ICD-10-CM | POA: Diagnosis present

## 2018-06-24 MED ORDER — TRAMADOL HCL 50 MG PO TABS: 50.0000 mg | ORAL_TABLET | Freq: Every evening | ORAL | 0 refills | Status: DC | PRN

## 2018-06-24 MED ORDER — PREDNISONE 10 MG PO TABS: 40.0000 mg | ORAL_TABLET | Freq: Every day | ORAL | 0 refills | Status: DC

## 2018-06-24 MED ORDER — KETOROLAC TROMETHAMINE 60 MG/2ML IM SOLN
30.0000 mg | Freq: Once | INTRAMUSCULAR | Status: DC
  Filled 2018-06-24: qty 2

## 2018-06-24 MED ORDER — CYCLOBENZAPRINE HCL 10 MG PO TABS: 10.0000 mg | ORAL_TABLET | Freq: Two times a day (BID) | ORAL | 0 refills | Status: DC | PRN

## 2018-06-24 NOTE — ED Triage Notes (Signed)
Patient states, "I messed up my back." Patient states he woke up with back and right leg pain. After getting out of bed he reports he could barely walk.

## 2018-06-24 NOTE — Discharge Instructions (Addendum)
Do not drive while taking the narcotic or the muscle relaxer because they will make you sleepy.

## 2018-06-24 NOTE — ED Provider Notes (Signed)
[name]-EMERGENCY DEPT Provider Note   CSN: [id number] Arrival date & time: 06/24/18  1202     History   Chief Complaint Chief Complaint  Patient presents with  . Back Pain    HPI Mike Watkins is a 53 y.o. male who presents to the ED with back pain. Patient reports he was in bed and rolled over about 1 am and felt a severe pain in the right lower back that radiated to the right leg. Patient reports taking Tramadol that he had for his knee and it did help. Patient got up and went to work but now the pain has returned. 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 Arthritis   . Hypertension   . Penile lesion     Patient Active Problem List   Diagnosis Date Noted  . Chronic pain of left knee 02/07/2017  . Fall -multiple falls in the past year. 08/29/2016  . Acute pain of right knee 08/29/2016  . Contusion of right hand 07/14/2016  . Ganglion of flexor tendon sheath of right middle finger 06/02/2016  . Impingement syndrome of left shoulder 04/20/2016    Past Surgical History:  Procedure Laterality Date  . ANTERIOR CERVICAL DECOMP/DISCECTOMY FUSION  12-31-2002   C5 -- C7  . CYST EXCISION     pain in r/hand-middle finger  . HAND SURGERY    . PENILE BIOPSY N/A 08/23/2012   Procedure: EXCISIONAL BIOPSY OF PENILE LESION;  Surgeon: Milford Cage, MD;  Location: [facility];  Service: Urology;  Laterality: N/A;  EXCISIONAL BIOPSY OF PENILE LESION    . POSTERIOR FUSION CERVICAL SPINE  02-26-2005   C5 -- C7        Home Medications    Prior to Admission medications   Medication Sig Start Date End Date Taking? Authorizing Provider  acetaminophen (TYLENOL) 500 MG tablet Take 1,000 mg by mouth every 6 (six) hours as needed for moderate pain.   Yes [provider]  cyclobenzaprine (FLEXERIL) 10 MG  tablet Take 1 tablet (10 mg total) by mouth 2 (two) times daily as needed for muscle spasms. 06/24/18   Janne Napoleon, NP  predniSONE (DELTASONE) 10 MG tablet Take 4 tablets (40 mg total) by mouth daily with breakfast. 06/24/18   Janne Napoleon, NP  traMADol (ULTRAM) 50 MG tablet Take 1 tablet (50 mg total) by mouth at bedtime as needed. 06/24/18   Janne Napoleon, NP    Family History Family History  Problem Relation Age of Onset  . Hypertension Mother   . Diabetes Mother   . Hypertension Father   . Cancer Father     Social History Social History   Tobacco Use  . Smoking status: Former Smoker    Packs/day: 3.00    Years: 21.00    Pack years: 63.00    Types: Cigarettes    Last attempt to quit: 08/18/2002    Years since quitting: 15.8  . Smokeless tobacco: Former Engineer, water Use Topics  . Alcohol use: Yes    Comment: RARE  . Drug use: No     Allergies   Penicillins and Sulfa antibiotics   Review of Systems Review of Systems  Constitutional: Negative for chills and fever.  HENT: Negative.   Respiratory: Negative for cough and shortness of breath.  Cardiovascular: Negative for chest pain.  Gastrointestinal: Negative for abdominal pain and vomiting.  Genitourinary:       No loss of control of bladder or bowels.   Musculoskeletal: Positive for back pain.  Skin: Negative for rash.  Neurological: Negative for headaches.  Psychiatric/Behavioral: Positive for sleep disturbance (due to pain). Negative for confusion.     Physical Exam Updated Vital Signs BP (!) 144/98 (BP Location: Right Arm)   Pulse 99   Temp 98.5 F (36.9 C) (Oral)   Resp 16   Ht 5\' 10"  (1.778 m)   Wt 129.7 kg   SpO2 96%   BMI 41.04 kg/m   Physical Exam Vitals signs and nursing note reviewed.  Constitutional:      General: He is not in acute distress.    Appearance: He is well-developed.  HENT:     Head: Normocephalic and atraumatic.     Mouth/Throat:     Mouth: Mucous membranes are  moist.  Eyes:     Conjunctiva/sclera: Conjunctivae normal.  Neck:     Musculoskeletal: Normal range of motion and neck supple. No muscular tenderness.  Cardiovascular:     Rate and Rhythm: Normal rate.  Pulmonary:     Effort: Pulmonary effort is normal.  Chest:     Chest wall: No tenderness.  Musculoskeletal:     Lumbar back: He exhibits tenderness and spasm. He exhibits no deformity and normal pulse. Decreased range of motion: due to pain.       Back:  Skin:    General: Skin is warm and dry.  Neurological:     Mental Status: He is alert and oriented to person, place, and time.     Cranial Nerves: No cranial nerve deficit.     Motor: No weakness.     Gait: Gait normal.     Deep Tendon Reflexes: Reflexes are normal and symmetric.  Psychiatric:        Mood and Affect: Mood normal.      ED Treatments / Results  Labs (all labs ordered are listed, but only abnormal results are displayed) Labs Reviewed - No data to display  Radiology No results found.  Procedures Procedures (including critical care time)  Medications Ordered in ED Medications  ketorolac (TORADOL) injection 30 mg (30 mg Intramuscular Not Given 06/24/18 1315)     Initial Impression / Assessment and Plan / ED Course  I have reviewed the triage vital signs and the nursing notes. Patient with back pain.  No neurological deficits and normal neuro exam.  Patient can walk but states is painful.  No loss of bowel or bladder control.  No concern for cauda equina.  No fever, night sweats, weight loss, h/o cancer, IVDU.  RICE protocol and pain medicine indicated and discussed with patient.   Final Clinical Impressions(s) / ED Diagnoses   Final diagnoses:  Sciatica, right side    ED Discharge Orders         Ordered    cyclobenzaprine (FLEXERIL) 10 MG tablet  2 times daily PRN     06/24/18 1308    predniSONE (DELTASONE) 10 MG tablet  Daily with breakfast     06/24/18 1308    traMADol (ULTRAM) 50 MG tablet  At  bedtime PRN     06/24/18 1308           Damian Leavelleese, HalfwayHope M, NP 06/24/18 1524    Cathren LaineSteinl, Kevin, MD 06/24/18 1525

## 2018-06-29 ENCOUNTER — Telehealth (INDEPENDENT_AMBULATORY_CARE_PROVIDER_SITE_OTHER): Payer: Self-pay | Admitting: Radiology

## 2018-06-29 NOTE — Telephone Encounter (Signed)
Patient is requesting more tramadol or flexeril for neck pain, he has already been prescribed this on 06/24/2018 by Kerrie Buffalo NP. Please advise.

## 2018-06-29 NOTE — Telephone Encounter (Signed)
I misunderstood the patient the first time we spoke on the phone, he is actually wanting these medications for his knee not his neck. I just clarified with him that it is his knee. Please advise in regards to the knee. I apologize for the confusion. thanks

## 2018-06-29 NOTE — Telephone Encounter (Signed)
Not seen him for neck pain - no can do thx pls calal

## 2018-06-30 ENCOUNTER — Telehealth (INDEPENDENT_AMBULATORY_CARE_PROVIDER_SITE_OTHER): Payer: Self-pay | Admitting: Orthopedic Surgery

## 2018-06-30 ENCOUNTER — Other Ambulatory Visit (INDEPENDENT_AMBULATORY_CARE_PROVIDER_SITE_OTHER): Payer: Self-pay | Admitting: Radiology

## 2018-06-30 MED ORDER — CYCLOBENZAPRINE HCL 10 MG PO TABS: ORAL_TABLET | ORAL | 0 refills | Status: DC

## 2018-06-30 NOTE — Telephone Encounter (Signed)
Faxed to pharmacy will advise patient.

## 2018-06-30 NOTE — Telephone Encounter (Signed)
Ok for flexeril 5 mg po q 12 # 20 pls clal htx

## 2018-06-30 NOTE — Telephone Encounter (Signed)
Returned call to patient got phone rang busy x 2. Could not leave message

## 2018-07-28 ENCOUNTER — Telehealth (INDEPENDENT_AMBULATORY_CARE_PROVIDER_SITE_OTHER): Payer: Self-pay | Admitting: Orthopaedic Surgery

## 2018-07-28 NOTE — Telephone Encounter (Signed)
Patient called requesting Tramadol. Please call patient to advise.  [phone]

## 2018-07-28 NOTE — Telephone Encounter (Signed)
Let's hold for Dr. Ophelia Charter please.

## 2018-07-28 NOTE — Telephone Encounter (Signed)
Please advise Mike Watkins, Pt wants Tramadol 50mg  to last him until appt w/Dr Ophelia Charter on 08/18/2018; last Rx on 06/24/2018 Qty 6 for 6 days.

## 2018-07-28 NOTE — Telephone Encounter (Signed)
Dr Ophelia Charter, please advise. Thank you

## 2018-07-29 NOTE — Telephone Encounter (Signed)
Sorry No.   440 risk score, multiple prescribers, .

## 2018-07-31 ENCOUNTER — Telehealth (INDEPENDENT_AMBULATORY_CARE_PROVIDER_SITE_OTHER): Payer: Self-pay

## 2018-07-31 NOTE — Telephone Encounter (Signed)
I called pt and informed him of Dr Yates suggestion and he understood. He stated he will f/u on next appt. 

## 2018-07-31 NOTE — Telephone Encounter (Signed)
I called pt and informed him of Dr Ophelia Charter suggestion and he understood. He stated he will f/u on next appt.

## 2018-08-08 ENCOUNTER — Telehealth (INDEPENDENT_AMBULATORY_CARE_PROVIDER_SITE_OTHER): Payer: Self-pay | Admitting: Orthopaedic Surgery

## 2018-08-08 NOTE — Telephone Encounter (Signed)
I called patient. He is going to try some Crista Elliot first. He notices that he is having difficulty writing and has pain down his arm when he turns his head a certain way. He also has stiffness in his fingers. He does not recall an injury, but was wrestling with his grand daughter and she grabbed around his neck a few weeks ago.  He will call back if he wants to proceed with PT. He is also concerned that he may not be able to keep his appt on 08/18/2018 due to work. He will call back and let us know.

## 2018-08-08 NOTE — Telephone Encounter (Signed)
Ucall, he could try PT if he would like .

## 2018-08-08 NOTE — Telephone Encounter (Signed)
Patient has appt with you on 08/18/2018. Is there anything else that you can recommend until appt?

## 2018-08-08 NOTE — Telephone Encounter (Signed)
Patient called advised he is having a lot of pain in his right shoulder and it's moving down his arm. Patient also said he has tingling in his fingers. Patient said the Flexeril is not working. The number to contact patient is [phone]

## 2018-08-10 ENCOUNTER — Encounter (HOSPITAL_COMMUNITY): Payer: Self-pay | Admitting: Emergency Medicine

## 2018-08-10 ENCOUNTER — Emergency Department (HOSPITAL_COMMUNITY)
Admission: EM | Admit: 2018-08-10 | Discharge: 2018-08-10 | Disposition: A | Discharge status: Home / Self Care | Payer: BLUE CROSS/BLUE SHIELD | Attending: Emergency Medicine | Admitting: Emergency Medicine

## 2018-08-10 ENCOUNTER — Emergency Department (HOSPITAL_COMMUNITY): Payer: BLUE CROSS/BLUE SHIELD

## 2018-08-10 ENCOUNTER — Other Ambulatory Visit: Payer: Self-pay

## 2018-08-10 DIAGNOSIS — I10 Essential (primary) hypertension: Secondary | ICD-10-CM | POA: Diagnosis not present

## 2018-08-10 DIAGNOSIS — R200 Anesthesia of skin: Secondary | ICD-10-CM

## 2018-08-10 DIAGNOSIS — Z87891 Personal history of nicotine dependence: Secondary | ICD-10-CM | POA: Diagnosis not present

## 2018-08-10 DIAGNOSIS — Z79899 Other long term (current) drug therapy: Secondary | ICD-10-CM | POA: Diagnosis not present

## 2018-08-10 DIAGNOSIS — M542 Cervicalgia: Secondary | ICD-10-CM

## 2018-08-10 DIAGNOSIS — R531 Weakness: Secondary | ICD-10-CM | POA: Diagnosis not present

## 2018-08-10 MED ORDER — METHYLPREDNISOLONE 4 MG PO TBPK: ORAL_TABLET | ORAL | 0 refills | Status: DC

## 2018-08-10 MED ORDER — MORPHINE SULFATE 15 MG PO TABS: 15.0000 mg | ORAL_TABLET | ORAL | 0 refills | Status: DC | PRN

## 2018-08-10 NOTE — Discharge Instructions (Addendum)
Call your orthopedist first thing in the morning.  They should see you sometime tomorrow and evaluate your MRI and talk with you about what should occur next.  They want me to start you on a steroid Dosepak which I have written prescription to your pharmacy.  Take 4 over the counter ibuprofen tablets 3 times a day or 2 over-the-counter naproxen tablets twice a day for pain. Also take tylenol 1000mg (2 extra strength) four times a day.   Then take the pain medicine if you feel like you need it. Narcotics do not help with the pain, they only make you care about it less.  You can become addicted to this, people may break into your house to steal it.  It will constipate you.  If you drive under the influence of this medicine you can get a DUI.

## 2018-08-10 NOTE — ED Notes (Signed)
Pt ambulatory to the chair, no dizziness or difficulty walking. Charge RN and EDP aware patient is here.

## 2018-08-10 NOTE — ED Notes (Signed)
Pt assessed and transferred by [facility] EDPA.  Pt is transferred to [facility] ED by POV.  Pt instructed to go to [facility] ED without stops.  Pt verbalized understanding.  No IV access in place.

## 2018-08-10 NOTE — ED Provider Notes (Signed)
54 yo M sent here in transfer from med [facility] for right hand weakness and mid back pain.  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  I discussed this with Dr. August Saucer, recommended starting the patient on a steroid Dosepak and having him call the office tomorrow to schedule an appointment.  The patient as I was discharging him told me that the tramadol was not helping him, will prescribe him 4 tablets of immediately release morphine, cautioned him to not take the morphine with the tramadol.  Drug database reviewed and the patient has had multiple tramadol prescriptions in the past.  No other noted narcotics.   Melene Plan, DO 08/10/18 2115

## 2018-08-10 NOTE — ED Notes (Signed)
MRI contacted bout MRI order placed at [facility] ED.  MRI tech stated new orders needed placed here at [facility] to be able to scan patient.  Orders placed here and patient is on list to be scanned.

## 2018-08-10 NOTE — ED Notes (Signed)
Pt discharged by SORT RN. Given cervical collar

## 2018-08-10 NOTE — ED Triage Notes (Signed)
Pt c/o neck-back pains that radiate down right arm since Sunday causing some weakness in right hand. Pt able to make a fist. Pt reports orthopedic MD wanted him to come here to be seen.

## 2018-08-10 NOTE — ED Notes (Signed)
Patient verbalizes understanding of discharge instructions. Opportunity for questioning and answers were provided. Armband removed by staff, pt discharged from ED ambulatory.   

## 2018-08-10 NOTE — ED Provider Notes (Signed)
[facility]-EMERGENCY DEPT Provider Note   CSN: [id number] Arrival date & time: 08/10/18  1559    History   Chief Complaint Chief Complaint  Patient presents with  . Neck Pain  . Extremity Weakness    right arm     HPI JERRION ARANA is a 53 y.o. male.     The history is provided by the patient and medical records. No language interpreter was used.   NEEDHAM MACGOWAN is a 53 y.o. male  with a PMH of previous C5-7 fusion in 2004 who presents to the Emergency Department complaining of neck pain radiating down his right arm for the last 4 days.  Yesterday, he developed numbness to his entire right hand and feels as if his right hand is weak as well.  He has had difficulty grabbing things which is new for him.  He reports at onset, he pushed down to lift himself from a sitting position to a standing position and felt a tweak in his neck and shoulder.  He believes this is the inciting injury, but is not completely sure.  He has been taking tramadol and Flexeril at home with some improvement in the pain.  He called the orthopedic doctor's office today who recommended he come to the emergency department given the numbness and weakness in his hand.  Past Medical History:  Diagnosis Date  . Arthritis   . Hypertension   . Penile lesion     Patient Active Problem List   Diagnosis Date Noted  . Chronic pain of left knee 02/07/2017  . Fall -multiple falls in the past year. 08/29/2016  . Acute pain of right knee 08/29/2016  . Contusion of right hand 07/14/2016  . Ganglion of flexor tendon sheath of right middle finger 06/02/2016  . Impingement syndrome of left shoulder 04/20/2016    Past Surgical History:  Procedure Laterality Date  . ANTERIOR CERVICAL DECOMP/DISCECTOMY FUSION  12-31-2002   C5 -- C7  . CYST EXCISION     pain in r/hand-middle finger  . HAND SURGERY    . PENILE BIOPSY N/A 08/23/2012   Procedure: EXCISIONAL BIOPSY OF PENILE LESION;  Surgeon:  Milford Cage, MD;  Location: [facility];  Service: Urology;  Laterality: N/A;  EXCISIONAL BIOPSY OF PENILE LESION    . POSTERIOR FUSION CERVICAL SPINE  02-26-2005   C5 -- C7        Home Medications    Prior to Admission medications   Medication Sig Start Date End Date Taking? Authorizing Provider  acetaminophen (TYLENOL) 500 MG tablet Take 1,000 mg by mouth every 6 (six) hours as needed for moderate pain.    [provider]  cyclobenzaprine (FLEXERIL) 10 MG tablet 5mg  po q 12 06/30/18   Dean, Corrie Mckusick, MD  predniSONE (DELTASONE) 10 MG tablet Take 4 tablets (40 mg total) by mouth daily with breakfast. 06/24/18   Janne Napoleon, NP  traMADol (ULTRAM) 50 MG tablet Take 1 tablet (50 mg total) by mouth at bedtime as needed. 06/24/18   Janne Napoleon, NP    Family History Family History  Problem Relation Age of Onset  . Hypertension Mother   . Diabetes Mother   . Hypertension Father   . Cancer Father     Social History Social History   Tobacco Use  . Smoking status: Former Smoker    Packs/day: 3.00    Years: 21.00    Pack years: 63.00    Types:  Cigarettes    Last attempt to quit: 08/18/2002    Years since quitting: 15.9  . Smokeless tobacco: Former Engineer, waterUser  Substance Use Topics  . Alcohol use: Yes    Comment: RARE  . Drug use: No     Allergies   Penicillins and Sulfa antibiotics   Review of Systems Review of Systems  Musculoskeletal: Positive for arthralgias and myalgias.  Neurological: Positive for weakness and numbness.  All other systems reviewed and are negative.    Physical Exam Updated Vital Signs BP 138/79 (BP Location: Left Arm)   Pulse (!) 105   Temp 98.3 F (36.8 C) (Oral)   Resp 20   SpO2 97%   Physical Exam Vitals signs and nursing note reviewed.  Constitutional:      General: He is not in acute distress.    Appearance: He is well-developed.  HENT:     Head: Normocephalic and atraumatic.  Neck:      Musculoskeletal: Neck supple.     Comments: No midline cervical tenderness. Cardiovascular:     Heart sounds: Normal heart sounds. No murmur.     Comments: RRR on exam. Pulmonary:     Effort: Pulmonary effort is normal. No respiratory distress.     Breath sounds: Normal breath sounds.  Abdominal:     General: There is no distension.     Palpations: Abdomen is soft.     Tenderness: There is no abdominal tenderness.  Musculoskeletal:     Comments: Strong grip strength bilaterally, but right does seem a little weaker than left. Unable to keep thumb up against resistance. Unable to keep paper pinched tight between thumb and index finger. 2+ radial pulse. Sensation intact, but he does feel decreased sensation to the right pinkie finger compared to left.   Skin:    General: Skin is warm and dry.  Neurological:     Mental Status: He is alert and oriented to person, place, and time.      ED Treatments / Results  Labs (all labs ordered are listed, but only abnormal results are displayed) Labs Reviewed - No data to display  EKG None  Radiology No results found.  Procedures Procedures (including critical care time)  Medications Ordered in ED Medications - No data to display   Initial Impression / Assessment and Plan / ED Course  I have reviewed the triage vital signs and the nursing notes.  Pertinent labs & imaging results that were available during my care of the patient were reviewed by me and considered in my medical decision making (see chart for details).       Heriberto AntiguaRonald D Marcus is a 53 y.o. male who presents to ED for right sided neck pain for the last 4 days associated with right hand numbness and weakness.  Followed by [facility].  On exam, patient does have difficulty keeping his thumb up against resistance and difficulty with pincer grasp.  Unfortunately, MRI not available at this facility today.  I discussed the case and lack of imaging modality with on-call  orthopedics, Dr. August Saucerean, who does feel MRI is needed today for further evaluation given weakness on exam and history of previous neck surgery.  [facility]comm[facility].  [facility] Dr. Rush Landmarkegeler who accepts patient in transfer.  Discussed reasons for medical transport with the patient, including the concerns of right hand weakness & his safety driving.  He refuses medical transport and will only go if  he arrives POV.  Given that he refuses to go by medical transport, recommended he go straight to the [facility] emergency department without any stops as safely as possible.   Patient discussed with Dr. Deretha Emory who agrees with treatment plan.   Final Clinical Impressions(s) / ED Diagnoses   Final diagnoses:  Weakness  Neck pain  Hand numbness    ED Discharge Orders    None       Ward, Chase Picket, PA-C 08/10/18 Flossie Buffy    Vanetta Mulders, MD 08/13/18 1401

## 2018-08-11 ENCOUNTER — Telehealth (INDEPENDENT_AMBULATORY_CARE_PROVIDER_SITE_OTHER): Payer: Self-pay

## 2018-08-11 ENCOUNTER — Ambulatory Visit (INDEPENDENT_AMBULATORY_CARE_PROVIDER_SITE_OTHER): Payer: BLUE CROSS/BLUE SHIELD | Admitting: Orthopaedic Surgery

## 2018-08-11 ENCOUNTER — Encounter (INDEPENDENT_AMBULATORY_CARE_PROVIDER_SITE_OTHER): Payer: Self-pay | Admitting: Orthopaedic Surgery

## 2018-08-11 VITALS — BP 151/101 | HR 74 | Ht 70.0 in | Wt 286.0 lb

## 2018-08-11 DIAGNOSIS — G5631 Lesion of radial nerve, right upper limb: Secondary | ICD-10-CM | POA: Diagnosis not present

## 2018-08-11 DIAGNOSIS — R200 Anesthesia of skin: Secondary | ICD-10-CM | POA: Diagnosis not present

## 2018-08-11 NOTE — Telephone Encounter (Signed)
Patient called stating that Dr August Saucer wants him to come in today and be seen by Dr Ophelia Charter for ER f/u form yesterday. Pt is c/o of neck, shoulders and hands w/numbness. Pt was scheduled in for today at 3 pm.

## 2018-08-11 NOTE — Progress Notes (Signed)
Office Visit Note   Patient: Mike Watkins           Date of Birth: 1965-08-02           MRN: [id number] Visit Date: 08/11/2018              Requested by: No referring provider defined for this encounter. PCP: Patient, No Pcp Per   Assessment & Plan: Visit Diagnoses:  1. Right upper extremity numbness   2. 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  Will obtain EMGs nerve conduction velocities evaluating for possible radial nerve problem mid triceps level.  Patient has some tenderness at the mid triceps level over the radial nerve groove.  Office follow-up after EMGs nerve conduction velocities.  Follow-Up Instructions: No follow-ups on file.   Orders:  Orders Placed This Encounter  Procedures  . Ambulatory referral to Physical Medicine Rehab   No orders of the defined types were placed in this encounter.     Procedures: No procedures performed   Clinical Data: No additional findings.   Subjective: Chief Complaint  Patient presents with  . Neck - Pain    HPI 53 year old male returns she had previous cervical spine surgery anterior and posterior with solid fusion C5-C7.  He has had problems with the right hand numbness that is just started recently and woke up with weakness on Sunday AM  with inability to extend long finger ring and small finger.  He has some extension of the index finger EIP but not EDC.  He was given morphine in the emergency room 15 mg 4 tablets.  Past history of narcotic pain medication usage off and on.  Patient states he has had numbness problems extending his wrist with a burning sensation.  ECRL and V take some resistance extensor carpi ulnaris he is not able to demonstrate firing and resistance.  Dorsal interosseous is not working.  First dorsal osseous and abductor digiti quinti.  Patient's had ED visits most  frequently 08/10/2018 with neck pain pain between his scapula and weakness with right wrist extension and finger extension.  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  Review of Systems patient had problems with impingement left shoulder previous anterior cervical fusions chronic knee pain.  Positive for facial tic, previous cervical fusion was in 2006 and 2004.  Otherwise negative is obtains HPI.   Objective: Vital Signs: BP (!) 151/101   Pulse 74   Ht 5\' 10"  (1.778 m)   Wt 286 lb (129.7 kg)   BMI 41.04 kg/m   Physical Exam Constitutional:      Appearance: He is well-developed.  HENT:     Head: Normocephalic and atraumatic.  Eyes:     Pupils: Pupils are equal, round, and reactive to light.  Neck:     Thyroid: No thyromegaly.     Trachea: No tracheal deviation.  Cardiovascular:     Rate and Rhythm: Normal rate.  Pulmonary:     Effort: Pulmonary effort is normal.     Breath sounds: No wheezing.  Abdominal:     General: Bowel sounds are normal.     Palpations: Abdomen is soft.  Skin:    General: Skin is warm and dry.     Capillary Refill: Capillary refill takes less than 2 seconds.  Neurological:     Mental Status: He  is alert and oriented to person, place, and time.  Psychiatric:        Behavior: Behavior normal.        Thought Content: Thought content normal.        Judgment: Judgment normal.     Ortho Exam patient has some brachial plexus tenderness well-healed anterior posterior cervical incisions.  Negative Spurling.  Patient is not able to extend his wrist except in the radial direction.  No resistance with ECU testing.  He has EIP but no EDC function for index through small finger.  He does have EPL function with thumb extension.  Wrist flexion is good interossei is weak.  Shoulder is stable no history of shoulder dislocation.  He has intact sensation to his hand but complains of some some mild numbness. Patient has weakness of right  triceps 1/2-1 grade  compared to the opposite left triceps was test normal.  Reflexes biceps triceps brachial radialis are all trace. Specialty Comments:  No specialty comments available.  Imaging: No results found.   PMFS History: Patient Active Problem List   Diagnosis Date Noted  . Right upper extremity numbness 08/14/2018  . Chronic pain of left knee 02/07/2017  . Fall -multiple falls in the past year. 08/29/2016  . Acute pain of right knee 08/29/2016  . Contusion of right hand 07/14/2016  . Ganglion of flexor tendon sheath of right middle finger 06/02/2016  . Impingement syndrome of left shoulder 04/20/2016   Past Medical History:  Diagnosis Date  . Arthritis   . Hypertension   . Penile lesion     Family History  Problem Relation Age of Onset  . Hypertension Mother   . Diabetes Mother   . Hypertension Father   . Cancer Father     Past Surgical History:  Procedure Laterality Date  . ANTERIOR CERVICAL DECOMP/DISCECTOMY FUSION  12-31-2002   C5 -- C7  . CYST EXCISION     pain in r/hand-middle finger  . HAND SURGERY    . PENILE BIOPSY N/A 08/23/2012   Procedure: EXCISIONAL BIOPSY OF PENILE LESION;  Surgeon: Milford Cage, MD;  Location: [facility];  Service: Urology;  Laterality: N/A;  EXCISIONAL BIOPSY OF PENILE LESION    . POSTERIOR FUSION CERVICAL SPINE  02-26-2005   C5 -- C7   Social History   Occupational History  . Not on file  Tobacco Use  . Smoking status: Former Smoker    Packs/day: 3.00    Years: 21.00    Pack years: 63.00    Types: Cigarettes    Last attempt to quit: 08/18/2002    Years since quitting: 16.0  . Smokeless tobacco: Former Engineer, water and Sexual Activity  . Alcohol use: Yes    Comment: RARE  . Drug use: No  . Sexual activity: Not on file

## 2018-08-14 ENCOUNTER — Encounter (INDEPENDENT_AMBULATORY_CARE_PROVIDER_SITE_OTHER): Payer: Self-pay | Admitting: Orthopaedic Surgery

## 2018-08-14 DIAGNOSIS — R200 Anesthesia of skin: Secondary | ICD-10-CM | POA: Insufficient documentation

## 2018-08-18 ENCOUNTER — Ambulatory Visit (INDEPENDENT_AMBULATORY_CARE_PROVIDER_SITE_OTHER): Payer: Self-pay | Admitting: Orthopaedic Surgery

## 2018-09-01 ENCOUNTER — Telehealth (INDEPENDENT_AMBULATORY_CARE_PROVIDER_SITE_OTHER): Payer: Self-pay | Admitting: *Deleted

## 2018-09-01 MED ORDER — CYCLOBENZAPRINE HCL 10 MG PO TABS: ORAL_TABLET | ORAL | 0 refills | Status: DC

## 2018-09-01 NOTE — Telephone Encounter (Signed)
Sent to the pharmacy

## 2018-09-01 NOTE — Telephone Encounter (Signed)
I called patient and advised. He states appt with Dr. Alvester Morin has been rescheduled to 10/03/2018.  He will follow up with Dr. Ophelia Charter after that appointment.

## 2018-09-01 NOTE — Telephone Encounter (Signed)
OK - thanks

## 2018-09-01 NOTE — Telephone Encounter (Signed)
Please advise 

## 2018-09-06 ENCOUNTER — Encounter (INDEPENDENT_AMBULATORY_CARE_PROVIDER_SITE_OTHER): Payer: Self-pay | Admitting: Physical Medicine and Rehabilitation

## 2018-10-03 ENCOUNTER — Encounter (INDEPENDENT_AMBULATORY_CARE_PROVIDER_SITE_OTHER): Payer: Self-pay | Admitting: Physical Medicine and Rehabilitation

## 2018-10-17 ENCOUNTER — Encounter (INDEPENDENT_AMBULATORY_CARE_PROVIDER_SITE_OTHER): Payer: Self-pay | Admitting: Physical Medicine and Rehabilitation

## 2018-11-09 ENCOUNTER — Encounter: Payer: Self-pay | Admitting: Physical Medicine and Rehabilitation

## 2018-11-09 ENCOUNTER — Telehealth: Payer: Self-pay | Admitting: Radiology

## 2018-11-09 NOTE — Telephone Encounter (Signed)
Called to change appointment date for nerve conduction study.  Patient is requesting tramadol for pain, pain in right arm and hand. Trouble sleeping at night due to pain. [facility] on [address]  cb # [phone]

## 2018-11-13 NOTE — Telephone Encounter (Signed)
This would ned to go through Dr. Ophelia Charter if possible since we are just doing his test

## 2018-11-14 MED ORDER — TRAMADOL HCL 50 MG PO TABS: 50.0000 mg | ORAL_TABLET | Freq: Every evening | ORAL | 0 refills | Status: DC | PRN

## 2018-11-14 NOTE — Telephone Encounter (Signed)
Ok for # 10 tablets one po q HS prn pain . No refills .   thanks

## 2018-11-14 NOTE — Telephone Encounter (Signed)
Called to pharmacy. I left voicemail for patient advising. °

## 2018-11-14 NOTE — Telephone Encounter (Signed)
Please see below and advise.

## 2018-11-30 ENCOUNTER — Encounter: Payer: BLUE CROSS/BLUE SHIELD | Admitting: Physical Medicine and Rehabilitation

## 2019-01-07 IMAGING — MR MR KNEE*L* W/O CM
6 series · 40 of 40 positions shown · non-contrast
Comparison: Left knee x-rays dated August 19, 2017.

CLINICAL DATA: Chronic left knee pain, worsening over the past
several months.

EXAM:
MRI OF THE LEFT KNEE WITHOUT CONTRAST
TECHNIQUE: Multiplanar, multisequence MR imaging of the knee was performed. No
intravenous contrast was administered.

[Series 3: PD fat-sat · axial · 4.0mm · 0.66mm/px · z∈[-71,+49]mm · 7 of 25 slices shown (1 of 3)]
[im 1/25]
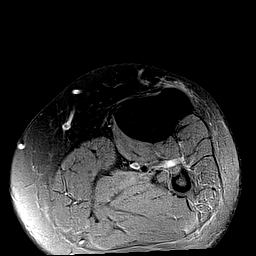
[im 5/25]
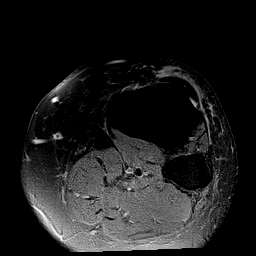
[im 9/25]
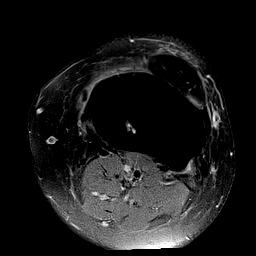
[im 13/25]
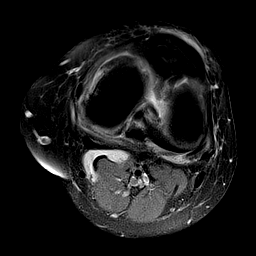
[im 17/25]
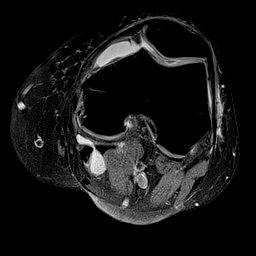
[im 21/25]
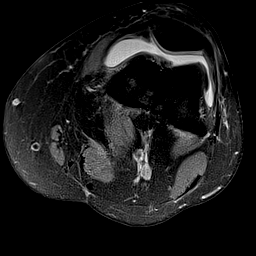
[im 25/25]
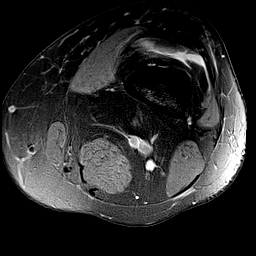

[Series 4: PD fat-sat · coronal · 4.0mm · 0.62mm/px · 7 of 26 slices shown (2 of 3)]
[im 1/26]
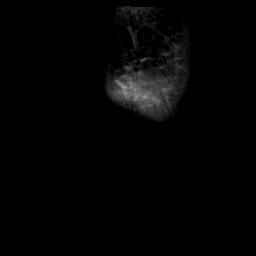
[im 5/26]
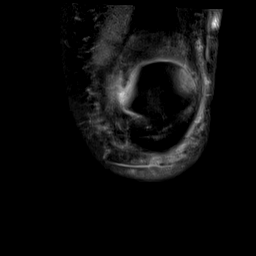
[im 9/26]
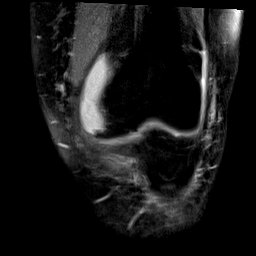
[im 13/26]
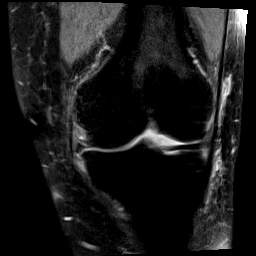
[im 17/26]
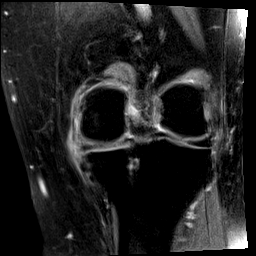
[im 21/26]
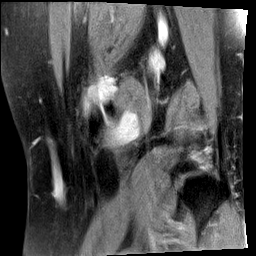
[im 26/26]
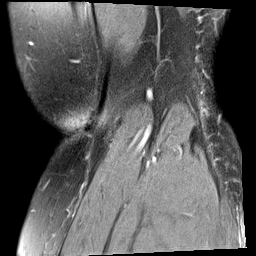

[Series 5: T2 fat-sat · coronal · 4.0mm · 0.62mm/px · 7 of 26 slices shown]
[im 1/26]
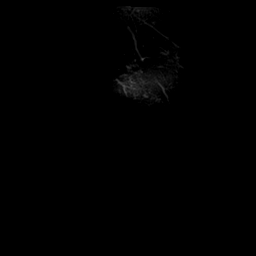
[im 5/26]
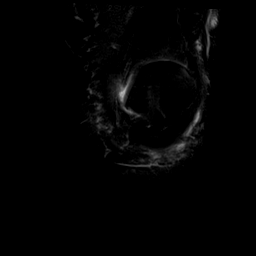
[im 9/26]
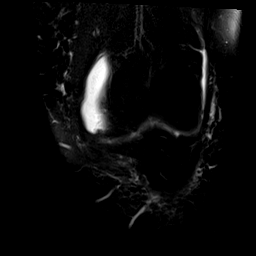
[im 13/26]
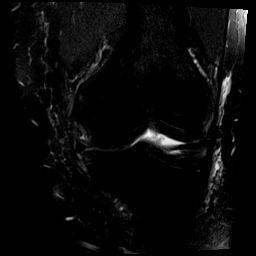
[im 17/26]
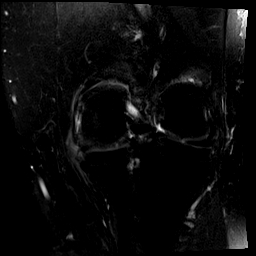
[im 21/26]
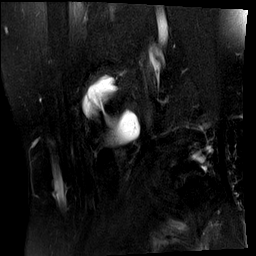
[im 26/26]
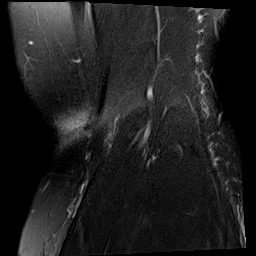

[Series 6: T1 · coronal · 4.0mm · 0.62mm/px · 7 of 26 slices shown]
[im 1/26]
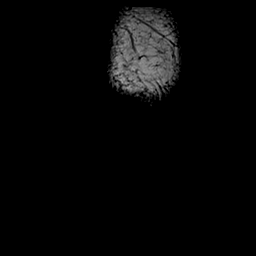
[im 5/26]
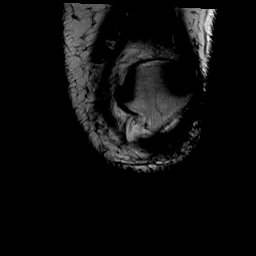
[im 9/26]
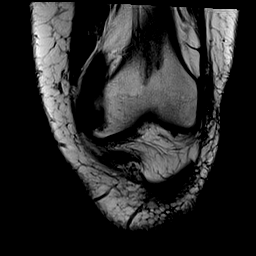
[im 13/26]
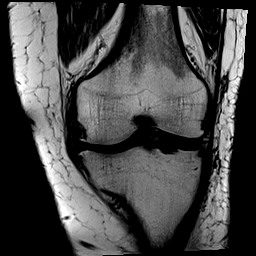
[im 17/26]
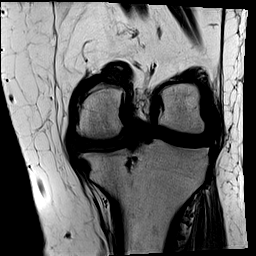
[im 21/26]
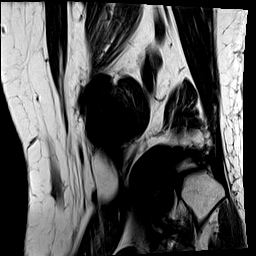
[im 26/26]
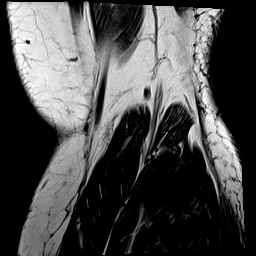

[Series 7: PD fat-sat · sagittal · 4.0mm · 0.62mm/px · 7 of 24 slices shown (3 of 3)]
[im 1/24]
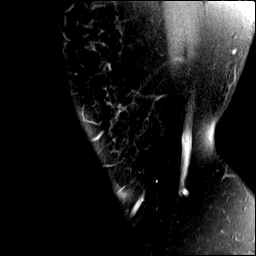
[im 4/24]
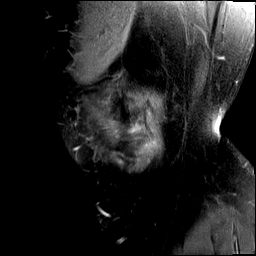
[im 8/24]
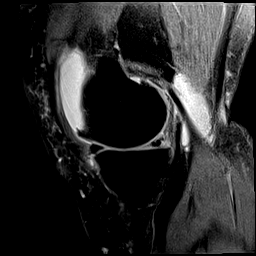
[im 12/24]
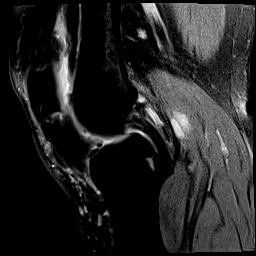
[im 16/24]
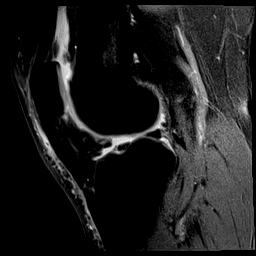
[im 20/24]
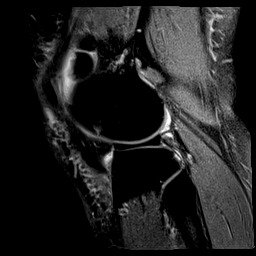
[im 24/24]
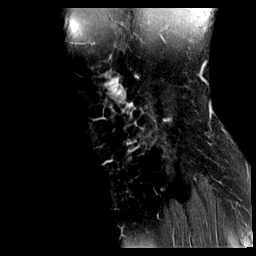

[Series 9: PD · coronal · 2.5mm · 0.55mm/px · 5 of 18 slices shown]
[im 1/18]
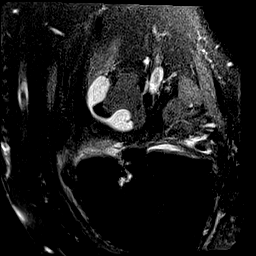
[im 5/18]
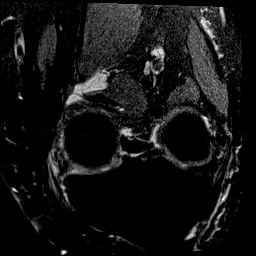
[im 9/18]
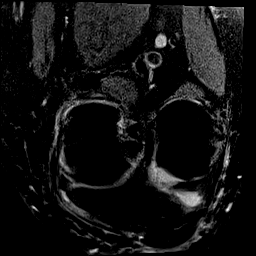
[im 13/18]
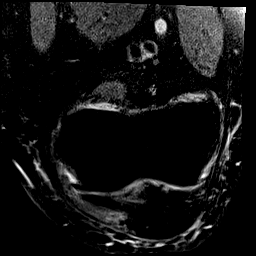
[im 18/18]
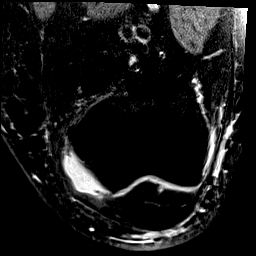

[40 of 40 positions shown; findings below may reference images not displayed]

FINDINGS: MENISCI

Medial meniscus:  Complex tear of the body and posterior horn.

Lateral meniscus:  Intact.

LIGAMENTS

Cruciates:  Intact ACL and PCL.

Collaterals: Medial collateral ligament is intact. Lateral
collateral ligament complex is intact.

CARTILAGE

Patellofemoral: Focal 4 mm near full-thickness cartilage defect over
the lateral patellar facet partial-thickness cartilage loss over the
medial trochlea with underlying subchondral marrow edema.

Medial: Diffuse partial thickness cartilage loss with focal near
full-thickness defect over the mesial aspect of the central
weight-bearing medial femoral condyle.

Lateral:  Mild diffuse cartilage thinning.

Joint: Small joint effusion. Normal Hoffa's fat. No plical
thickening.

Popliteal Fossa:  Small Baker cyst.  Intact popliteus tendon.

Extensor Mechanism: Intact quadriceps tendon and patellar tendon.
Intact medial and lateral patellar retinaculum. Intact MPFL.

Bones: Small tricompartmental osteophytes. No focal bone lesion. No
acute fracture or dislocation.

Other: Mild prepatellar soft tissue edema.
IMPRESSION: 1. Complex tear of the medial meniscus body and posterior horn.
2. Mild tricompartmental osteoarthritis.
3. Small joint effusion and Baker cyst.

## 2019-12-05 ENCOUNTER — Telehealth: Payer: Self-pay | Admitting: Orthopedic Surgery

## 2019-12-05 NOTE — Telephone Encounter (Signed)
Patient called.   Hasn't been seen by Dr.Dean since 2019 and doesn't want to be seen. However is requesting a call back for advice on choosing a knee brace.   Call back [phone]

## 2019-12-05 NOTE — Telephone Encounter (Signed)
Please advise. Patient not seen in almost 2 years. I spoke with him at length and he would like to know what you suggest stated that his knee is not better and his hinged knee brace that we had previously provided to him does not fit well anymore. He would like to know what other bracing you recommend for patient?  For insurance purposes if he receives anything from [place] as far as DME, he will need ROV first.

## 2019-12-06 NOTE — Telephone Encounter (Signed)
I s/w patient and advised. He will hold off for now and think about his options. He will let us know how he wishes to proceed.

## 2019-12-06 NOTE — Telephone Encounter (Signed)
His arthritis is severe and end-stage.  Unlikely that any bracing is going to make a meaningful impact on his knee.  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  However that is really his only option.  In regards to bracing.

## 2020-04-22 ENCOUNTER — Ambulatory Visit (INDEPENDENT_AMBULATORY_CARE_PROVIDER_SITE_OTHER): Payer: BC Managed Care – PPO

## 2020-04-22 ENCOUNTER — Encounter (HOSPITAL_COMMUNITY): Payer: Self-pay | Admitting: Emergency Medicine

## 2020-04-22 ENCOUNTER — Ambulatory Visit (HOSPITAL_COMMUNITY)
Admission: EM | Admit: 2020-04-22 | Discharge: 2020-04-22 | Disposition: A | Discharge status: Home / Self Care | Payer: BC Managed Care – PPO | Attending: Urgent Care | Admitting: Urgent Care

## 2020-04-22 ENCOUNTER — Other Ambulatory Visit: Payer: Self-pay

## 2020-04-22 DIAGNOSIS — Z20822 Contact with and (suspected) exposure to covid-19: Secondary | ICD-10-CM | POA: Diagnosis not present

## 2020-04-22 DIAGNOSIS — J069 Acute upper respiratory infection, unspecified: Secondary | ICD-10-CM | POA: Insufficient documentation

## 2020-04-22 DIAGNOSIS — R059 Cough, unspecified: Secondary | ICD-10-CM | POA: Diagnosis not present

## 2020-04-22 DIAGNOSIS — Z87891 Personal history of nicotine dependence: Secondary | ICD-10-CM | POA: Insufficient documentation

## 2020-04-22 DIAGNOSIS — H6121 Impacted cerumen, right ear: Secondary | ICD-10-CM | POA: Diagnosis not present

## 2020-04-22 LAB — SARS CORONAVIRUS 2 (TAT 6-24 HRS): SARS Coronavirus 2: NEGATIVE

## 2020-04-22 MED ORDER — ALBUTEROL SULFATE HFA 108 (90 BASE) MCG/ACT IN AERS
1.0000 | INHALATION_SPRAY | Freq: Four times a day (QID) | RESPIRATORY_TRACT | 0 refills | Status: DC | PRN
Start: 2020-04-22 — End: 2022-05-31

## 2020-04-22 NOTE — Discharge Instructions (Signed)

## 2020-04-22 NOTE — ED Provider Notes (Addendum)
[facility]   MRN: [id number] DOB: [birth year]  Subjective:   SOLAN VOSLER is a 54 y.o. male presenting for 4-day history of malaise and fatigue, cough, throat pain.  Patient has been Covid vaccinated.  Denies smoking.  Denies history of asthma, COPD.  Has used over-the-counter medications with some relief.  Denies fever, loss of sense of taste and smell, chest pain, shortness of breath, wheezing.  No current facility-administered medications for this encounter.  Current Outpatient Medications:  .  traMADol (ULTRAM) 50 MG tablet, Take 1 tablet (50 mg total) by mouth at bedtime as needed., Disp: 10 tablet, Rfl: 0 .  acetaminophen (TYLENOL) 500 MG tablet, Take 1,000 mg by mouth every 6 (six) hours as needed for moderate pain., Disp: , Rfl:  .  morphine (MSIR) 15 MG tablet, Take 1 tablet (15 mg total) by mouth every 4 (four) hours as needed for severe pain., Disp: 4 tablet, Rfl: 0   Allergies  Allergen Reactions  . Penicillins Shortness Of Breath    Has patient had a PCN reaction causing immediate rash, facial/tongue/throat swelling, SOB or lightheadedness with hypotension: Yes Has patient had a PCN reaction causing severe rash involving mucus membranes or skin necrosis: No Has patient had a PCN reaction that required hospitalization No Has patient had a PCN reaction occurring within the last 10 years: No If all of the above answers are "NO", then may proceed with Cephalosporin use.    . Sulfa Antibiotics Shortness Of Breath    "Hard to breathe."    Past Medical History:  Diagnosis Date  . Arthritis   . Hypertension   . Penile lesion      Past Surgical History:  Procedure Laterality Date  . ANTERIOR CERVICAL DECOMP/DISCECTOMY FUSION  12-31-2002   C5 -- C7  . CYST EXCISION     pain in r/hand-middle finger  . HAND SURGERY    . PENILE BIOPSY N/A 08/23/2012   Procedure: EXCISIONAL BIOPSY OF PENILE LESION;  Surgeon: Milford Cage, MD;  Location:  [facility];  Service: Urology;  Laterality: N/A;  EXCISIONAL BIOPSY OF PENILE LESION    . POSTERIOR FUSION CERVICAL SPINE  02-26-2005   C5 -- C7    Family History  Problem Relation Age of Onset  . Hypertension Mother   . Diabetes Mother   . Hypertension Father   . Cancer Father     Social History   Tobacco Use  . Smoking status: Former Smoker    Packs/day: 3.00    Years: 21.00    Pack years: 63.00    Types: Cigarettes    Quit date: 08/18/2002    Years since quitting: 17.6  . Smokeless tobacco: Former Engineer, water Use Topics  . Alcohol use: Yes    Comment: RARE  . Drug use: No    ROS   Objective:   Vitals: BP (!) 169/96   Pulse 86   Temp 98.1 F (36.7 C) (Oral)   Resp 16   SpO2 95%   Physical Exam Constitutional:      General: He is not in acute distress.    Appearance: Normal appearance. He is well-developed. He is obese. He is not ill-appearing, toxic-appearing or diaphoretic.  HENT:     Head: Normocephalic and atraumatic.     Right Ear: External ear normal. There is impacted cerumen.     Left Ear: External ear normal.     Nose: Nose normal.  Mouth/Throat:     Mouth: Mucous membranes are moist.     Pharynx: Oropharynx is clear.  Eyes:     General: No scleral icterus.    Extraocular Movements: Extraocular movements intact.     Pupils: Pupils are equal, round, and reactive to light.  Cardiovascular:     Rate and Rhythm: Normal rate and regular rhythm.     Heart sounds: Normal heart sounds. No murmur heard.  No friction rub. No gallop.   Pulmonary:     Effort: Pulmonary effort is normal. No respiratory distress.     Breath sounds: No stridor. Decreased breath sounds present. No wheezing, rhonchi or rales.  Neurological:     Mental Status: He is alert and oriented to person, place, and time.  Psychiatric:        Mood and Affect: Mood normal.        Behavior: Behavior normal.        Thought Content: Thought content normal.      DG Chest 2 View  Result Date: 04/22/2020 CLINICAL DATA:  Cough EXAM: CHEST - 2 VIEW COMPARISON:  09/13/2015 FINDINGS: Normal heart size and mediastinal contours. No acute infiltrate or edema. No effusion or pneumothorax. No acute osseous findings. Cervical spine fusion. IMPRESSION: No active cardiopulmonary disease. Electronically Signed   By: Marnee Spring M.D.   On: 04/22/2020 11:00   Ear lavage performed using mixture of peroxide and water.  Pressure irrigation performed using a bottle and a thin ear tube.  Right ear lavage.  No curette was used.  Assessment and Plan :   PDMP not reviewed this encounter.  1. Viral URI with cough   2. Impacted cerumen of right ear     Will manage for viral illness such as viral URI, viral syndrome, viral rhinitis, COVID-19. Counseled patient on nature of COVID-19 including modes of transmission, diagnostic testing, management and supportive care.  Offered scripts for symptomatic relief.  Patient actually reported that someone has told him before that he has asthma but does not have an official diagnosis.  He states he would like an albuterol inhaler as he feels some tightness.  Does have a history of smoking but quit.  COVID 19 testing is pending.  Ear lavage successful.  Counseled patient on potential for adverse effects with medications prescribed/recommended today, ER and return-to-clinic precautions discussed, patient verbalized understanding.      Wallis Bamberg, PA-C 04/22/20 1304

## 2020-04-22 NOTE — ED Triage Notes (Signed)
PT reports sore throat, not feeling well, cough that started Saturday.

## 2020-05-30 ENCOUNTER — Ambulatory Visit (HOSPITAL_COMMUNITY)
Admission: EM | Admit: 2020-05-30 | Discharge: 2020-05-30 | Disposition: A | Discharge status: Home / Self Care | Payer: BC Managed Care – PPO | Attending: Emergency Medicine | Admitting: Emergency Medicine

## 2020-05-30 ENCOUNTER — Encounter (HOSPITAL_COMMUNITY): Payer: Self-pay

## 2020-05-30 ENCOUNTER — Other Ambulatory Visit: Payer: Self-pay

## 2020-05-30 DIAGNOSIS — M7542 Impingement syndrome of left shoulder: Secondary | ICD-10-CM | POA: Diagnosis not present

## 2020-05-30 DIAGNOSIS — R296 Repeated falls: Secondary | ICD-10-CM | POA: Diagnosis not present

## 2020-05-30 DIAGNOSIS — U071 COVID-19: Secondary | ICD-10-CM | POA: Insufficient documentation

## 2020-05-30 DIAGNOSIS — M25562 Pain in left knee: Secondary | ICD-10-CM | POA: Insufficient documentation

## 2020-05-30 DIAGNOSIS — J069 Acute upper respiratory infection, unspecified: Secondary | ICD-10-CM | POA: Insufficient documentation

## 2020-05-30 DIAGNOSIS — R0982 Postnasal drip: Secondary | ICD-10-CM | POA: Insufficient documentation

## 2020-05-30 DIAGNOSIS — G8929 Other chronic pain: Secondary | ICD-10-CM | POA: Diagnosis not present

## 2020-05-30 DIAGNOSIS — Z87891 Personal history of nicotine dependence: Secondary | ICD-10-CM | POA: Diagnosis not present

## 2020-05-30 DIAGNOSIS — R059 Cough, unspecified: Secondary | ICD-10-CM | POA: Insufficient documentation

## 2020-05-30 LAB — RESP PANEL BY RT-PCR (FLU A&B, COVID) ARPGX2
Influenza A by PCR: NEGATIVE
Influenza B by PCR: NEGATIVE
SARS Coronavirus 2 by RT PCR: POSITIVE — AB

## 2020-05-30 NOTE — ED Provider Notes (Signed)
[facility]    CSN: [id number] Arrival date & time: 05/30/20  1148      History   Chief Complaint Chief Complaint  Patient presents with  . Cough  . Nasal Congestion    HPI Mike Watkins is a 54 y.o. male.   Patient presents with 2-day history of sinus congestion, productive cough, postnasal drip, sneezing, runny nose.  He denies fever, chills, rash, shortness of breath, vomiting, diarrhea, or other symptoms.  No treatment attempted at home.  His medical history includes hypertension, arthritis, chronic pain of left knee, multiple falls, impingement syndrome of left shoulder.  The history is provided by the patient and medical records.    Past Medical History:  Diagnosis Date  . Arthritis   . Hypertension   . Penile lesion     Patient Active Problem List   Diagnosis Date Noted  . Right upper extremity numbness 08/14/2018  . Chronic pain of left knee 02/07/2017  . Fall -multiple falls in the past year. 08/29/2016  . Acute pain of right knee 08/29/2016  . Contusion of right hand 07/14/2016  . Ganglion of flexor tendon sheath of right middle finger 06/02/2016  . Impingement syndrome of left shoulder 04/20/2016    Past Surgical History:  Procedure Laterality Date  . ANTERIOR CERVICAL DECOMP/DISCECTOMY FUSION  12-31-2002   C5 -- C7  . CYST EXCISION     pain in r/hand-middle finger  . HAND SURGERY    . PENILE BIOPSY N/A 08/23/2012   Procedure: EXCISIONAL BIOPSY OF PENILE LESION;  Surgeon: Milford Cage, MD;  Location: [facility];  Service: Urology;  Laterality: N/A;  EXCISIONAL BIOPSY OF PENILE LESION    . POSTERIOR FUSION CERVICAL SPINE  02-26-2005   C5 -- C7       Home Medications    Prior to Admission medications   Medication Sig Start Date End Date Taking? Authorizing Provider  acetaminophen (TYLENOL) 500 MG tablet Take 1,000 mg by mouth every 6 (six) hours as needed for moderate pain.    [provider]   albuterol (VENTOLIN HFA) 108 (90 Base) MCG/ACT inhaler Inhale 1-2 puffs into the lungs every 6 (six) hours as needed for wheezing or shortness of breath. 04/22/20   Wallis Bamberg, PA-C  morphine (MSIR) 15 MG tablet Take 1 tablet (15 mg total) by mouth every 4 (four) hours as needed for severe pain. 08/10/18   Melene Plan, DO  traMADol (ULTRAM) 50 MG tablet Take 1 tablet (50 mg total) by mouth at bedtime as needed. 11/14/18   Eldred Manges, MD    Family History Family History  Problem Relation Age of Onset  . Hypertension Mother   . Diabetes Mother   . Hypertension Father   . Cancer Father     Social History Social History   Tobacco Use  . Smoking status: Former Smoker    Packs/day: 3.00    Years: 21.00    Pack years: 63.00    Types: Cigarettes    Quit date: 08/18/2002    Years since quitting: 17.7  . Smokeless tobacco: Former Engineer, water Use Topics  . Alcohol use: Yes    Comment: RARE  . Drug use: No     Allergies   Penicillins and Sulfa antibiotics   Review of Systems Review of Systems  Constitutional: Negative for chills and fever.  HENT: Positive for congestion, postnasal drip, rhinorrhea and sinus pressure. Negative for ear pain and sore throat.  Eyes: Negative for pain and visual disturbance.  Respiratory: Positive for cough. Negative for shortness of breath.   Cardiovascular: Negative for chest pain and palpitations.  Gastrointestinal: Negative for abdominal pain, diarrhea and vomiting.  Genitourinary: Negative for dysuria and hematuria.  Musculoskeletal: Negative for arthralgias and back pain.  Skin: Negative for color change and rash.  Neurological: Negative for seizures and syncope.  All other systems reviewed and are negative.    Physical Exam Triage Vital Signs ED Triage Vitals  Enc Vitals Group     BP      Pulse      Resp      Temp      Temp src      SpO2      Weight      Height      Head Circumference      Peak Flow      Pain Score       Pain Loc      Pain Edu?      Excl. in GC?    No data found.  Updated Vital Signs BP (!) 154/81 (BP Location: Right Arm)   Pulse (!) 106   Temp 98.6 F (37 C) (Oral)   Resp (!) 21   SpO2 99%   Visual Acuity Right Eye Distance:   Left Eye Distance:   Bilateral Distance:    Right Eye Near:   Left Eye Near:    Bilateral Near:     Physical Exam Vitals and nursing note reviewed.  Constitutional:      General: He is not in acute distress.    Appearance: He is well-developed and well-nourished. He is obese. He is not ill-appearing.  HENT:     Head: Normocephalic and atraumatic.     Right Ear: Tympanic membrane normal.     Left Ear: Tympanic membrane normal.     Nose: Nose normal.     Mouth/Throat:     Mouth: Mucous membranes are moist.     Pharynx: Oropharynx is clear.  Eyes:     Conjunctiva/sclera: Conjunctivae normal.  Cardiovascular:     Rate and Rhythm: Normal rate and regular rhythm.     Heart sounds: Normal heart sounds.  Pulmonary:     Effort: Pulmonary effort is normal. No respiratory distress.     Breath sounds: Normal breath sounds.  Abdominal:     Palpations: Abdomen is soft.     Tenderness: There is no abdominal tenderness. There is no guarding or rebound.  Musculoskeletal:        General: No edema.     Cervical back: Neck supple.  Skin:    General: Skin is warm and dry.     Findings: No rash.  Neurological:     General: No focal deficit present.     Mental Status: He is alert and oriented to person, place, and time.     Gait: Gait normal.  Psychiatric:        Mood and Affect: Mood and affect and mood normal.        Behavior: Behavior normal.      UC Treatments / Results  Labs (all labs ordered are listed, but only abnormal results are displayed) Labs Reviewed  RESP PANEL BY RT-PCR (FLU A&B, COVID) [id number]    EKG   Radiology No results found.  Procedures Procedures (including critical care time)  Medications Ordered in UC Medications  - No data to display  Initial Impression / Assessment and Plan /  UC Course  I have reviewed the triage vital signs and the nursing notes.  Pertinent labs & imaging results that were available during my care of the patient were reviewed by me and considered in my medical decision making (see chart for details).   Viral URI.  Influenza and COVID pending.  Instructed patient to self quarantine until the test results are back.  Discussed symptomatic treatment including Tylenol, rest, hydration.  Instructed patient to follow up with PCP if his symptoms are not improving  Patient agrees to plan of care.    Final Clinical Impressions(s) / UC Diagnoses   Final diagnoses:  Viral upper respiratory tract infection     Discharge Instructions     Your COVID and Flu tests are pending.  You should self quarantine until the test results are back.    Take Tylenol or ibuprofen as needed for fever or discomfort.  Rest and keep yourself hydrated.    Follow-up with your primary care provider if your symptoms are not improving.        ED Prescriptions    None     PDMP not reviewed this encounter.   Mickie Bail, NP 05/30/20 1401

## 2020-05-30 NOTE — ED Triage Notes (Signed)
Pt in with c/o productive cough and nasal congestion that has been going on for over 1 week.  States he was seen on the 11/9 and given medicine that helped temporarily but now he's having the same sxs

## 2020-05-30 NOTE — Discharge Instructions (Addendum)
Your COVID and Flu tests are pending.  You should self quarantine until the test results are back.    Take Tylenol or ibuprofen as needed for fever or discomfort.  Rest and keep yourself hydrated.    Follow-up with your primary care provider if your symptoms are not improving.     

## 2020-06-01 ENCOUNTER — Telehealth: Payer: Self-pay | Admitting: Unknown Physician Specialty

## 2020-06-01 ENCOUNTER — Other Ambulatory Visit: Payer: Self-pay | Admitting: Unknown Physician Specialty

## 2020-06-01 DIAGNOSIS — E663 Overweight: Secondary | ICD-10-CM

## 2020-06-01 DIAGNOSIS — U071 COVID-19: Secondary | ICD-10-CM

## 2020-06-01 NOTE — Telephone Encounter (Signed)
Called to Discuss with patient about Covid symptoms and the use of the monoclonal antibody infusion for those with mild to moderate Covid symptoms and at a high risk of hospitalization.     Pt appears to qualify for this infusion due to co-morbid conditions and/or a member of an at-risk group in accordance with the FDA Emergency Use Authorization.    Unable to reach pt   LMOM 

## 2020-06-01 NOTE — Telephone Encounter (Signed)
I connected by phone with Mike Watkins on 06/01/2020 at 2:14 PM to discuss the potential use of a new treatment for mild to moderate COVID-19 viral infection in non-hospitalized patients.  This patient is a 54 y.o. male that meets the FDA criteria for Emergency Use Authorization of COVID monoclonal antibody casirivimab/imdevimab, bamlanivimab/etesevimab, or sotrovimab.  Has a (+) direct SARS-CoV-2 viral test result  Has mild or moderate COVID-19   Is NOT hospitalized due to COVID-19  Is within 10 days of symptom onset  Has at least one of the high risk factor(s) for progression to severe COVID-19 and/or hospitalization as defined in EUA.  Specific high risk criteria : BMI > 25   I have spoken and communicated the following to the patient or parent/caregiver regarding COVID monoclonal antibody treatment:  1. FDA has authorized the emergency use for the treatment of mild to moderate COVID-19 in adults and pediatric patients with positive results of direct SARS-CoV-2 viral testing who are 56 years of age and older weighing at least 40 kg, and who are at high risk for progressing to severe COVID-19 and/or hospitalization.  2. The significant known and potential risks and benefits of COVID monoclonal antibody, and the extent to which such potential risks and benefits are unknown.  3. Information on available alternative treatments and the risks and benefits of those alternatives, including clinical trials.  4. Patients treated with COVID monoclonal antibody should continue to self-isolate and use infection control measures (e.g., wear mask, isolate, social distance, avoid sharing personal items, clean and disinfect "high touch" surfaces, and frequent handwashing) according to CDC guidelines.   5. The patient or parent/caregiver has the option to accept or refuse COVID monoclonal antibody treatment.  After reviewing this information with the patient, the patient has agreed to receive one of  the available covid 19 monoclonal antibodies and will be provided an appropriate fact sheet prior to infusion. Gabriel Cirri, NP 06/01/2020 2:14 PM   Sx onset 12/15

## 2020-06-03 ENCOUNTER — Ambulatory Visit (HOSPITAL_COMMUNITY): Payer: BC Managed Care – PPO

## 2021-02-19 ENCOUNTER — Encounter (HOSPITAL_COMMUNITY): Payer: Self-pay | Admitting: Emergency Medicine

## 2021-02-19 ENCOUNTER — Ambulatory Visit (HOSPITAL_COMMUNITY)
Admission: EM | Admit: 2021-02-19 | Discharge: 2021-02-19 | Disposition: A | Discharge status: Home / Self Care | Payer: BC Managed Care – PPO | Attending: Physician Assistant | Admitting: Physician Assistant

## 2021-02-19 ENCOUNTER — Other Ambulatory Visit: Payer: Self-pay

## 2021-02-19 DIAGNOSIS — L0291 Cutaneous abscess, unspecified: Secondary | ICD-10-CM | POA: Diagnosis not present

## 2021-02-19 MED ORDER — DOXYCYCLINE HYCLATE 100 MG PO CAPS: 100.0000 mg | ORAL_CAPSULE | Freq: Two times a day (BID) | ORAL | 0 refills | Status: DC

## 2021-02-19 NOTE — ED Provider Notes (Signed)
[facility]    CSN: [id number] Arrival date & time: 02/19/21  1641      History   Chief Complaint Chief Complaint  Patient presents with   Insect Bite    HPI Mike Watkins is a 55 y.o. male.   Patient here today for pain and swelling to his lower abdomen.  States he is not sure if he was bit by a spider.  Reports that symptoms seem to be worsening with time.  He denies any fever or chills.  He has had no nausea or vomiting.  Does not report any treatment for symptoms.  The history is provided by the patient.   Past Medical History:  Diagnosis Date   Arthritis    Hypertension    Penile lesion     Patient Active Problem List   Diagnosis Date Noted   Right upper extremity numbness 08/14/2018   Chronic pain of left knee 02/07/2017   Fall -multiple falls in the past year. 08/29/2016   Acute pain of right knee 08/29/2016   Contusion of right hand 07/14/2016   Ganglion of flexor tendon sheath of right middle finger 06/02/2016   Impingement syndrome of left shoulder 04/20/2016    Past Surgical History:  Procedure Laterality Date   ANTERIOR CERVICAL DECOMP/DISCECTOMY FUSION  12-31-2002   C5 -- C7   CYST EXCISION     pain in r/hand-middle finger   HAND SURGERY     PENILE BIOPSY N/A 08/23/2012   Procedure: EXCISIONAL BIOPSY OF PENILE LESION;  Surgeon: Milford Cage, MD;  Location: [facility];  Service: Urology;  Laterality: N/A;  EXCISIONAL BIOPSY OF PENILE LESION     POSTERIOR FUSION CERVICAL SPINE  02-26-2005   C5 -- C7       Home Medications    Prior to Admission medications   Medication Sig Start Date End Date Taking? Authorizing Provider  doxycycline (VIBRAMYCIN) 100 MG capsule Take 1 capsule (100 mg total) by mouth 2 (two) times daily. 02/19/21  Yes Tomi Bamberger, PA-C  acetaminophen (TYLENOL) 500 MG tablet Take 1,000 mg by mouth every 6 (six) hours as needed for moderate pain.    [provider]  albuterol  (VENTOLIN HFA) 108 (90 Base) MCG/ACT inhaler Inhale 1-2 puffs into the lungs every 6 (six) hours as needed for wheezing or shortness of breath. 04/22/20   Wallis Bamberg, PA-C  morphine (MSIR) 15 MG tablet Take 1 tablet (15 mg total) by mouth every 4 (four) hours as needed for severe pain. 08/10/18   Melene Plan, DO  traMADol (ULTRAM) 50 MG tablet Take 1 tablet (50 mg total) by mouth at bedtime as needed. 11/14/18   Eldred Manges, MD    Family History Family History  Problem Relation Age of Onset   Hypertension Mother    Diabetes Mother    Hypertension Father    Cancer Father     Social History Social History   Tobacco Use   Smoking status: Former    Packs/day: 3.00    Years: 21.00    Pack years: 63.00    Types: Cigarettes    Quit date: 08/18/2002    Years since quitting: 18.5   Smokeless tobacco: Former  Substance Use Topics   Alcohol use: Yes    Comment: RARE   Drug use: No     Allergies   Penicillins and Sulfa antibiotics   Review of Systems Review of Systems  Constitutional:  Negative for chills and fever.  Eyes:  Negative for discharge and redness.  Respiratory:  Negative for shortness of breath.   Gastrointestinal:  Negative for nausea and vomiting.  Skin:  Positive for color change and wound.    Physical Exam Triage Vital Signs ED Triage Vitals  Enc Vitals Group     BP 02/19/21 1711 (!) 159/94     Pulse Rate 02/19/21 1711 (!) 112     Resp 02/19/21 1711 20     Temp 02/19/21 1711 98.7 F (37.1 C)     Temp Source 02/19/21 1711 Oral     SpO2 02/19/21 1711 98 %     Weight --      Height --      Head Circumference --      Peak Flow --      Pain Score 02/19/21 1710 7     Pain Loc --      Pain Edu? --      Excl. in GC? --    No data found.  Updated Vital Signs BP (!) 159/94 (BP Location: Left Arm)   Pulse (!) 112   Temp 98.7 F (37.1 C) (Oral)   Resp 20   SpO2 98%     Physical Exam Vitals and nursing note reviewed.  Constitutional:      General: He  is not in acute distress.    Appearance: Normal appearance. He is obese. He is not ill-appearing.  HENT:     Head: Normocephalic and atraumatic.     Nose: Nose normal.     Mouth/Throat:     Mouth: Mucous membranes are moist.     Pharynx: Oropharynx is clear.  Eyes:     Conjunctiva/sclera: Conjunctivae normal.  Cardiovascular:     Rate and Rhythm: Normal rate and regular rhythm.     Heart sounds: Normal heart sounds.  Pulmonary:     Effort: Pulmonary effort is normal.     Breath sounds: Normal breath sounds.  Skin:    General: Skin is warm and dry.     Findings: No rash.     Comments: Small area of induration with central wound to right lower abdomen (at belt line), surrounding erythema noted.  Neurological:     Mental Status: He is alert.  Psychiatric:        Mood and Affect: Mood normal.        Thought Content: Thought content normal.     UC Treatments / Results  Labs (all labs ordered are listed, but only abnormal results are displayed) Labs Reviewed - No data to display  EKG   Radiology No results found.  Procedures Procedures (including critical care time)  Medications Ordered in UC Medications - No data to display  Initial Impression / Assessment and Plan / UC Course  I have reviewed the triage vital signs and the nursing notes.  Pertinent labs & imaging results that were available during my care of the patient were reviewed by me and considered in my medical decision making (see chart for details).  Suspect abscess, and antibiotic prescribed.  Discussed that if he had any worsening symptoms he should report to the emergency room for further evaluation given location of abscess.  May ultimately need drained but hopefully given central wound will drain spontaneously with warm compresses.  Encouraged follow-up with any further concerns.  Okay to take Tylenol or ibuprofen for pain if needed.  Final Clinical Impressions(s) / UC Diagnoses   Final diagnoses:   Abscess     Discharge  Instructions      Take antibiotic as prescribed. Follow up if symptoms fail to improve or worsen in any way.      ED Prescriptions     Medication Sig Dispense Auth. Provider   doxycycline (VIBRAMYCIN) 100 MG capsule Take 1 capsule (100 mg total) by mouth 2 (two) times daily. 20 capsule Tomi Bamberger, PA-C      PDMP not reviewed this encounter.   Tomi Bamberger, PA-C 02/19/21 1733

## 2021-02-19 NOTE — Discharge Instructions (Addendum)
Take antibiotic as prescribed. Follow up if symptoms fail to improve or worsen in any way.  

## 2021-02-19 NOTE — ED Triage Notes (Signed)
Pt has knot on lower abd that is swollen and red with pain. Pt denies any drainage. Thinks could be insect bite reports wife was seen here recently for spider bit and got antibiotics.

## 2021-02-24 ENCOUNTER — Ambulatory Visit (HOSPITAL_COMMUNITY)
Admission: EM | Admit: 2021-02-24 | Discharge: 2021-02-24 | Disposition: A | Discharge status: Home / Self Care | Payer: BC Managed Care – PPO | Attending: Emergency Medicine | Admitting: Emergency Medicine

## 2021-02-24 ENCOUNTER — Other Ambulatory Visit: Payer: Self-pay

## 2021-02-24 ENCOUNTER — Encounter (HOSPITAL_COMMUNITY): Payer: Self-pay | Admitting: Emergency Medicine

## 2021-02-24 DIAGNOSIS — B349 Viral infection, unspecified: Secondary | ICD-10-CM

## 2021-02-24 DIAGNOSIS — M545 Low back pain, unspecified: Secondary | ICD-10-CM | POA: Diagnosis not present

## 2021-02-24 MED ORDER — PROMETHAZINE-DM 6.25-15 MG/5ML PO SYRP: 5.0000 mL | ORAL_SOLUTION | Freq: Four times a day (QID) | ORAL | 0 refills | Status: DC | PRN

## 2021-02-24 MED ORDER — GUAIFENESIN ER 600 MG PO TB12: 600.0000 mg | ORAL_TABLET | Freq: Two times a day (BID) | ORAL | 0 refills | Status: DC

## 2021-02-24 MED ORDER — PREDNISONE 20 MG PO TABS: 40.0000 mg | ORAL_TABLET | Freq: Every day | ORAL | 0 refills | Status: DC

## 2021-02-24 NOTE — ED Provider Notes (Signed)
[facility]    CSN: [id number] Arrival date & time: 02/24/21  9628      History   Chief Complaint Chief Complaint  Patient presents with  . Cough  . Back Pain    HPI Mike Watkins is a 55 y.o. male.   Patient presents with productive cough, nasal congestion, rhinorrhea and bilateral lower back pain for 4 days after being seen in urgent care for a different issue.  Denies fever, chills, body aches, headaches, ear pain or fullness, abdominal pain, nausea, vomiting, diarrhea, shortness of breath, wheezing, chest pain or tightness.  Vaccinated.  No known sick contacts.  Home COVID test negative.  Has been using NyQuil with some relief.  History of arthritis, hypertension.  Past Medical History:  Diagnosis Date  . Arthritis   . Hypertension   . Penile lesion     Patient Active Problem List   Diagnosis Date Noted  . Right upper extremity numbness 08/14/2018  . Chronic pain of left knee 02/07/2017  . Fall -multiple falls in the past year. 08/29/2016  . Acute pain of right knee 08/29/2016  . Contusion of right hand 07/14/2016  . Ganglion of flexor tendon sheath of right middle finger 06/02/2016  . Impingement syndrome of left shoulder 04/20/2016    Past Surgical History:  Procedure Laterality Date  . ANTERIOR CERVICAL DECOMP/DISCECTOMY FUSION  12-31-2002   C5 -- C7  . CYST EXCISION     pain in r/hand-middle finger  . HAND SURGERY    . PENILE BIOPSY N/A 08/23/2012   Procedure: EXCISIONAL BIOPSY OF PENILE LESION;  Surgeon: Milford Cage, MD;  Location: [facility];  Service: Urology;  Laterality: N/A;  EXCISIONAL BIOPSY OF PENILE LESION    . POSTERIOR FUSION CERVICAL SPINE  02-26-2005   C5 -- C7       Home Medications    Prior to Admission medications   Medication Sig Start Date End Date Taking? Authorizing Provider  guaiFENesin (MUCINEX) 600 MG 12 hr tablet Take 1 tablet (600 mg total) by mouth 2 (two) times daily. 02/24/21  Yes  Keashia Haskins R, NP  predniSONE (DELTASONE) 20 MG tablet Take 2 tablets (40 mg total) by mouth daily. 02/24/21  Yes Arnett Galindez R, NP  promethazine-dextromethorphan (PROMETHAZINE-DM) 6.25-15 MG/5ML syrup Take 5 mLs by mouth 4 (four) times daily as needed for cough. 02/24/21  Yes Annarae Macnair, Elita Boone, NP  acetaminophen (TYLENOL) 500 MG tablet Take 1,000 mg by mouth every 6 (six) hours as needed for moderate pain.    [provider]  albuterol (VENTOLIN HFA) 108 (90 Base) MCG/ACT inhaler Inhale 1-2 puffs into the lungs every 6 (six) hours as needed for wheezing or shortness of breath. 04/22/20   Wallis Bamberg, PA-C  doxycycline (VIBRAMYCIN) 100 MG capsule Take 1 capsule (100 mg total) by mouth 2 (two) times daily. 02/19/21   Tomi Bamberger, PA-C  morphine (MSIR) 15 MG tablet Take 1 tablet (15 mg total) by mouth every 4 (four) hours as needed for severe pain. 08/10/18   Melene Plan, DO  traMADol (ULTRAM) 50 MG tablet Take 1 tablet (50 mg total) by mouth at bedtime as needed. 11/14/18   Eldred Manges, MD    Family History Family History  Problem Relation Age of Onset  . Hypertension Mother   . Diabetes Mother   . Hypertension Father   . Cancer Father     Social History Social History   Tobacco Use  . Smoking  status: Former    Packs/day: 3.00    Years: 21.00    Pack years: 63.00    Types: Cigarettes    Quit date: 08/18/2002    Years since quitting: 18.5  . Smokeless tobacco: Former  Substance Use Topics  . Alcohol use: Yes    Comment: RARE  . Drug use: No     Allergies   Penicillins and Sulfa antibiotics   Review of Systems Review of Systems Defer to HPI   Physical Exam Triage Vital Signs ED Triage Vitals [02/24/21 0818]  Enc Vitals Group     BP (!) 161/100     Pulse Rate 91     Resp 20     Temp 97.9 F (36.6 C)     Temp Source Oral     SpO2 94 %     Weight      Height      Head Circumference      Peak Flow      Pain Score 9     Pain Loc      Pain Edu?       Excl. in GC?    No data found.  Updated Vital Signs BP (!) 161/100 (BP Location: Right Arm)   Pulse 91   Temp 97.9 F (36.6 C) (Oral)   Resp 20   SpO2 94%   Visual Acuity Right Eye Distance:   Left Eye Distance:   Bilateral Distance:    Right Eye Near:   Left Eye Near:    Bilateral Near:     Physical Exam Constitutional:      Appearance: Normal appearance.  HENT:     Head: Normocephalic.     Right Ear: Tympanic membrane, ear canal and external ear normal. There is impacted cerumen.     Left Ear: Tympanic membrane, ear canal and external ear normal. There is impacted cerumen.     Nose: Congestion and rhinorrhea present.     Mouth/Throat:     Mouth: Mucous membranes are moist.     Pharynx: Posterior oropharyngeal erythema present.  Eyes:     Extraocular Movements: Extraocular movements intact.  Cardiovascular:     Rate and Rhythm: Normal rate and regular rhythm.     Pulses: Normal pulses.     Heart sounds: Normal heart sounds.  Pulmonary:     Effort: Pulmonary effort is normal.     Breath sounds: Normal breath sounds.  Abdominal:     General: Abdomen is flat. Bowel sounds are normal.     Palpations: Abdomen is soft.  Musculoskeletal:     Cervical back: Normal range of motion and neck supple.  Skin:    General: Skin is warm and dry.  Neurological:     Mental Status: He is alert and oriented to person, place, and time. Mental status is at baseline.  Psychiatric:        Mood and Affect: Mood normal.        Behavior: Behavior normal.     UC Treatments / Results  Labs (all labs ordered are listed, but only abnormal results are displayed) Labs Reviewed - No data to display  EKG   Radiology No results found.  Procedures Procedures (including critical care time)  Medications Ordered in UC Medications - No data to display  Initial Impression / Assessment and Plan / UC Course  I have reviewed the triage vital signs and the nursing notes.  Pertinent  labs & imaging results that were available during my  care of the patient were reviewed by me and considered in my medical decision making (see chart for details).  Viral illness Acute bilateral low back pain with sciatica  1.  Prednisone 40 mg daily for 5 days 2.  Mucinex 600 mg twice daily as needed and 3.  Promethazine DM 6.25-15 mg/5 mils every 4 hours as needed 4.  Over-the-counter medications and home remedies for remaining symptom management 5.  Patient given return precautions that he may return to urgent care for persistent symptoms Final Clinical Impressions(s) / UC Diagnoses   Final diagnoses:  Viral illness  Acute bilateral low back pain without sciatica     Discharge Instructions      Today I believe your symptoms are being caused by a virus meaning they will resolve over time  Take prednisone each morning with food for the next 5 days  May use Mucinex twice a day as needed to help with congestion  May use 5 mils of Promethazine DM every 4 hours as needed for coughing, be mindful this medication may make you drowsy    You can take Tylenol and/or Ibuprofen as needed for fever reduction and pain relief.   For cough: honey 1/2 to 1 teaspoon (you can dilute the honey in water or another fluid).   You can use a humidifier for chest congestion and cough.  If you don't have a humidifier, you can sit in the bathroom with the hot shower running.      For sore throat: try warm salt water gargles, cepacol lozenges, throat spray, warm tea or water with lemon/honey, popsicles or ice, or OTC cold relief medicine for throat discomfort.   For congestion: take a daily anti-histamine like Zyrtec, Claritin, and a oral decongestant, such as pseudoephedrine.  You can also use Flonase 1-2 sprays in each nostril daily.   It is important to stay hydrated: drink plenty of fluids (water, gatorade/powerade/pedialyte, juices, or teas) to keep your throat moisturized and help further relieve  irritation/discomfort.     ED Prescriptions     Medication Sig Dispense Auth. Provider   predniSONE (DELTASONE) 20 MG tablet Take 2 tablets (40 mg total) by mouth daily. 10 tablet Marty Sadlowski, Hansel Starling R, NP   promethazine-dextromethorphan (PROMETHAZINE-DM) 6.25-15 MG/5ML syrup Take 5 mLs by mouth 4 (four) times daily as needed for cough. 118 mL Dyami Umbach R, NP   guaiFENesin (MUCINEX) 600 MG 12 hr tablet Take 1 tablet (600 mg total) by mouth 2 (two) times daily. 30 tablet Valinda Hoar, NP      PDMP not reviewed this encounter.   Valinda Hoar, [place] 02/24/21 [phone]

## 2021-02-24 NOTE — ED Triage Notes (Signed)
Pt reports after he was seen here on 9/8 started having lower back pain and cough that is productive with yellow-greenish phlegm. Reports congestion as well.

## 2021-02-24 NOTE — Discharge Instructions (Addendum)
Today I believe your symptoms are being caused by a virus meaning they will resolve over time  Take prednisone each morning with food for the next 5 days  May use Mucinex twice a day as needed to help with congestion  May use 5 mils of Promethazine DM every 4 hours as needed for coughing, be mindful this medication may make you drowsy    You can take Tylenol and/or Ibuprofen as needed for fever reduction and pain relief.   For cough: honey 1/2 to 1 teaspoon (you can dilute the honey in water or another fluid).   You can use a humidifier for chest congestion and cough.  If you don't have a humidifier, you can sit in the bathroom with the hot shower running.      For sore throat: try warm salt water gargles, cepacol lozenges, throat spray, warm tea or water with lemon/honey, popsicles or ice, or OTC cold relief medicine for throat discomfort.   For congestion: take a daily anti-histamine like Zyrtec, Claritin, and a oral decongestant, such as pseudoephedrine.  You can also use Flonase 1-2 sprays in each nostril daily.   It is important to stay hydrated: drink plenty of fluids (water, gatorade/powerade/pedialyte, juices, or teas) to keep your throat moisturized and help further relieve irritation/discomfort.

## 2021-03-23 ENCOUNTER — Other Ambulatory Visit: Payer: Self-pay

## 2021-03-23 ENCOUNTER — Ambulatory Visit (HOSPITAL_COMMUNITY)
Admission: EM | Admit: 2021-03-23 | Discharge: 2021-03-23 | Disposition: A | Discharge status: Home / Self Care | Payer: BC Managed Care – PPO | Attending: Internal Medicine | Admitting: Internal Medicine

## 2021-03-23 ENCOUNTER — Encounter (HOSPITAL_COMMUNITY): Payer: Self-pay | Admitting: Emergency Medicine

## 2021-03-23 DIAGNOSIS — N492 Inflammatory disorders of scrotum: Secondary | ICD-10-CM | POA: Diagnosis not present

## 2021-03-23 MED ORDER — MICONAZOLE NITRATE 2 % POWD: 0 refills | Status: DC

## 2021-03-23 MED ORDER — DOXYCYCLINE HYCLATE 100 MG PO CAPS: 100.0000 mg | ORAL_CAPSULE | Freq: Two times a day (BID) | ORAL | 0 refills | Status: AC

## 2021-03-23 NOTE — ED Provider Notes (Signed)
[facility]    CSN: [id number] Arrival date & time: 03/23/21  1636      History   Chief Complaint Chief Complaint  Patient presents with   Groin Pain    HPI Mike Watkins is a 55 y.o. male comes to the urgent care with painful scrotal swelling which started 4 to 5 days ago.  On Friday, patient squeezed the swelling resulting in purulent discharge.  The area continues to drain.  No fever or chills.  Pain is constant, mild to moderate and aggravated by palpation.  No known relieving factors.   HPI  Past Medical History:  Diagnosis Date   Arthritis    Hypertension    Penile lesion     Patient Active Problem List   Diagnosis Date Noted   Right upper extremity numbness 08/14/2018   Chronic pain of left knee 02/07/2017   Fall -multiple falls in the past year. 08/29/2016   Acute pain of right knee 08/29/2016   Contusion of right hand 07/14/2016   Ganglion of flexor tendon sheath of right middle finger 06/02/2016   Impingement syndrome of left shoulder 04/20/2016    Past Surgical History:  Procedure Laterality Date   ANTERIOR CERVICAL DECOMP/DISCECTOMY FUSION  12-31-2002   C5 -- C7   CYST EXCISION     pain in r/hand-middle finger   HAND SURGERY     PENILE BIOPSY N/A 08/23/2012   Procedure: EXCISIONAL BIOPSY OF PENILE LESION;  Surgeon: Milford Cage, MD;  Location: [facility];  Service: Urology;  Laterality: N/A;  EXCISIONAL BIOPSY OF PENILE LESION     POSTERIOR FUSION CERVICAL SPINE  02-26-2005   C5 -- C7       Home Medications    Prior to Admission medications   Medication Sig Start Date End Date Taking? Authorizing Provider  doxycycline (VIBRAMYCIN) 100 MG capsule Take 1 capsule (100 mg total) by mouth 2 (two) times daily for 7 days. 03/23/21 03/30/21 Yes Kaj Vasil, Britta Mccreedy, MD  Miconazole Nitrate 2 % POWD Apply to affected area twice daily for 7 days 03/23/21  Yes Faryal Marxen, Britta Mccreedy, MD  acetaminophen (TYLENOL) 500 MG tablet  Take 1,000 mg by mouth every 6 (six) hours as needed for moderate pain.    [provider]  albuterol (VENTOLIN HFA) 108 (90 Base) MCG/ACT inhaler Inhale 1-2 puffs into the lungs every 6 (six) hours as needed for wheezing or shortness of breath. 04/22/20   Wallis Bamberg, PA-C  guaiFENesin (MUCINEX) 600 MG 12 hr tablet Take 1 tablet (600 mg total) by mouth 2 (two) times daily. 02/24/21   White, Elita Boone, NP  morphine (MSIR) 15 MG tablet Take 1 tablet (15 mg total) by mouth every 4 (four) hours as needed for severe pain. 08/10/18   Melene Plan, DO  predniSONE (DELTASONE) 20 MG tablet Take 2 tablets (40 mg total) by mouth daily. 02/24/21   Valinda Hoar, NP  promethazine-dextromethorphan (PROMETHAZINE-DM) 6.25-15 MG/5ML syrup Take 5 mLs by mouth 4 (four) times daily as needed for cough. 02/24/21   White, Elita Boone, NP  traMADol (ULTRAM) 50 MG tablet Take 1 tablet (50 mg total) by mouth at bedtime as needed. 11/14/18   Eldred Manges, MD    Family History Family History  Problem Relation Age of Onset   Hypertension Mother    Diabetes Mother    Hypertension Father    Cancer Father     Social History Social History   Tobacco Use  Smoking status: Former    Packs/day: 3.00    Years: 21.00    Pack years: 63.00    Types: Cigarettes    Quit date: 08/18/2002    Years since quitting: 18.6   Smokeless tobacco: Former  Substance Use Topics   Alcohol use: Yes    Comment: RARE   Drug use: No     Allergies   Penicillins and Sulfa antibiotics   Review of Systems Review of Systems  Gastrointestinal: Negative.   Skin:  Positive for color change and wound. Negative for pallor and rash.    Physical Exam Triage Vital Signs ED Triage Vitals  Enc Vitals Group     BP 03/23/21 1820 (!) 155/87     Pulse Rate 03/23/21 1820 (!) 104     Resp 03/23/21 1820 19     Temp 03/23/21 1820 98.3 F (36.8 C)     Temp Source 03/23/21 1820 Oral     SpO2 03/23/21 1820 98 %     Weight --      Height  --      Head Circumference --      Peak Flow --      Pain Score 03/23/21 1819 2     Pain Loc --      Pain Edu? --      Excl. in GC? --    No data found.  Updated Vital Signs BP (!) 155/87 (BP Location: Right Arm)   Pulse (!) 104   Temp 98.3 F (36.8 C) (Oral)   Resp 19   SpO2 98%   Visual Acuity Right Eye Distance:   Left Eye Distance:   Bilateral Distance:    Right Eye Near:   Left Eye Near:    Bilateral Near:     Physical Exam Vitals and nursing note reviewed.  Constitutional:      Appearance: Normal appearance.  Cardiovascular:     Rate and Rhythm: Normal rate and regular rhythm.  Genitourinary:    Penis: Normal.      Comments: Right scrotal swelling.  There is an area of active drainage in the right scrotum.  Induration is about 1 inch in the longest diameter.  Both testes are normal with no masses or tenderness.  No surrounding erythema in the scrotum. Neurological:     Mental Status: He is alert.     UC Treatments / Results  Labs (all labs ordered are listed, but only abnormal results are displayed) Labs Reviewed - No data to display  EKG   Radiology No results found.  Procedures Procedures (including critical care time)  Medications Ordered in UC Medications - No data to display  Initial Impression / Assessment and Plan / UC Course  I have reviewed the triage vital signs and the nursing notes.  Pertinent labs & imaging results that were available during my care of the patient were reviewed by me and considered in my medical decision making (see chart for details).     1.  Draining scrotal wall abscess: No indication for incision and drainage Doxycycline twice daily for 7 days Warm compresses or sitz bath's advised Tylenol or Motrin as needed for pain and/or fever Return precautions given. Final Clinical Impressions(s) / UC Diagnoses   Final diagnoses:  Abscess of scrotal wall     Discharge Instructions      Use medications as  prescribed Warm compress or sitz bath's If you notice worsening pain, swelling, redness, fever please return to the urgent care to  be reevaluated    ED Prescriptions     Medication Sig Dispense Auth. Provider   Miconazole Nitrate 2 % POWD Apply to affected area twice daily for 7 days 100 g Chrisha Vogel, Britta Mccreedy, MD   doxycycline (VIBRAMYCIN) 100 MG capsule Take 1 capsule (100 mg total) by mouth 2 (two) times daily for 7 days. 14 capsule Hassen Bruun, Britta Mccreedy, MD      PDMP not reviewed this encounter.   Merrilee Jansky, MD 03/23/21 [phone]

## 2021-03-23 NOTE — Discharge Instructions (Addendum)
Use medications as prescribed Warm compress or sitz bath's If you notice worsening pain, swelling, redness, fever please return to the urgent care to be reevaluated

## 2021-03-23 NOTE — ED Triage Notes (Signed)
Pt reports Friday noticed a bump that had a white head so wife popped it. Reports pain where underwear rubs area in right groin. Worried bc brother had cancer.

## 2021-04-27 ENCOUNTER — Encounter: Payer: Self-pay | Admitting: Emergency Medicine

## 2021-04-27 ENCOUNTER — Ambulatory Visit
Admission: EM | Admit: 2021-04-27 | Discharge: 2021-04-27 | Disposition: A | Discharge status: Home / Self Care | Payer: BC Managed Care – PPO | Attending: Student | Admitting: Student

## 2021-04-27 DIAGNOSIS — R590 Localized enlarged lymph nodes: Secondary | ICD-10-CM

## 2021-04-27 MED ORDER — DOXYCYCLINE HYCLATE 100 MG PO CAPS: 100.0000 mg | ORAL_CAPSULE | Freq: Two times a day (BID) | ORAL | 0 refills | Status: AC

## 2021-04-27 MED ORDER — PREDNISONE 20 MG PO TABS: 40.0000 mg | ORAL_TABLET | Freq: Every day | ORAL | 0 refills | Status: AC

## 2021-04-27 NOTE — ED Provider Notes (Signed)
[facility]    CSN: [id number] Arrival date & time: 04/27/21  1544      History   Chief Complaint Chief Complaint  Patient presents with   Facial Swelling    HPI Mike Watkins is a 55 y.o. male presenting with cervical lymphadenopathy x4 days, initially L sided but now R sided and R posterior auricular lymphadenopathy.  Lymph nodes are tender, but denies any other pain, including ear pain, dental pain, throat pain, facial pain.  Denies fever/chills, weight loss.  He is a smoker. Tramadol helps with the pain, ibuprofen also helps somewhat.  HPI  Past Medical History:  Diagnosis Date   Arthritis    Hypertension    Penile lesion     Patient Active Problem List   Diagnosis Date Noted   Right upper extremity numbness 08/14/2018   Chronic pain of left knee 02/07/2017   Fall -multiple falls in the past year. 08/29/2016   Acute pain of right knee 08/29/2016   Contusion of right hand 07/14/2016   Ganglion of flexor tendon sheath of right middle finger 06/02/2016   Impingement syndrome of left shoulder 04/20/2016    Past Surgical History:  Procedure Laterality Date   ANTERIOR CERVICAL DECOMP/DISCECTOMY FUSION  12-31-2002   C5 -- C7   CYST EXCISION     pain in r/hand-middle finger   HAND SURGERY     PENILE BIOPSY N/A 08/23/2012   Procedure: EXCISIONAL BIOPSY OF PENILE LESION;  Surgeon: Milford Cage, MD;  Location: [facility];  Service: Urology;  Laterality: N/A;  EXCISIONAL BIOPSY OF PENILE LESION     POSTERIOR FUSION CERVICAL SPINE  02-26-2005   C5 -- C7       Home Medications    Prior to Admission medications   Medication Sig Start Date End Date Taking? Authorizing Provider  doxycycline (VIBRAMYCIN) 100 MG capsule Take 1 capsule (100 mg total) by mouth 2 (two) times daily for 7 days. 04/27/21 05/04/21 Yes Rhys Martini, PA-C  predniSONE (DELTASONE) 20 MG tablet Take 2 tablets (40 mg total) by mouth daily for 5 days. Take  with breakfast or lunch. Avoid NSAIDs (ibuprofen, etc) while taking this medication. 04/27/21 05/02/21 Yes Rhys Martini, PA-C  acetaminophen (TYLENOL) 500 MG tablet Take 1,000 mg by mouth every 6 (six) hours as needed for moderate pain.    [provider]  albuterol (VENTOLIN HFA) 108 (90 Base) MCG/ACT inhaler Inhale 1-2 puffs into the lungs every 6 (six) hours as needed for wheezing or shortness of breath. 04/22/20   Wallis Bamberg, PA-C  guaiFENesin (MUCINEX) 600 MG 12 hr tablet Take 1 tablet (600 mg total) by mouth 2 (two) times daily. 02/24/21   Valinda Hoar, NP  Miconazole Nitrate 2 % POWD Apply to affected area twice daily for 7 days 03/23/21   Lamptey, Britta Mccreedy, MD  morphine (MSIR) 15 MG tablet Take 1 tablet (15 mg total) by mouth every 4 (four) hours as needed for severe pain. 08/10/18   Melene Plan, DO  promethazine-dextromethorphan (PROMETHAZINE-DM) 6.25-15 MG/5ML syrup Take 5 mLs by mouth 4 (four) times daily as needed for cough. 02/24/21   White, Elita Boone, NP  traMADol (ULTRAM) 50 MG tablet Take 1 tablet (50 mg total) by mouth at bedtime as needed. 11/14/18   Eldred Manges, MD    Family History Family History  Problem Relation Age of Onset   Hypertension Mother    Diabetes Mother    Hypertension Father  Cancer Father     Social History Social History   Tobacco Use   Smoking status: Former    Packs/day: 3.00    Years: 21.00    Pack years: 63.00    Types: Cigarettes    Quit date: 08/18/2002    Years since quitting: 18.7   Smokeless tobacco: Former  Substance Use Topics   Alcohol use: Yes    Comment: RARE   Drug use: No     Allergies   Penicillins and Sulfa antibiotics   Review of Systems Review of Systems  All other systems reviewed and are negative.   Physical Exam Triage Vital Signs ED Triage Vitals  Enc Vitals Group     BP 04/27/21 1619 (!) 157/88     Pulse Rate 04/27/21 1619 98     Resp 04/27/21 1619 18     Temp 04/27/21 1619 (!) 97.5 F  (36.4 C)     Temp Source 04/27/21 1619 Oral     SpO2 04/27/21 1619 96 %     Weight --      Height --      Head Circumference --      Peak Flow --      Pain Score 04/27/21 1620 4     Pain Loc --      Pain Edu? --      Excl. in GC? --    No data found.  Updated Vital Signs BP (!) 157/88 (BP Location: Left Arm)   Pulse 98   Temp (!) 97.5 F (36.4 C) (Oral)   Resp 18   SpO2 96%   Visual Acuity Right Eye Distance:   Left Eye Distance:   Bilateral Distance:    Right Eye Near:   Left Eye Near:    Bilateral Near:     Physical Exam Vitals reviewed.  Constitutional:      General: He is not in acute distress.    Appearance: Normal appearance. He is not ill-appearing.  HENT:     Head: Normocephalic and atraumatic.     Right Ear: Tympanic membrane, ear canal and external ear normal. No tenderness. No middle ear effusion. There is no impacted cerumen. Tympanic membrane is not perforated, erythematous, retracted or bulging.     Left Ear: Tympanic membrane, ear canal and external ear normal. No tenderness.  No middle ear effusion. There is no impacted cerumen. Tympanic membrane is not perforated, erythematous, retracted or bulging.     Nose: Nose normal. No congestion.     Mouth/Throat:     Mouth: Mucous membranes are moist.     Pharynx: Uvula midline. No oropharyngeal exudate or posterior oropharyngeal erythema.     Comments: On exam, uvula is midline, she is tolerating her secretions without difficulty, there is no trismus, no drooling, she has normal phonation  Eyes:     Extraocular Movements: Extraocular movements intact.     Pupils: Pupils are equal, round, and reactive to light.  Neck:     Comments: L superficial cervical lymphadenopathy Rsuperficial cervical lymphadenopathy and preauricular lymphadenopathy Some TMJ pain but no trismus  Cardiovascular:     Rate and Rhythm: Normal rate and regular rhythm.     Heart sounds: Normal heart sounds.  Pulmonary:     Effort:  Pulmonary effort is normal.     Breath sounds: Normal breath sounds. No decreased breath sounds, wheezing, rhonchi or rales.  Abdominal:     Palpations: Abdomen is soft.     Tenderness: There is no  abdominal tenderness. There is no guarding or rebound.  Lymphadenopathy:     Cervical: Cervical adenopathy present.     Right cervical: Superficial cervical adenopathy present.     Left cervical: Superficial cervical adenopathy present.  Neurological:     General: No focal deficit present.     Mental Status: He is alert and oriented to person, place, and time.  Psychiatric:        Mood and Affect: Mood normal.        Behavior: Behavior normal.        Thought Content: Thought content normal.        Judgment: Judgment normal.     UC Treatments / Results  Labs (all labs ordered are listed, but only abnormal results are displayed) Labs Reviewed - No data to display  EKG   Radiology No results found.  Procedures Procedures (including critical care time)  Medications Ordered in UC Medications - No data to display  Initial Impression / Assessment and Plan / UC Course  I have reviewed the triage vital signs and the nursing notes.  Pertinent labs & imaging results that were available during my care of the patient were reviewed by me and considered in my medical decision making (see chart for details).     This patient is a very pleasant 55 y.o. year old male presenting with cervical lymphadenopathy. Afebrile, nontachy. No dental infection. Will manage with prednisone and doxycycline as he is penicillin allergic. He is a current smoker. F/u with [place] if symptoms worsen/persist..   Final Clinical Impressions(s) / UC Diagnoses   Final diagnoses:  Lymphadenopathy of right cervical region     Discharge Instructions      -Doxycycline twice daily for 7 days.  Make sure to wear sunscreen while spending time outside while on this medication as it can increase your chance of sunburn. You  can take this medication with food if you have a sensitive stomach. -Prednisone, 2 pills taken at the same time for 5 days in a row.  Try taking this earlier in the day as it can give you energy. Limit NSAIDs like ibuprofen and alleve while taking this medication as they can increase your risk of stomach upset and even GI bleeding when in combination with a steroid; make sure to take this medication with food. You can continue tylenol (acetaminophen) up to 1000mg  3x daily.    ED Prescriptions     Medication Sig Dispense Auth. Provider   doxycycline (VIBRAMYCIN) 100 MG capsule Take 1 capsule (100 mg total) by mouth 2 (two) times daily for 7 days. 14 capsule , PA-C   predniSONE (DELTASONE) 20 MG tablet Take 2 tablets (40 mg total) by mouth daily for 5 days. Take with breakfast or lunch. Avoid NSAIDs (ibuprofen, etc) while taking this medication. 10 tablet Rhys Martini, PA-C      PDMP not reviewed this encounter.   Rhys Martini, PA-C 04/27/21 [phone]

## 2021-04-27 NOTE — ED Triage Notes (Signed)
Neck swelling starting Friday. Started on left side of face and spread to right side slowly. States his jaw is sore, and thought it may be an abscessed tooth. Denies any known allergies. States this is not affecting his ability to breathe, swallow, and talk like normal.

## 2021-04-27 NOTE — Discharge Instructions (Addendum)
-  Doxycycline twice daily for 7 days.  Make sure to wear sunscreen while spending time outside while on this medication as it can increase your chance of sunburn. You can take this medication with food if you have a sensitive stomach. -Prednisone, 2 pills taken at the same time for 5 days in a row.  Try taking this earlier in the day as it can give you energy. Limit NSAIDs like ibuprofen and alleve while taking this medication as they can increase your risk of stomach upset and even GI bleeding when in combination with a steroid; make sure to take this medication with food. You can continue tylenol (acetaminophen) up to 1000mg  3x daily.

## 2021-05-09 ENCOUNTER — Ambulatory Visit (HOSPITAL_COMMUNITY)
Admission: EM | Admit: 2021-05-09 | Discharge: 2021-05-09 | Disposition: A | Discharge status: Home / Self Care | Payer: BC Managed Care – PPO | Attending: Emergency Medicine | Admitting: Emergency Medicine

## 2021-05-09 ENCOUNTER — Encounter (HOSPITAL_COMMUNITY): Payer: Self-pay

## 2021-05-09 ENCOUNTER — Other Ambulatory Visit: Payer: Self-pay

## 2021-05-09 DIAGNOSIS — M545 Low back pain, unspecified: Secondary | ICD-10-CM | POA: Diagnosis not present

## 2021-05-09 MED ORDER — FAMOTIDINE 40 MG PO TABS: 40.0000 mg | ORAL_TABLET | Freq: Every day | ORAL | 0 refills | Status: DC

## 2021-05-09 MED ORDER — IBUPROFEN 800 MG PO TABS
ORAL_TABLET | ORAL | Status: AC
  Filled 2021-05-09: qty 1

## 2021-05-09 MED ORDER — METHYLPREDNISOLONE 4 MG PO TABS: ORAL_TABLET | ORAL | 0 refills | Status: AC

## 2021-05-09 MED ORDER — IBUPROFEN 800 MG PO TABS: 800.0000 mg | ORAL_TABLET | Freq: Three times a day (TID) | ORAL | 0 refills | Status: DC

## 2021-05-09 MED ORDER — IBUPROFEN 800 MG PO TABS
800.0000 mg | ORAL_TABLET | Freq: Once | ORAL | Status: AC
  Administered 2021-05-09: 800 mg via ORAL

## 2021-05-09 MED ORDER — BACLOFEN 10 MG PO TABS
10.0000 mg | ORAL_TABLET | Freq: Three times a day (TID) | ORAL | 0 refills | Status: AC
Start: 2021-05-09 — End: 2021-05-16

## 2021-05-09 NOTE — ED Triage Notes (Signed)
Pt presents with back pain X 2 days after trying to take a ham out of the oven and twisting the wrong way.

## 2021-05-09 NOTE — Discharge Instructions (Addendum)
For your back pain, I recommend that you begin a taper dose of methylprednisolone, a steroid that should reduce the inflammation and thereby reduce the pain in your back.  Please take exactly as prescribed.  I also provided you with a prescription for ibuprofen that you can take as needed for any pain that the methylprednisolone does not relieve.  I have also provided you with a muscle relaxer that you can take up to 3 times daily.  Because all these medications can be really hard in your stomach, I provided you with today prescription for famotidine, and acid reducing medication that should keep your stomach comfortable while you are taking these medications.  Thank you for visiting urgent care today, I hope you feel better soon.  I also hope the ham was delicious.

## 2021-05-09 NOTE — ED Provider Notes (Signed)
[facility]    CSN: [id number] Arrival date & time: 05/09/21  1018    HISTORY   Chief Complaint  Patient presents with   Back Pain   HPI Mike Watkins is a 55 y.o. male. Patient reports back pain for 2 days after trying to take the family dinner ham out of the oven and inadvertently twisting the wrong way doing so.  Patient reports a history of surgery in his lumbar spine, states that he took a disc out and refused to bend together.  PDMP reviewed, patient is currently under the care of pain management, receives hydrocodone and tramadol monthly.  Patient states he is here for pain relief.  Patient politely declines injections of nonsteroidal anti-inflammatory pain patient today.  The history is provided by the patient.  Past Medical History:  Diagnosis Date   Arthritis    Hypertension    Penile lesion    Patient Active Problem List   Diagnosis Date Noted   Right upper extremity numbness 08/14/2018   Chronic pain of left knee 02/07/2017   Fall -multiple falls in the past year. 08/29/2016   Acute pain of right knee 08/29/2016   Contusion of right hand 07/14/2016   Ganglion of flexor tendon sheath of right middle finger 06/02/2016   Impingement syndrome of left shoulder 04/20/2016   Past Surgical History:  Procedure Laterality Date   ANTERIOR CERVICAL DECOMP/DISCECTOMY FUSION  12-31-2002   C5 -- C7   CYST EXCISION     pain in r/hand-middle finger   HAND SURGERY     PENILE BIOPSY N/A 08/23/2012   Procedure: EXCISIONAL BIOPSY OF PENILE LESION;  Surgeon: Milford Cage, MD;  Location: [facility];  Service: Urology;  Laterality: N/A;  EXCISIONAL BIOPSY OF PENILE LESION     POSTERIOR FUSION CERVICAL SPINE  02-26-2005   C5 -- C7    Home Medications    Prior to Admission medications   Medication Sig Start Date End Date Taking? Authorizing Provider  baclofen (LIORESAL) 10 MG tablet Take 1 tablet (10 mg total) by mouth 3 (three) times  daily for 7 days. 05/09/21 05/16/21 Yes Theadora Rama Scales, PA-C  famotidine (PEPCID) 40 MG tablet Take 1 tablet (40 mg total) by mouth daily for 10 days. 05/09/21 05/19/21 Yes Theadora Rama Scales, PA-C  ibuprofen (ADVIL) 800 MG tablet Take 1 tablet (800 mg total) by mouth 3 (three) times daily. 05/09/21  Yes Theadora Rama Scales, PA-C  methylPREDNISolone (MEDROL) 4 MG tablet Take 9 tablets (36 mg total) by mouth daily for 1 day, THEN 8 tablets (32 mg total) daily for 1 day, THEN 7 tablets (28 mg total) daily for 1 day, THEN 6 tablets (24 mg total) daily for 1 day, THEN 5 tablets (20 mg total) daily for 1 day, THEN 4 tablets (16 mg total) daily for 1 day, THEN 3 tablets (12 mg total) daily for 1 day, THEN 2 tablets (8 mg total) daily for 1 day, THEN 1 tablet (4 mg total) daily for 1 day. 05/09/21 05/18/21 Yes Theadora Rama Scales, PA-C  acetaminophen (TYLENOL) 500 MG tablet Take 1,000 mg by mouth every 6 (six) hours as needed for moderate pain.    [provider]  albuterol (VENTOLIN HFA) 108 (90 Base) MCG/ACT inhaler Inhale 1-2 puffs into the lungs every 6 (six) hours as needed for wheezing or shortness of breath. 04/22/20   Wallis Bamberg, PA-C  guaiFENesin (MUCINEX) 600 MG 12 hr tablet Take 1 tablet (600 mg  total) by mouth 2 (two) times daily. 02/24/21   Valinda Hoar, NP  Miconazole Nitrate 2 % POWD Apply to affected area twice daily for 7 days 03/23/21   Lamptey, Britta Mccreedy, MD  morphine (MSIR) 15 MG tablet Take 1 tablet (15 mg total) by mouth every 4 (four) hours as needed for severe pain. 08/10/18   Melene Plan, DO  promethazine-dextromethorphan (PROMETHAZINE-DM) 6.25-15 MG/5ML syrup Take 5 mLs by mouth 4 (four) times daily as needed for cough. 02/24/21   White, Elita Boone, NP  traMADol (ULTRAM) 50 MG tablet Take 1 tablet (50 mg total) by mouth at bedtime as needed. 11/14/18   Eldred Manges, MD   Family History Family History  Problem Relation Age of Onset   Hypertension Mother     Diabetes Mother    Hypertension Father    Cancer Father    Social History Social History   Tobacco Use   Smoking status: Former    Packs/day: 3.00    Years: 21.00    Pack years: 63.00    Types: Cigarettes    Quit date: 08/18/2002    Years since quitting: 18.7   Smokeless tobacco: Former  Substance Use Topics   Alcohol use: Yes    Comment: RARE   Drug use: No   Allergies   Penicillins and Sulfa antibiotics  Review of Systems Review of Systems Pertinent findings noted in history of present illness.   Physical Exam Triage Vital Signs ED Triage Vitals  Enc Vitals Group     BP 04/10/21 0827 (!) 147/82     Pulse Rate 04/10/21 0827 72     Resp 04/10/21 0827 18     Temp 04/10/21 0827 98.3 F (36.8 C)     Temp Source 04/10/21 0827 Oral     SpO2 04/10/21 0827 98 %     Weight --      Height --      Head Circumference --      Peak Flow --      Pain Score 04/10/21 0826 5     Pain Loc --      Pain Edu? --      Excl. in GC? --   No data found.  Updated Vital Signs BP (!) 140/92 (BP Location: Right Arm)   Pulse 95   Temp 97.8 F (36.6 C) (Oral)   Resp 20   SpO2 96%   Physical Exam Vitals and nursing note reviewed.  Constitutional:      General: He is not in acute distress.    Appearance: Normal appearance. He is not ill-appearing.  HENT:     Head: Normocephalic and atraumatic.  Eyes:     General: Lids are normal.        Right eye: No discharge.        Left eye: No discharge.     Extraocular Movements: Extraocular movements intact.     Conjunctiva/sclera: Conjunctivae normal.     Right eye: Right conjunctiva is not injected.     Left eye: Left conjunctiva is not injected.  Neck:     Trachea: Trachea and phonation normal.  Cardiovascular:     Rate and Rhythm: Normal rate and regular rhythm.     Pulses: Normal pulses.     Heart sounds: Normal heart sounds. No murmur heard.   No friction rub. No gallop.  Pulmonary:     Effort: Pulmonary effort is normal. No  accessory muscle usage, prolonged expiration or respiratory distress.  Breath sounds: Normal breath sounds. No stridor, decreased air movement or transmitted upper airway sounds. No decreased breath sounds, wheezing, rhonchi or rales.  Chest:     Chest wall: No tenderness.  Musculoskeletal:        General: Tenderness (Right lumbar paraspinous muscles with some spasm) present. Normal range of motion.     Cervical back: Normal range of motion and neck supple. Normal range of motion.  Lymphadenopathy:     Cervical: No cervical adenopathy.  Skin:    General: Skin is warm and dry.     Findings: No erythema or rash.  Neurological:     General: No focal deficit present.     Mental Status: He is alert and oriented to person, place, and time.  Psychiatric:        Mood and Affect: Mood normal.        Behavior: Behavior normal.    Visual Acuity Right Eye Distance:   Left Eye Distance:   Bilateral Distance:    Right Eye Near:   Left Eye Near:    Bilateral Near:     UC Couse / Diagnostics / Procedures:    EKG  Radiology No results found.  Procedures Procedures (including critical care time)  UC Diagnoses / Final Clinical Impressions(s)    Final diagnoses:  Acute right-sided low back pain without sciatica   I have reviewed the triage vital signs and the nursing notes.  Pertinent labs & imaging results that were available during my care of the patient were reviewed by me and considered in my medical decision making (see chart for details).    Patient provided with methylprednisolone and baclofen, return precautions advised.  Patient politely declined ketorolac injection.  Patient/parent/caregiver verbalized understanding and agreement of plan as discussed.  All questions were addressed during visit.  Please see discharge instructions below for further details of plan.  ED Prescriptions     Medication Sig Dispense Auth. Provider   methylPREDNISolone (MEDROL) 4 MG tablet Take  9 tablets (36 mg total) by mouth daily for 1 day, THEN 8 tablets (32 mg total) daily for 1 day, THEN 7 tablets (28 mg total) daily for 1 day, THEN 6 tablets (24 mg total) daily for 1 day, THEN 5 tablets (20 mg total) daily for 1 day, THEN 4 tablets (16 mg total) daily for 1 day, THEN 3 tablets (12 mg total) daily for 1 day, THEN 2 tablets (8 mg total) daily for 1 day, THEN 1 tablet (4 mg total) daily for 1 day. 45 tablet Theadora Rama Scales, PA-C   baclofen (LIORESAL) 10 MG tablet Take 1 tablet (10 mg total) by mouth 3 (three) times daily for 7 days. 21 tablet Theadora Rama Scales, PA-C   ibuprofen (ADVIL) 800 MG tablet Take 1 tablet (800 mg total) by mouth 3 (three) times daily. 21 tablet Theadora Rama Scales, PA-C   famotidine (PEPCID) 40 MG tablet Take 1 tablet (40 mg total) by mouth daily for 10 days. 10 tablet Theadora Rama Scales, PA-C      PDMP not reviewed this encounter.  Pending results:  Labs Reviewed - No data to display   Medications Ordered in UC: Medications  ibuprofen (ADVIL) tablet 800 mg (800 mg Oral Given 05/09/21 1132)    Discharge Instructions:   Discharge Instructions      For your back pain, I recommend that you begin a taper dose of methylprednisolone, a steroid that should reduce the inflammation and thereby reduce the pain in your  back.  Please take exactly as prescribed.  I also provided you with a prescription for ibuprofen that you can take as needed for any pain that the methylprednisolone does not relieve.  I have also provided you with a muscle relaxer that you can take up to 3 times daily.  Because all these medications can be really hard in your stomach, I provided you with today prescription for famotidine, and acid reducing medication that should keep your stomach comfortable while you are taking these medications.  Thank you for visiting urgent care today, I hope you feel better soon.  I also hope the ham was delicious.      Disposition  Upon Discharge:  Patient presented with an acute illness with associated systemic symptoms and significant discomfort requiring urgent management. In my opinion, this is a condition that a prudent lay person (someone who possesses an average knowledge of health and medicine) may potentially expect to result in complications if not addressed urgently such as respiratory distress, impairment of bodily function or dysfunction of bodily organs.   Routine symptom specific, illness specific and/or disease specific instructions were discussed with the patient and/or caregiver at length.   As such, the patient has been evaluated and assessed, work-up was performed and treatment was provided in alignment with urgent care protocols and evidence based medicine.  Patient/parent/caregiver has been advised that the patient may require follow up for further testing and treatment if the symptoms continue in spite of treatment, as clinically indicated and appropriate.  If the patient was tested for COVID-19, Influenza and/or RSV, then the patient/parent/guardian was advised to isolate at home pending the results of his/her diagnostic coronavirus test and potentially longer if they're positive. I have also advised pt that if his/her COVID-19 test returns positive, it's recommended to self-isolate for at least 10 days after symptoms first appeared AND until fever-free for 24 hours without fever reducer AND other symptoms have improved or resolved. Discussed self-isolation recommendations as well as instructions for household member/close contacts as per the [facility] and [place] DHHS, and also gave patient the COVID packet with this information.  Patient/parent/caregiver has been advised to return to the [facility] or PCP in 3-5 days if no better; to PCP or the Emergency Department if new signs and symptoms develop, or if the current signs or symptoms continue to change or worsen for further workup, evaluation and treatment as clinically  indicated and appropriate  The patient will follow up with their current PCP if and as advised. If the patient does not currently have a PCP we will assist them in obtaining one.   The patient may need specialty follow up if the symptoms continue, in spite of conservative treatment and management, for further workup, evaluation, consultation and treatment as clinically indicated and appropriate.  Condition: stable for discharge home Home: take medications as prescribed; routine discharge instructions as discussed; follow up as advised.    Theadora Rama Scales, PA-C 05/09/21 1910

## 2021-06-28 ENCOUNTER — Ambulatory Visit (HOSPITAL_COMMUNITY)
Admission: EM | Admit: 2021-06-28 | Discharge: 2021-06-28 | Disposition: A | Discharge status: Home / Self Care | Payer: BC Managed Care – PPO | Attending: Student | Admitting: Student

## 2021-06-28 ENCOUNTER — Encounter (HOSPITAL_COMMUNITY): Payer: Self-pay | Admitting: Emergency Medicine

## 2021-06-28 ENCOUNTER — Other Ambulatory Visit: Payer: Self-pay

## 2021-06-28 DIAGNOSIS — E6609 Other obesity due to excess calories: Secondary | ICD-10-CM

## 2021-06-28 DIAGNOSIS — J069 Acute upper respiratory infection, unspecified: Secondary | ICD-10-CM | POA: Diagnosis not present

## 2021-06-28 MED ORDER — PREDNISONE 20 MG PO TABS: 40.0000 mg | ORAL_TABLET | Freq: Every day | ORAL | 0 refills | Status: AC

## 2021-06-28 NOTE — ED Triage Notes (Signed)
PT reports congestion, shortness of breath, and sore throat for 1 week. Possible fever, he has not checked temp  Has not COVID tested at home.

## 2021-06-28 NOTE — Discharge Instructions (Signed)
-  Prednisone, 2 pills taken at the same time for 5 days in a row.  Try taking this earlier in the day as it can give you energy. Avoid NSAIDs like ibuprofen and alleve while taking this medication as they can increase your risk of stomach upset and even GI bleeding when in combination with a steroid. You can continue tylenol (acetaminophen) up to 1000mg  3x daily. -Albuterol inhaler as needed for cough, wheezing, shortness of breath, 1 to 2 puffs every 6 hours as needed. -Continue over-the-counter medications like Mucinex and Nyquil -Follow-up with PCP to discuss weight loss today -You don't have a sinus infection or lung infection today. If your symptoms get worse like shortness of breath, new fevers, coughing up brown, severe facial pain - seek additional medical attention.

## 2021-06-28 NOTE — ED Provider Notes (Signed)
[place]    CSN: [id number] Arrival date & time: 06/28/21  1003      History   Chief Complaint Chief Complaint  Patient presents with   Sore Throat   Nasal Congestion    HPI Mike Watkins is a 56 y.o. male presenting with viral syndrome for about 5 days.  Medical history hypertension.  Describes nasal congestion, cough, sore throat.  Subjective chills, has not monitored temperature at home. Cough is productive of clear sputum. Denies SOB, CP, dizziness, weakness. Denies sick contacts, has not checked a COVID test at home. Denies history pulm ds. Has attempted mucinex at home. Albuterol inhaler provided at last visit providing some relief. Nyquil at night. Tolerating fluids and foods, denies n/v/d/c.   HPI  Past Medical History:  Diagnosis Date   Arthritis    Hypertension    Penile lesion     Patient Active Problem List   Diagnosis Date Noted   Right upper extremity numbness 08/14/2018   Chronic pain of left knee 02/07/2017   Fall -multiple falls in the past year. 08/29/2016   Acute pain of right knee 08/29/2016   Contusion of right hand 07/14/2016   Ganglion of flexor tendon sheath of right middle finger 06/02/2016   Impingement syndrome of left shoulder 04/20/2016    Past Surgical History:  Procedure Laterality Date   ANTERIOR CERVICAL DECOMP/DISCECTOMY FUSION  12-31-2002   C5 -- C7   CYST EXCISION     pain in r/hand-middle finger   HAND SURGERY     PENILE BIOPSY N/A 08/23/2012   Procedure: EXCISIONAL BIOPSY OF PENILE LESION;  Surgeon: Molli Hazard, MD;  Location: [facility];  Service: Urology;  Laterality: N/A;  EXCISIONAL BIOPSY OF PENILE LESION     POSTERIOR FUSION CERVICAL SPINE  02-26-2005   C5 -- C7       Home Medications    Prior to Admission medications   Medication Sig Start Date End Date Taking? Authorizing Provider  predniSONE (DELTASONE) 20 MG tablet Take 2 tablets (40 mg total) by mouth daily for 5  days. Take with breakfast or lunch. Avoid NSAIDs (ibuprofen, etc) while taking this medication. 06/28/21 07/03/21 Yes Hazel Sams, PA-C  acetaminophen (TYLENOL) 500 MG tablet Take 1,000 mg by mouth every 6 (six) hours as needed for moderate pain.    [provider]  albuterol (VENTOLIN HFA) 108 (90 Base) MCG/ACT inhaler Inhale 1-2 puffs into the lungs every 6 (six) hours as needed for wheezing or shortness of breath. 04/22/20   Jaynee Eagles, PA-C  famotidine (PEPCID) 40 MG tablet Take 1 tablet (40 mg total) by mouth daily for 10 days. 05/09/21 05/19/21  Lynden Oxford Scales, PA-C  guaiFENesin (MUCINEX) 600 MG 12 hr tablet Take 1 tablet (600 mg total) by mouth 2 (two) times daily. 02/24/21   White, Leitha Schuller, NP  ibuprofen (ADVIL) 800 MG tablet Take 1 tablet (800 mg total) by mouth 3 (three) times daily. 05/09/21   Lynden Oxford Scales, PA-C  Miconazole Nitrate 2 % POWD Apply to affected area twice daily for 7 days 03/23/21   Chase Picket, MD  morphine (MSIR) 15 MG tablet Take 1 tablet (15 mg total) by mouth every 4 (four) hours as needed for severe pain. 08/10/18   Deno Etienne, DO  promethazine-dextromethorphan (PROMETHAZINE-DM) 6.25-15 MG/5ML syrup Take 5 mLs by mouth 4 (four) times daily as needed for cough. 02/24/21   Hans Eden, NP  traMADol (ULTRAM) 50 MG  tablet Take 1 tablet (50 mg total) by mouth at bedtime as needed. 11/14/18   Marybelle Killings, MD    Family History Family History  Problem Relation Age of Onset   Hypertension Mother    Diabetes Mother    Hypertension Father    Cancer Father     Social History Social History   Tobacco Use   Smoking status: Former    Packs/day: 3.00    Years: 21.00    Pack years: 63.00    Types: Cigarettes    Quit date: 08/18/2002    Years since quitting: 18.8   Smokeless tobacco: Former  Substance Use Topics   Alcohol use: Yes    Comment: RARE   Drug use: No     Allergies   Penicillins and Sulfa antibiotics   Review of  Systems Review of Systems  Constitutional:  Negative for appetite change, chills and fever.  HENT:  Positive for congestion. Negative for ear pain, rhinorrhea, sinus pressure, sinus pain and sore throat.   Eyes:  Negative for redness and visual disturbance.  Respiratory:  Positive for cough. Negative for chest tightness, shortness of breath and wheezing.   Cardiovascular:  Negative for chest pain and palpitations.  Gastrointestinal:  Negative for abdominal pain, constipation, diarrhea, nausea and vomiting.  Genitourinary:  Negative for dysuria, frequency and urgency.  Musculoskeletal:  Negative for myalgias.  Neurological:  Negative for dizziness, weakness and headaches.  Psychiatric/Behavioral:  Negative for confusion.   All other systems reviewed and are negative.   Physical Exam Triage Vital Signs ED Triage Vitals  Enc Vitals Group     BP 06/28/21 1013 (!) 146/96     Pulse Rate 06/28/21 1013 94     Resp 06/28/21 1013 16     Temp 06/28/21 1013 98.4 F (36.9 C)     Temp Source 06/28/21 1013 Oral     SpO2 06/28/21 1013 93 %     Weight --      Height --      Head Circumference --      Peak Flow --      Pain Score 06/28/21 1011 3     Pain Loc --      Pain Edu? --      Excl. in [place]? --    No data found.  Updated Vital Signs BP (!) 146/96    Pulse 94    Temp 98.4 F (36.9 C) (Oral)    Resp 16    SpO2 93%   Visual Acuity Right Eye Distance:   Left Eye Distance:   Bilateral Distance:    Right Eye Near:   Left Eye Near:    Bilateral Near:     Physical Exam Vitals reviewed.  Constitutional:      General: He is not in acute distress.    Appearance: Normal appearance. He is obese. He is not ill-appearing.  HENT:     Head: Normocephalic and atraumatic.     Right Ear: Tympanic membrane, ear canal and external ear normal. No tenderness. No middle ear effusion. There is no impacted cerumen. Tympanic membrane is not perforated, erythematous, retracted or bulging.     Left  Ear: Tympanic membrane, ear canal and external ear normal. No tenderness.  No middle ear effusion. There is no impacted cerumen. Tympanic membrane is not perforated, erythematous, retracted or bulging.     Nose: Nose normal. No congestion.     Mouth/Throat:     Mouth: Mucous membranes are moist.  Pharynx: Uvula midline. No oropharyngeal exudate or posterior oropharyngeal erythema.  Eyes:     Extraocular Movements: Extraocular movements intact.     Pupils: Pupils are equal, round, and reactive to light.  Cardiovascular:     Rate and Rhythm: Normal rate and regular rhythm.     Heart sounds: Normal heart sounds.  Pulmonary:     Effort: Pulmonary effort is normal.     Breath sounds: Normal breath sounds. No decreased breath sounds, wheezing, rhonchi or rales.  Abdominal:     Palpations: Abdomen is soft.     Tenderness: There is no abdominal tenderness. There is no guarding or rebound.  Lymphadenopathy:     Cervical: No cervical adenopathy.     Right cervical: No superficial cervical adenopathy.    Left cervical: No superficial cervical adenopathy.  Neurological:     General: No focal deficit present.     Mental Status: He is alert and oriented to person, place, and time.  Psychiatric:        Mood and Affect: Mood normal.        Behavior: Behavior normal.        Thought Content: Thought content normal.        Judgment: Judgment normal.     UC Treatments / Results  Labs (all labs ordered are listed, but only abnormal results are displayed) Labs Reviewed - No data to display  EKG   Radiology No results found.  Procedures Procedures (including critical care time)  Medications Ordered in UC Medications - No data to display  Initial Impression / Assessment and Plan / UC Course  I have reviewed the triage vital signs and the nursing notes.  Pertinent labs & imaging results that were available during my care of the patient were reviewed by me and considered in my medical  decision making (see chart for details).     This patient is a very pleasant 56 y.o. year old male presenting with viral syndrome. Today this pt is afebrile nontachycardic nontachypneic, oxygenating well on room air, no wheezes rhonchi or rales. No history pulm ds.   Declines COVID testing, I am in agreement given duration of symptoms.  Continue over-the-counter medications, and low-dose prednisone sent. He is not a diabetic. Discussed that he does not have a bacterial infection and abx are not indicated. Discussed s/s of secondary sickening phenomenon. He is in agreement.  For obesity, praised healthy lifestyle changes, follow-up with PCP.  ED return precautions discussed. Patient verbalizes understanding and agreement.   .   Final Clinical Impressions(s) / UC Diagnoses   Final diagnoses:  Viral URI with cough     Discharge Instructions      -Prednisone, 2 pills taken at the same time for 5 days in a row.  Try taking this earlier in the day as it can give you energy. Avoid NSAIDs like ibuprofen and alleve while taking this medication as they can increase your risk of stomach upset and even GI bleeding when in combination with a steroid. You can continue tylenol (acetaminophen) up to 1000mg  3x daily. -Albuterol inhaler as needed for cough, wheezing, shortness of breath, 1 to 2 puffs every 6 hours as needed. -Continue over-the-counter medications like Mucinex and Nyquil -Follow-up with PCP to discuss weight loss today -You don't have a sinus infection or lung infection today. If your symptoms get worse like shortness of breath, new fevers, coughing up brown, severe facial pain - seek additional medical attention.     ED Prescriptions  Medication Sig Dispense Auth. Provider   predniSONE (DELTASONE) 20 MG tablet Take 2 tablets (40 mg total) by mouth daily for 5 days. Take with breakfast or lunch. Avoid NSAIDs (ibuprofen, etc) while taking this medication. 10 tablet Hazel Sams, PA-C      PDMP not reviewed this encounter.   Hazel Sams, PA-C 06/28/21 [phone]

## 2021-07-22 ENCOUNTER — Ambulatory Visit (HOSPITAL_COMMUNITY)
Admission: EM | Admit: 2021-07-22 | Discharge: 2021-07-22 | Disposition: A | Discharge status: Home / Self Care | Payer: BC Managed Care – PPO | Attending: Physician Assistant | Admitting: Physician Assistant

## 2021-07-22 ENCOUNTER — Encounter (HOSPITAL_COMMUNITY): Payer: Self-pay | Admitting: Physician Assistant

## 2021-07-22 ENCOUNTER — Other Ambulatory Visit: Payer: Self-pay

## 2021-07-22 DIAGNOSIS — L02415 Cutaneous abscess of right lower limb: Secondary | ICD-10-CM

## 2021-07-22 DIAGNOSIS — L0291 Cutaneous abscess, unspecified: Secondary | ICD-10-CM

## 2021-07-22 MED ORDER — DOXYCYCLINE HYCLATE 100 MG PO CAPS: 100.0000 mg | ORAL_CAPSULE | Freq: Two times a day (BID) | ORAL | 0 refills | Status: DC

## 2021-07-22 NOTE — ED Triage Notes (Signed)
Pt reports an abscess in Rt groin

## 2021-07-22 NOTE — Discharge Instructions (Signed)
°  Use warm compresses to help promote drainage. Follow up with any further concerns.

## 2021-07-22 NOTE — ED Provider Notes (Signed)
[facility]    CSN: [id number] Arrival date & time: 07/22/21  1612      History   Chief Complaint Chief Complaint  Patient presents with   Abscess    HPI Mike Watkins is a 56 y.o. male.   Patient here today for evaluation of possible abscess to his right upper leg. He reports that he first noticed pain in the area a few days ago. He states his wife reports she thinks there has been some drainage. He has not had fever. He denies any treatment.   The history is provided by the patient.  Abscess Associated symptoms: no fever    Past Medical History:  Diagnosis Date   Arthritis    Hypertension    Penile lesion     Patient Active Problem List   Diagnosis Date Noted   Right upper extremity numbness 08/14/2018   Chronic pain of left knee 02/07/2017   Fall -multiple falls in the past year. 08/29/2016   Acute pain of right knee 08/29/2016   Contusion of right hand 07/14/2016   Ganglion of flexor tendon sheath of right middle finger 06/02/2016   Impingement syndrome of left shoulder 04/20/2016    Past Surgical History:  Procedure Laterality Date   ANTERIOR CERVICAL DECOMP/DISCECTOMY FUSION  12-31-2002   C5 -- C7   CYST EXCISION     pain in r/hand-middle finger   HAND SURGERY     PENILE BIOPSY N/A 08/23/2012   Procedure: EXCISIONAL BIOPSY OF PENILE LESION;  Surgeon: Milford Cage, MD;  Location: [facility];  Service: Urology;  Laterality: N/A;  EXCISIONAL BIOPSY OF PENILE LESION     POSTERIOR FUSION CERVICAL SPINE  02-26-2005   C5 -- C7       Home Medications    Prior to Admission medications   Medication Sig Start Date End Date Taking? Authorizing Provider  doxycycline (VIBRAMYCIN) 100 MG capsule Take 1 capsule (100 mg total) by mouth 2 (two) times daily. 07/22/21  Yes Tomi Bamberger, PA-C  acetaminophen (TYLENOL) 500 MG tablet Take 1,000 mg by mouth every 6 (six) hours as needed for moderate pain.    [provider]   albuterol (VENTOLIN HFA) 108 (90 Base) MCG/ACT inhaler Inhale 1-2 puffs into the lungs every 6 (six) hours as needed for wheezing or shortness of breath. 04/22/20   Wallis Bamberg, PA-C  famotidine (PEPCID) 40 MG tablet Take 1 tablet (40 mg total) by mouth daily for 10 days. 05/09/21 05/19/21  Theadora Rama Scales, PA-C  guaiFENesin (MUCINEX) 600 MG 12 hr tablet Take 1 tablet (600 mg total) by mouth 2 (two) times daily. 02/24/21   White, Elita Boone, NP  ibuprofen (ADVIL) 800 MG tablet Take 1 tablet (800 mg total) by mouth 3 (three) times daily. 05/09/21   Theadora Rama Scales, PA-C  Miconazole Nitrate 2 % POWD Apply to affected area twice daily for 7 days 03/23/21   Merrilee Jansky, MD  morphine (MSIR) 15 MG tablet Take 1 tablet (15 mg total) by mouth every 4 (four) hours as needed for severe pain. 08/10/18   Melene Plan, DO  promethazine-dextromethorphan (PROMETHAZINE-DM) 6.25-15 MG/5ML syrup Take 5 mLs by mouth 4 (four) times daily as needed for cough. 02/24/21   White, Elita Boone, NP  traMADol (ULTRAM) 50 MG tablet Take 1 tablet (50 mg total) by mouth at bedtime as needed. 11/14/18   Eldred Manges, MD    Family History Family History  Problem Relation Age  of Onset   Hypertension Mother    Diabetes Mother    Hypertension Father    Cancer Father     Social History Social History   Tobacco Use   Smoking status: Former    Packs/day: 3.00    Years: 21.00    Pack years: 63.00    Types: Cigarettes    Quit date: 08/18/2002    Years since quitting: 18.9   Smokeless tobacco: Former  Substance Use Topics   Alcohol use: Yes    Comment: RARE   Drug use: No     Allergies   Penicillins and Sulfa antibiotics   Review of Systems Review of Systems  Constitutional:  Negative for chills and fever.  Eyes:  Negative for discharge and redness.  Skin:  Positive for color change and wound.    Physical Exam Triage Vital Signs ED Triage Vitals  Enc Vitals Group     BP 07/22/21 1658 (!) 151/82      Pulse Rate 07/22/21 1658 96     Resp 07/22/21 1658 18     Temp 07/22/21 1658 98.2 F (36.8 C)     Temp src --      SpO2 07/22/21 1658 98 %     Weight --      Height --      Head Circumference --      Peak Flow --      Pain Score 07/22/21 1656 8     Pain Loc --      Pain Edu? --      Excl. in [place]? --    No data found.  Updated Vital Signs BP (!) 151/82    Pulse 96    Temp 98.2 F (36.8 C)    Resp 18    SpO2 98%   Physical Exam Vitals and nursing note reviewed.  Constitutional:      General: He is not in acute distress.    Appearance: Normal appearance. He is not ill-appearing.  HENT:     Head: Normocephalic and atraumatic.  Eyes:     Conjunctiva/sclera: Conjunctivae normal.  Cardiovascular:     Rate and Rhythm: Normal rate.  Pulmonary:     Effort: Pulmonary effort is normal.  Skin:    Comments: ~ 1 cm wound to right upper medial thigh without active drainage, surrounding erythema and induration noted, no apparent fluctuance.   Neurological:     Mental Status: He is alert.  Psychiatric:        Mood and Affect: Mood normal.        Behavior: Behavior normal.        Thought Content: Thought content normal.     UC Treatments / Results  Labs (all labs ordered are listed, but only abnormal results are displayed) Labs Reviewed - No data to display  EKG   Radiology No results found.  Procedures Procedures (including critical care time)  Medications Ordered in UC Medications - No data to display  Initial Impression / Assessment and Plan / UC Course  I have reviewed the triage vital signs and the nursing notes.  Pertinent labs & imaging results that were available during my care of the patient were reviewed by me and considered in my medical decision making (see chart for details).    I & D not indicated due to present central wound, spontaneous drainage. Recommended warm compresses to promote drainage and antibiotic prescribed. Encouraged follow up with  any further concerns or if symptoms fail to  improve with treatment.   Final Clinical Impressions(s) / UC Diagnoses   Final diagnoses:  Abscess     Discharge Instructions       Use warm compresses to help promote drainage. Follow up with any further concerns.        ED Prescriptions     Medication Sig Dispense Auth. Provider   doxycycline (VIBRAMYCIN) 100 MG capsule Take 1 capsule (100 mg total) by mouth 2 (two) times daily. 20 capsule Tomi Bamberger, PA-C      PDMP not reviewed this encounter.   Tomi Bamberger, PA-C 07/22/21 1722

## 2021-10-31 ENCOUNTER — Ambulatory Visit (HOSPITAL_COMMUNITY)
Admission: EM | Admit: 2021-10-31 | Discharge: 2021-10-31 | Disposition: A | Discharge status: Home / Self Care | Payer: BC Managed Care – PPO | Attending: Physician Assistant | Admitting: Physician Assistant

## 2021-10-31 ENCOUNTER — Encounter (HOSPITAL_COMMUNITY): Payer: Self-pay | Admitting: Physician Assistant

## 2021-10-31 DIAGNOSIS — R197 Diarrhea, unspecified: Secondary | ICD-10-CM | POA: Diagnosis present

## 2021-10-31 DIAGNOSIS — Z87891 Personal history of nicotine dependence: Secondary | ICD-10-CM | POA: Diagnosis not present

## 2021-10-31 LAB — CBC WITH DIFFERENTIAL/PLATELET
Abs Immature Granulocytes: 0.05 10*3/uL (ref 0.00–0.07)
Basophils Absolute: 0 10*3/uL (ref 0.0–0.1)
Basophils Relative: 0 %
Eosinophils Absolute: 0.1 10*3/uL (ref 0.0–0.5)
Eosinophils Relative: 1 %
HCT: 48 % (ref 39.0–52.0)
Hemoglobin: 15.7 g/dL (ref 13.0–17.0)
Immature Granulocytes: 1 %
Lymphocytes Relative: 13 %
Lymphs Abs: 1.3 10*3/uL (ref 0.7–4.0)
MCH: 29.3 pg (ref 26.0–34.0)
MCHC: 32.7 g/dL (ref 30.0–36.0)
MCV: 89.7 fL (ref 80.0–100.0)
Monocytes Absolute: 1.4 10*3/uL — ABNORMAL HIGH (ref 0.1–1.0)
Monocytes Relative: 14 %
Neutro Abs: 7.5 10*3/uL (ref 1.7–7.7)
Neutrophils Relative %: 71 %
Platelets: 329 10*3/uL (ref 150–400)
RBC: 5.35 MIL/uL (ref 4.22–5.81)
RDW: 13.1 % (ref 11.5–15.5)
WBC: 10.4 10*3/uL (ref 4.0–10.5)
nRBC: 0 % (ref 0.0–0.2)

## 2021-10-31 LAB — C DIFFICILE QUICK SCREEN W PCR REFLEX
C Diff antigen: NEGATIVE
C Diff interpretation: NOT DETECTED
C Diff toxin: NEGATIVE

## 2021-10-31 LAB — POCT URINALYSIS DIPSTICK, ED / UC
Bilirubin Urine: NEGATIVE
Glucose, UA: NEGATIVE mg/dL
Hgb urine dipstick: NEGATIVE
Ketones, ur: NEGATIVE mg/dL
Leukocytes,Ua: NEGATIVE
Nitrite: NEGATIVE
Protein, ur: 30 mg/dL — AB
Specific Gravity, Urine: 1.02 (ref 1.005–1.030)
Urobilinogen, UA: 0.2 mg/dL (ref 0.0–1.0)
pH: 6.5 (ref 5.0–8.0)

## 2021-10-31 LAB — GASTROINTESTINAL PANEL BY PCR, STOOL (REPLACES STOOL CULTURE)

## 2021-10-31 LAB — COMPREHENSIVE METABOLIC PANEL
ALT: 27 U/L (ref 0–44)
AST: 22 U/L (ref 15–41)
Albumin: 3.5 g/dL (ref 3.5–5.0)
Alkaline Phosphatase: 69 U/L (ref 38–126)
Anion gap: 8 (ref 5–15)
BUN: 27 mg/dL — ABNORMAL HIGH (ref 6–20)
CO2: 24 mmol/L (ref 22–32)
Calcium: 8.4 mg/dL — ABNORMAL LOW (ref 8.9–10.3)
Chloride: 103 mmol/L (ref 98–111)
Creatinine, Ser: 1.29 mg/dL — ABNORMAL HIGH (ref 0.61–1.24)
GFR, Estimated: >60 mL/min (ref >60)
Glucose, Bld: 114 mg/dL — ABNORMAL HIGH (ref 70–99)
Potassium: 3 mmol/L — ABNORMAL LOW (ref 3.5–5.1)
Sodium: 135 mmol/L (ref 135–145)
Total Bilirubin: 0.6 mg/dL (ref 0.3–1.2)
Total Protein: 7.2 g/dL (ref 6.5–8.1)

## 2021-10-31 LAB — LIPASE, BLOOD: Lipase: 27 U/L (ref 11–51)

## 2021-10-31 NOTE — Discharge Instructions (Signed)
I will contact you with your labs as soon as I have them to determine our treatment plan.  In the meantime I recommend that you push fluids and eat a bland diet.  If your symptoms or not improving and your blood work/stool studies are negative I recommend following up with a GI specialist.  Please call to schedule an appointment.  If anything worsens and you develop severe abdominal pain, fever, blood in your stool, nausea/vomiting interfering with oral intake you need to go to the emergency room immediately.

## 2021-10-31 NOTE — ED Triage Notes (Signed)
Pt reports vomiting and diarrhea since WED.

## 2021-10-31 NOTE — ED Provider Notes (Signed)
Mike Watkins    CSN: [id number] Arrival date & time: 10/31/21  1007      History   Chief Complaint Chief Complaint  Patient presents with   Emesis   Diarrhea    HPI Mike Watkins is a 56 y.o. male.   Patient presents today with a 4-day history of GI symptoms.  He reports 1 episode of nausea and vomiting but is primarily concerned about ongoing diarrhea.  He reports many bowel movements per day going every few minutes which he describes as watery without blood or mucus.  He will have abdominal cramping prior to bowel movement but denies ongoing abdominal pain.  He denies any fever, chest pain, shortness of breath, headache, dizziness, URI symptoms.  Denies any known sick contacts.  Denies any recent travel.  Denies suspicious food intake, medication changes, recent antibiotic use.  He denies history of gastrointestinal disorder.  Denies previous abdominal surgery.  He has not been eating due to concern for having to have a bowel movement but has been pushing fluids despite symptoms.   Past Medical History:  Diagnosis Date   Arthritis    Hypertension    Penile lesion     Patient Active Problem List   Diagnosis Date Noted   Right upper extremity numbness 08/14/2018   Chronic pain of left knee 02/07/2017   Fall -multiple falls in the past year. 08/29/2016   Acute pain of right knee 08/29/2016   Contusion of right hand 07/14/2016   Ganglion of flexor tendon sheath of right middle finger 06/02/2016   Impingement syndrome of left shoulder 04/20/2016    Past Surgical History:  Procedure Laterality Date   ANTERIOR CERVICAL DECOMP/DISCECTOMY FUSION  12-31-2002   C5 -- C7   CYST EXCISION     pain in r/hand-middle finger   HAND SURGERY     PENILE BIOPSY N/A 08/23/2012   Procedure: EXCISIONAL BIOPSY OF PENILE LESION;  Surgeon: Molli Hazard, MD;  Location: [facility];  Service: Urology;  Laterality: N/A;  EXCISIONAL BIOPSY OF PENILE LESION      POSTERIOR FUSION CERVICAL SPINE  02-26-2005   C5 -- C7       Home Medications    Prior to Admission medications   Medication Sig Start Date End Date Taking? Authorizing Provider  acetaminophen (TYLENOL) 500 MG tablet Take 1,000 mg by mouth every 6 (six) hours as needed for moderate pain.    [provider]  albuterol (VENTOLIN HFA) 108 (90 Base) MCG/ACT inhaler Inhale 1-2 puffs into the lungs every 6 (six) hours as needed for wheezing or shortness of breath. 04/22/20   Jaynee Eagles, PA-C  doxycycline (VIBRAMYCIN) 100 MG capsule Take 1 capsule (100 mg total) by mouth 2 (two) times daily. 07/22/21   Francene Finders, PA-C  famotidine (PEPCID) 40 MG tablet Take 1 tablet (40 mg total) by mouth daily for 10 days. 05/09/21 05/19/21  Lynden Oxford Scales, PA-C  guaiFENesin (MUCINEX) 600 MG 12 hr tablet Take 1 tablet (600 mg total) by mouth 2 (two) times daily. 02/24/21   White, Leitha Schuller, NP  ibuprofen (ADVIL) 800 MG tablet Take 1 tablet (800 mg total) by mouth 3 (three) times daily. 05/09/21   Lynden Oxford Scales, PA-C  Miconazole Nitrate 2 % POWD Apply to affected area twice daily for 7 days 03/23/21   Chase Picket, MD  morphine (MSIR) 15 MG tablet Take 1 tablet (15 mg total) by mouth every 4 (four) hours as needed  for severe pain. 08/10/18   Melene PlanFloyd, Dan, DO  promethazine-dextromethorphan (PROMETHAZINE-DM) 6.25-15 MG/5ML syrup Take 5 mLs by mouth 4 (four) times daily as needed for cough. 02/24/21   White, Elita BooneAdrienne R, NP  traMADol (ULTRAM) 50 MG tablet Take 1 tablet (50 mg total) by mouth at bedtime as needed. 11/14/18   Eldred MangesYates, Mark C, MD    Family History Family History  Problem Relation Age of Onset   Hypertension Mother    Diabetes Mother    Hypertension Father    Cancer Father     Social History Social History   Tobacco Use   Smoking status: Former    Packs/day: 3.00    Years: 21.00    Pack years: 63.00    Types: Cigarettes    Quit date: 08/18/2002    Years since  quitting: 19.2   Smokeless tobacco: Former  Substance Use Topics   Alcohol use: Yes    Comment: RARE   Drug use: No     Allergies   Penicillins and Sulfa antibiotics   Review of Systems Review of Systems  Constitutional:  Positive for activity change and appetite change. Negative for fatigue and fever.  Respiratory:  Negative for shortness of breath.   Cardiovascular:  Negative for chest pain.  Gastrointestinal:  Positive for diarrhea, nausea and vomiting. Negative for abdominal pain, blood in stool and constipation.  Neurological:  Negative for dizziness, light-headedness and headaches.    Physical Exam Triage Vital Signs ED Triage Vitals [10/31/21 1037]  Enc Vitals Group     BP (!) 144/97     Pulse Rate (!) 104     Resp 20     Temp 97.9 F (36.6 C)     Temp src      SpO2 97 %     Weight      Height      Head Circumference      Peak Flow      Pain Score      Pain Loc      Pain Edu?      Excl. in GC?    No data found.  Updated Vital Signs BP (!) 144/94   Pulse (!) 104   Temp 98.6 F (37 C)   Resp 20   SpO2 96%   Visual Acuity Right Eye Distance:   Left Eye Distance:   Bilateral Distance:    Right Eye Near:   Left Eye Near:    Bilateral Near:     Physical Exam Vitals reviewed.  Constitutional:      General: He is awake.     Appearance: Normal appearance. He is well-developed. He is not ill-appearing.     Comments: Very pleasant male appears stated age in no acute distress  HENT:     Head: Normocephalic and atraumatic.  Cardiovascular:     Rate and Rhythm: Regular rhythm. Tachycardia present.     Heart sounds: Normal heart sounds, S1 normal and S2 normal. No murmur heard. Pulmonary:     Effort: Pulmonary effort is normal.     Breath sounds: Normal breath sounds. No stridor. No wheezing, rhonchi or rales.     Comments: Clear to auscultation bilaterally Abdominal:     General: Bowel sounds are normal.     Palpations: Abdomen is soft.      Tenderness: There is abdominal tenderness in the epigastric area and left upper quadrant. There is no right CVA tenderness, left CVA tenderness, guarding or rebound.  Neurological:  Mental Status: He is alert.  Psychiatric:        Behavior: Behavior is cooperative.     UC Treatments / Results  Labs (all labs ordered are listed, but only abnormal results are displayed) Labs Reviewed  POCT URINALYSIS DIPSTICK, ED / UC - Abnormal; Notable for the following components:      Result Value   Protein, ur 30 (*)    All other components within normal limits  GASTROINTESTINAL PANEL BY PCR, STOOL (REPLACES STOOL CULTURE)  C DIFFICILE QUICK SCREEN W PCR REFLEX    CBC WITH DIFFERENTIAL/PLATELET  COMPREHENSIVE METABOLIC PANEL  LIPASE, BLOOD    EKG   Radiology No results found.  Procedures Procedures (including critical care time)  Medications Ordered in UC Medications - No data to display  Initial Impression / Assessment and Plan / UC Course  I have reviewed the triage vital signs and the nursing notes.  Pertinent labs & imaging results that were available during my care of the patient were reviewed by me and considered in my medical decision making (see chart for details).     Vital signs and physical exam reassuring today; no indication for emergent evaluation or imaging.  UA obtained showed mild proteinuria but no significant dehydration.  Patient was encouraged to aggressively orally hydrate.  CBC, CMP, lipase obtained today-results pending.  Given severity of diarrhea will obtain C. difficile and GI panel to rule out infectious etiology.  Discussed that if blood work comes back normal and he continues to have significant diarrhea he should follow-up with a GI specialist and was given contact information for local provider with instruction to call to schedule an appointment soon as possible if symptoms have not resolved and etiology was not identified on work-up.  Recommended he eat  a bland diet.  Discussed that if anything worsens and he has persistent severe diarrhea, melena, hematochezia, abdominal pain, fever, nausea/vomiting interfering with oral intake, weakness he needs to go to the emergency room immediately to which he expressed understanding.  Strict return precautions given.  Work excuse note provided.  Final Clinical Impressions(s) / UC Diagnoses   Final diagnoses:  Diarrhea of presumed infectious origin     Discharge Instructions      I will contact you with your labs as soon as I have them to determine our treatment plan.  In the meantime I recommend that you push fluids and eat a bland diet.  If your symptoms or not improving and your blood work/stool studies are negative I recommend following up with a GI specialist.  Please call to schedule an appointment.  If anything worsens and you develop severe abdominal pain, fever, blood in your stool, nausea/vomiting interfering with oral intake you need to go to the emergency room immediately.     ED Prescriptions   None    PDMP not reviewed this encounter.   Terrilee Croak, PA-C 10/31/21 1239

## 2021-11-01 ENCOUNTER — Telehealth (HOSPITAL_COMMUNITY): Payer: Self-pay | Admitting: Physician Assistant

## 2021-11-01 MED ORDER — POTASSIUM CHLORIDE ER 20 MEQ PO TBCR: 20.0000 meq | EXTENDED_RELEASE_TABLET | Freq: Every day | ORAL | 0 refills | Status: DC

## 2021-11-01 NOTE — Telephone Encounter (Signed)
Patient was noted to be hypokalemic related to GI losses.  Potassium supplement sent to pharmacy.  See result note for patient instructions.

## 2022-05-18 ENCOUNTER — Encounter (HOSPITAL_COMMUNITY): Payer: Self-pay

## 2022-05-18 ENCOUNTER — Ambulatory Visit (HOSPITAL_COMMUNITY)
Admission: EM | Admit: 2022-05-18 | Discharge: 2022-05-18 | Disposition: A | Discharge status: Home / Self Care | Payer: BC Managed Care – PPO | Attending: Family Medicine | Admitting: Family Medicine

## 2022-05-18 DIAGNOSIS — M7989 Other specified soft tissue disorders: Secondary | ICD-10-CM | POA: Diagnosis present

## 2022-05-18 LAB — CBC WITH DIFFERENTIAL/PLATELET
Abs Immature Granulocytes: 0.06 10*3/uL (ref 0.00–0.07)
Basophils Absolute: 0.1 10*3/uL (ref 0.0–0.1)
Basophils Relative: 1 %
Eosinophils Absolute: 0.6 10*3/uL — ABNORMAL HIGH (ref 0.0–0.5)
Eosinophils Relative: 5 %
HCT: 46 % (ref 39.0–52.0)
Hemoglobin: 15.1 g/dL (ref 13.0–17.0)
Immature Granulocytes: 1 %
Lymphocytes Relative: 22 %
Lymphs Abs: 2.8 10*3/uL (ref 0.7–4.0)
MCH: 30.3 pg (ref 26.0–34.0)
MCHC: 32.8 g/dL (ref 30.0–36.0)
MCV: 92.4 fL (ref 80.0–100.0)
Monocytes Absolute: 0.9 10*3/uL (ref 0.1–1.0)
Monocytes Relative: 7 %
Neutro Abs: 8.5 10*3/uL — ABNORMAL HIGH (ref 1.7–7.7)
Neutrophils Relative %: 64 %
Platelets: 349 10*3/uL (ref 150–400)
RBC: 4.98 MIL/uL (ref 4.22–5.81)
RDW: 13.1 % (ref 11.5–15.5)
WBC: 12.8 10*3/uL — ABNORMAL HIGH (ref 4.0–10.5)
nRBC: 0 % (ref 0.0–0.2)

## 2022-05-18 LAB — COMPREHENSIVE METABOLIC PANEL
ALT: 18 U/L (ref 0–44)
AST: 18 U/L (ref 15–41)
Albumin: 3.5 g/dL (ref 3.5–5.0)
Alkaline Phosphatase: 84 U/L (ref 38–126)
Anion gap: 7 (ref 5–15)
BUN: 19 mg/dL (ref 6–20)
CO2: 28 mmol/L (ref 22–32)
Calcium: 9.1 mg/dL (ref 8.9–10.3)
Chloride: 105 mmol/L (ref 98–111)
Creatinine, Ser: 0.99 mg/dL (ref 0.61–1.24)
GFR, Estimated: >60 mL/min (ref >60)
Glucose, Bld: 90 mg/dL (ref 70–99)
Potassium: 3.8 mmol/L (ref 3.5–5.1)
Sodium: 140 mmol/L (ref 135–145)
Total Bilirubin: 0.5 mg/dL (ref 0.3–1.2)
Total Protein: 7.1 g/dL (ref 6.5–8.1)

## 2022-05-18 MED ORDER — DOXYCYCLINE HYCLATE 100 MG PO CAPS: 100.0000 mg | ORAL_CAPSULE | Freq: Two times a day (BID) | ORAL | 0 refills | Status: AC

## 2022-05-18 NOTE — ED Provider Notes (Signed)
[facility]    CSN: [id number] Arrival date & time: 05/18/22  1224      History   Chief Complaint Chief Complaint  Patient presents with   Mass    HPI Mike Watkins is a 56 y.o. male.   HPI Here for swelling at his right posterior scalp that is been going on for about a week.  It is sore and he has tried to squeeze it.  He thinks may be something needs to be drained.  No fever or chills.  He has had a cough for a long time but no upper respiratory symptoms that are recent.  No ear pain.  Past Medical History:  Diagnosis Date   Arthritis    Hypertension    Penile lesion     Patient Active Problem List   Diagnosis Date Noted   Right upper extremity numbness 08/14/2018   Chronic pain of left knee 02/07/2017   Fall -multiple falls in the past year. 08/29/2016   Acute pain of right knee 08/29/2016   Contusion of right hand 07/14/2016   Ganglion of flexor tendon sheath of right middle finger 06/02/2016   Impingement syndrome of left shoulder 04/20/2016    Past Surgical History:  Procedure Laterality Date   ANTERIOR CERVICAL DECOMP/DISCECTOMY FUSION  12-31-2002   C5 -- C7   CYST EXCISION     pain in r/hand-middle finger   HAND SURGERY     PENILE BIOPSY N/A 08/23/2012   Procedure: EXCISIONAL BIOPSY OF PENILE LESION;  Surgeon: Milford Cage, MD;  Location: [facility];  Service: Urology;  Laterality: N/A;  EXCISIONAL BIOPSY OF PENILE LESION     POSTERIOR FUSION CERVICAL SPINE  02-26-2005   C5 -- C7       Home Medications    Prior to Admission medications   Medication Sig Start Date End Date Taking? Authorizing Provider  doxycycline (VIBRAMYCIN) 100 MG capsule Take 1 capsule (100 mg total) by mouth 2 (two) times daily for 7 days. 05/18/22 05/25/22 Yes Malikhi Ogan, Janace Aris, MD  HYDROcodone-acetaminophen (NORCO) 7.5-325 MG tablet Take 1-2 tablets by mouth 2 (two) times daily as needed. 05/05/22  Yes [provider]   ibuprofen (ADVIL) 800 MG tablet Take 1 tablet (800 mg total) by mouth 3 (three) times daily. 05/09/21  Yes Theadora Rama Scales, PA-C  traMADol (ULTRAM) 50 MG tablet Take 1 tablet (50 mg total) by mouth at bedtime as needed. 11/14/18  Yes Eldred Manges, MD  acetaminophen (TYLENOL) 500 MG tablet Take 1,000 mg by mouth every 6 (six) hours as needed for moderate pain.    [provider]  albuterol (VENTOLIN HFA) 108 (90 Base) MCG/ACT inhaler Inhale 1-2 puffs into the lungs every 6 (six) hours as needed for wheezing or shortness of breath. 04/22/20   Wallis Bamberg, PA-C  famotidine (PEPCID) 40 MG tablet Take 1 tablet (40 mg total) by mouth daily for 10 days. 05/09/21 05/19/21  Theadora Rama Scales, PA-C  guaiFENesin (MUCINEX) 600 MG 12 hr tablet Take 1 tablet (600 mg total) by mouth 2 (two) times daily. 02/24/21   Valinda Hoar, NP  Miconazole Nitrate 2 % POWD Apply to affected area twice daily for 7 days 03/23/21   Lamptey, Britta Mccreedy, MD  morphine (MSIR) 15 MG tablet Take 1 tablet (15 mg total) by mouth every 4 (four) hours as needed for severe pain. 08/10/18   Melene Plan, DO  potassium chloride 20 MEQ TBCR Take 20 mEq by  mouth daily. 11/01/21   Raspet, Noberto Retort, PA-C  promethazine-dextromethorphan (PROMETHAZINE-DM) 6.25-15 MG/5ML syrup Take 5 mLs by mouth 4 (four) times daily as needed for cough. 02/24/21   Valinda Hoar, NP    Family History Family History  Problem Relation Age of Onset   Hypertension Mother    Diabetes Mother    Hypertension Father    Cancer Father     Social History Social History   Tobacco Use   Smoking status: Former    Packs/day: 3.00    Years: 21.00    Total pack years: 63.00    Types: Cigarettes    Quit date: 08/18/2002    Years since quitting: 19.7   Smokeless tobacco: Former  Substance Use Topics   Alcohol use: Yes    Comment: RARE   Drug use: No     Allergies   Penicillins and Sulfa antibiotics   Review of Systems Review of  Systems   Physical Exam Triage Vital Signs ED Triage Vitals  Enc Vitals Group     BP 05/18/22 1337 (!) 143/85     Pulse Rate 05/18/22 1337 96     Resp 05/18/22 1337 16     Temp 05/18/22 1337 (!) 97.5 F (36.4 C)     Temp Source 05/18/22 1337 Oral     SpO2 05/18/22 1337 96 %     Weight --      Height --      Head Circumference --      Peak Flow --      Pain Score 05/18/22 1335 8     Pain Loc --      Pain Edu? --      Excl. in GC? --    No data found.  Updated Vital Signs BP (!) 143/85 (BP Location: Right Arm)   Pulse 96   Temp (!) 97.5 F (36.4 C) (Oral)   Resp 16   SpO2 96%   Visual Acuity Right Eye Distance:   Left Eye Distance:   Bilateral Distance:    Right Eye Near:   Left Eye Near:    Bilateral Near:     Physical Exam Vitals reviewed.  Constitutional:      General: He is not in acute distress.    Appearance: He is not ill-appearing, toxic-appearing or diaphoretic.  HENT:     Head:     Comments: There is some swelling of the soft tissue about 5 cm in diameter at the right posterior scalp posterior to the mastoid.  There is no fluctuance.  It is tender some.  There is really not any erythema of that area either.    Nose: Nose normal.     Mouth/Throat:     Mouth: Mucous membranes are moist.  Eyes:     Extraocular Movements: Extraocular movements intact.     Pupils: Pupils are equal, round, and reactive to light.  Cardiovascular:     Rate and Rhythm: Normal rate and regular rhythm.  Pulmonary:     Effort: Pulmonary effort is normal.     Breath sounds: Normal breath sounds.  Musculoskeletal:     Cervical back: Neck supple.  Lymphadenopathy:     Cervical: No cervical adenopathy.  Neurological:     General: No focal deficit present.     Mental Status: He is alert and oriented to person, place, and time.     Coordination: Coordination abnormal.  Psychiatric:        Behavior: Behavior normal.  UC Treatments / Results  Labs (all labs ordered  are listed, but only abnormal results are displayed) Labs Reviewed  CBC WITH DIFFERENTIAL/PLATELET  COMPREHENSIVE METABOLIC PANEL    EKG   Radiology No results found.  Procedures Procedures (including critical care time)  Medications Ordered in UC Medications - No data to display  Initial Impression / Assessment and Plan / UC Course  I have reviewed the triage vital signs and the nursing notes.  Pertinent labs & imaging results that were available during my care of the patient were reviewed by me and considered in my medical decision making (see chart for details).        This is not consistent with an abscess or cellulitis.  Since this could be lymphadenitis, I am going to do prescription for doxycycline I discussed with the patient that he needs to see his primary care office at [facility] about this issue.  He is given contact information for ENT.  Also CBC and CMP are drawn today Final Clinical Impressions(s) / UC Diagnoses   Final diagnoses:  Mass of soft tissue     Discharge Instructions      Take doxycycline 100 mg --1 capsule 2 times daily for 7 days  We have drawn blood for a blood count and chemistry panel.  Staff will notify you if there is anything significantly abnormal that needs treatment.  Please follow-up with your primary care     ED Prescriptions     Medication Sig Dispense Auth. Provider   doxycycline (VIBRAMYCIN) 100 MG capsule Take 1 capsule (100 mg total) by mouth 2 (two) times daily for 7 days. 14 capsule Marlinda Mike, Janace Aris, MD      PDMP not reviewed this encounter.   Zenia Resides, MD 05/18/22 1500

## 2022-05-18 NOTE — ED Triage Notes (Signed)
Chief Complaint: mass on the right side of the neck. States it is painful. Patient tried to pop it but nothing came out.   Onset: 1 week   OTC medications tried: Yes- OTC cream     with no relief

## 2022-05-18 NOTE — Discharge Instructions (Signed)
Take doxycycline 100 mg --1 capsule 2 times daily for 7 days  We have drawn blood for a blood count and chemistry panel.  Staff will notify you if there is anything significantly abnormal that needs treatment.  Please follow-up with your primary care

## 2022-05-26 ENCOUNTER — Encounter (HOSPITAL_COMMUNITY): Payer: Self-pay

## 2022-05-26 ENCOUNTER — Ambulatory Visit (HOSPITAL_COMMUNITY)
Admission: EM | Admit: 2022-05-26 | Discharge: 2022-05-26 | Disposition: A | Discharge status: Home / Self Care | Payer: BC Managed Care – PPO | Attending: Emergency Medicine | Admitting: Emergency Medicine

## 2022-05-26 DIAGNOSIS — R0602 Shortness of breath: Secondary | ICD-10-CM | POA: Diagnosis not present

## 2022-05-26 DIAGNOSIS — J069 Acute upper respiratory infection, unspecified: Secondary | ICD-10-CM | POA: Diagnosis not present

## 2022-05-26 DIAGNOSIS — R062 Wheezing: Secondary | ICD-10-CM | POA: Diagnosis not present

## 2022-05-26 MED ORDER — PREDNISONE 20 MG PO TABS: 40.0000 mg | ORAL_TABLET | Freq: Every day | ORAL | 0 refills | Status: DC

## 2022-05-26 MED ORDER — AZITHROMYCIN 250 MG PO TABS: 250.0000 mg | ORAL_TABLET | Freq: Every day | ORAL | 0 refills | Status: DC

## 2022-05-26 MED ORDER — PROMETHAZINE-DM 6.25-15 MG/5ML PO SYRP: 5.0000 mL | ORAL_SOLUTION | Freq: Four times a day (QID) | ORAL | 0 refills | Status: DC | PRN

## 2022-05-26 NOTE — Discharge Instructions (Signed)
Your symptoms today are most likely being caused by a virus and should steadily improve in time it can take up to 7 to 10 days before you truly start to see a turnaround however things will get better  You are experiencing shortness of breath and wheezing which can be heard on your exam, and an antibiotic has been started prophylactically to ensure that your symptoms do not progress  Take azithromycin as directed  Begin prednisone every morning with food for 5 days, this reduces inflammation and helps to relax the airway which ideally will resolve your shortness of breath and wheezing  You may use cough syrup every 6 hours as needed for additional comfort, be mindful this may make you drowsy    You can take Tylenol and/or Ibuprofen as needed for fever reduction and pain relief.   For cough: honey 1/2 to 1 teaspoon (you can dilute the honey in water or another fluid).  You can also use guaifenesin and dextromethorphan for cough. You can use a humidifier for chest congestion and cough.  If you don't have a humidifier, you can sit in the bathroom with the hot shower running.      For sore throat: try warm salt water gargles, cepacol lozenges, throat spray, warm tea or water with lemon/honey, popsicles or ice, or OTC cold relief medicine for throat discomfort.   For congestion: take a daily anti-histamine like Zyrtec, Claritin, and a oral decongestant, such as pseudoephedrine.  You can also use Flonase 1-2 sprays in each nostril daily.   It is important to stay hydrated: drink plenty of fluids (water, gatorade/powerade/pedialyte, juices, or teas) to keep your throat moisturized and help further relieve irritation/discomfort.

## 2022-05-26 NOTE — ED Provider Notes (Signed)
[facility]    CSN: [id number] Arrival date & time: 05/26/22  4801      History   Chief Complaint Chief Complaint  Patient presents with   Shortness of Breath   Cough   Nasal Congestion   Dizziness    HPI Mike Watkins is a 56 y.o. male.   Patient presents with nasal congestion, rhinorrhea, subjective fever, shortness of breath, wheezing and a productive cough for 4 days.  Cough produces clear sputum, shortness of breath is experienced with exertion, improves with rest, wheezing can be heard throughout the day.  Known exposure to RSV but endorses he has different symptoms.  Has attempted use of ibuprofen, Mucinex and NyQuil with minimal improvement.  Denies respiratory history.  Denies tobacco use.   Past Medical History:  Diagnosis Date   Arthritis    Hypertension    Penile lesion     Patient Active Problem List   Diagnosis Date Noted   Right upper extremity numbness 08/14/2018   Chronic pain of left knee 02/07/2017   Fall -multiple falls in the past year. 08/29/2016   Acute pain of right knee 08/29/2016   Contusion of right hand 07/14/2016   Ganglion of flexor tendon sheath of right middle finger 06/02/2016   Impingement syndrome of left shoulder 04/20/2016    Past Surgical History:  Procedure Laterality Date   ANTERIOR CERVICAL DECOMP/DISCECTOMY FUSION  12-31-2002   C5 -- C7   CYST EXCISION     pain in r/hand-middle finger   HAND SURGERY     PENILE BIOPSY N/A 08/23/2012   Procedure: EXCISIONAL BIOPSY OF PENILE LESION;  Surgeon: Milford Cage, MD;  Location: [facility];  Service: Urology;  Laterality: N/A;  EXCISIONAL BIOPSY OF PENILE LESION     POSTERIOR FUSION CERVICAL SPINE  02-26-2005   C5 -- C7       Home Medications    Prior to Admission medications   Medication Sig Start Date End Date Taking? Authorizing Provider  acetaminophen (TYLENOL) 500 MG tablet Take 1,000 mg by mouth every 6 (six) hours as needed for  moderate pain.    [provider]  albuterol (VENTOLIN HFA) 108 (90 Base) MCG/ACT inhaler Inhale 1-2 puffs into the lungs every 6 (six) hours as needed for wheezing or shortness of breath. 04/22/20   Wallis Bamberg, PA-C  famotidine (PEPCID) 40 MG tablet Take 1 tablet (40 mg total) by mouth daily for 10 days. 05/09/21 05/19/21  Theadora Rama Scales, PA-C  guaiFENesin (MUCINEX) 600 MG 12 hr tablet Take 1 tablet (600 mg total) by mouth 2 (two) times daily. 02/24/21   Valinda Hoar, NP  HYDROcodone-acetaminophen (NORCO) 7.5-325 MG tablet Take 1-2 tablets by mouth 2 (two) times daily as needed. 05/05/22   [provider]  ibuprofen (ADVIL) 800 MG tablet Take 1 tablet (800 mg total) by mouth 3 (three) times daily. 05/09/21   Theadora Rama Scales, PA-C  Miconazole Nitrate 2 % POWD Apply to affected area twice daily for 7 days 03/23/21   Merrilee Jansky, MD  morphine (MSIR) 15 MG tablet Take 1 tablet (15 mg total) by mouth every 4 (four) hours as needed for severe pain. 08/10/18   Melene Plan, DO  potassium chloride 20 MEQ TBCR Take 20 mEq by mouth daily. 11/01/21   Raspet, Noberto Retort, PA-C  promethazine-dextromethorphan (PROMETHAZINE-DM) 6.25-15 MG/5ML syrup Take 5 mLs by mouth 4 (four) times daily as needed for cough. 02/24/21   Kenya Kook, Elita Boone,  NP  traMADol (ULTRAM) 50 MG tablet Take 1 tablet (50 mg total) by mouth at bedtime as needed. 11/14/18   Eldred Manges, MD    Family History Family History  Problem Relation Age of Onset   Hypertension Mother    Diabetes Mother    Hypertension Father    Cancer Father     Social History Social History   Tobacco Use   Smoking status: Former    Packs/day: 3.00    Years: 21.00    Total pack years: 63.00    Types: Cigarettes    Quit date: 08/18/2002    Years since quitting: 19.7   Smokeless tobacco: Former  Substance Use Topics   Alcohol use: Yes    Comment: RARE   Drug use: No     Allergies   Penicillins and Sulfa  antibiotics   Review of Systems Review of Systems  Constitutional:  Positive for fever. Negative for activity change, appetite change, chills, diaphoresis, fatigue and unexpected weight change.  HENT:  Positive for congestion and rhinorrhea. Negative for dental problem, drooling, ear discharge, ear pain, facial swelling, hearing loss, mouth sores, nosebleeds, postnasal drip, sinus pressure, sinus pain, sneezing, sore throat, tinnitus, trouble swallowing and voice change.   Respiratory:  Positive for cough, shortness of breath and wheezing. Negative for apnea, choking, chest tightness and stridor.   Cardiovascular: Negative.   Gastrointestinal: Negative.   Skin: Negative.   Neurological:  Positive for dizziness. Negative for tremors, seizures, syncope, facial asymmetry, speech difficulty, weakness, light-headedness, numbness and headaches.     Physical Exam Triage Vital Signs ED Triage Vitals [05/26/22 0919]  Enc Vitals Group     BP (!) 181/84     Pulse Rate (!) 111     Resp 18     Temp 98.3 F (36.8 C)     Temp Source Oral     SpO2 94 %     Weight      Height      Head Circumference      Peak Flow      Pain Score      Pain Loc      Pain Edu?      Excl. in GC?    No data found.  Updated Vital Signs BP (!) 181/84 (BP Location: Right Arm)   Pulse (!) 111   Temp 98.3 F (36.8 C) (Oral)   Resp 18   SpO2 94%   Visual Acuity Right Eye Distance:   Left Eye Distance:   Bilateral Distance:    Right Eye Near:   Left Eye Near:    Bilateral Near:     Physical Exam Constitutional:      Appearance: Normal appearance. He is well-developed.  HENT:     Head: Normocephalic.     Right Ear: Hearing, ear canal and external ear normal. A middle ear effusion is present.     Left Ear: Hearing, ear canal and external ear normal. A middle ear effusion is present.     Nose: Congestion and rhinorrhea present.     Mouth/Throat:     Mouth: Mucous membranes are moist.     Pharynx:  Posterior oropharyngeal erythema present.  Eyes:     Extraocular Movements: Extraocular movements intact.  Cardiovascular:     Rate and Rhythm: Normal rate and regular rhythm.     Pulses: Normal pulses.     Heart sounds: Normal heart sounds.  Pulmonary:     Effort: Pulmonary effort is normal.  Breath sounds: Examination of the right-upper field reveals wheezing. Examination of the left-upper field reveals wheezing. Wheezing present.     Comments: Audible wheeze present  Skin:    General: Skin is warm and dry.  Neurological:     Mental Status: He is alert and oriented to person, place, and time. Mental status is at baseline.  Psychiatric:        Mood and Affect: Mood normal.        Behavior: Behavior normal.      UC Treatments / Results  Labs (all labs ordered are listed, but only abnormal results are displayed) Labs Reviewed - No data to display  EKG   Radiology No results found.  Procedures Procedures (including critical care time)  Medications Ordered in UC Medications - No data to display  Initial Impression / Assessment and Plan / UC Course  I have reviewed the triage vital signs and the nursing notes.  Pertinent labs & imaging results that were available during my care of the patient were reviewed by me and considered in my medical decision making (see chart for details).   Viral uri with cough  Patient is in no signs of distress nor toxic appearing.  Vital signs are stable.  Low suspicion for pneumonia, pneumothorax or bronchitis and therefore will defer imaging.   Prescribed Promethazine DM, prednisone and azithromycin, antibiotic prescribed prophylactically to prevent progression of symptoms due to extent of respiratory symptoms    May use additional over-the-counter medications as needed for supportive care.  May follow-up with urgent care as needed if symptoms persist or worsen.  Note given.   Final Clinical Impressions(s) / UC Diagnoses   Final  diagnoses:  None   Discharge Instructions   None    ED Prescriptions   None    PDMP not reviewed this encounter.   Valinda Hoar, [place] 05/26/22 [phone]

## 2022-05-26 NOTE — ED Triage Notes (Signed)
Pt is here for cough , wheezing, shortness of breath , nasal congestion, runny nose  x 3days

## 2022-05-29 ENCOUNTER — Emergency Department (HOSPITAL_COMMUNITY): Payer: BC Managed Care – PPO

## 2022-05-29 ENCOUNTER — Other Ambulatory Visit: Payer: Self-pay

## 2022-05-29 ENCOUNTER — Encounter (HOSPITAL_COMMUNITY): Payer: Self-pay

## 2022-05-29 ENCOUNTER — Ambulatory Visit (HOSPITAL_COMMUNITY)
Admission: EM | Admit: 2022-05-29 | Discharge: 2022-05-29 | Disposition: A | Discharge status: Home / Self Care | Payer: BC Managed Care – PPO | Attending: Emergency Medicine | Admitting: Emergency Medicine

## 2022-05-29 ENCOUNTER — Inpatient Hospital Stay (HOSPITAL_COMMUNITY)
Admission: EM | Admit: 2022-05-29 | Discharge: 2022-06-02 | DRG: 193 | Disposition: A | Discharge status: Home / Self Care | Payer: BC Managed Care – PPO | Attending: Internal Medicine | Admitting: Internal Medicine

## 2022-05-29 DIAGNOSIS — E876 Hypokalemia: Secondary | ICD-10-CM | POA: Diagnosis not present

## 2022-05-29 DIAGNOSIS — Z6841 Body Mass Index (BMI) 40.0 and over, adult: Secondary | ICD-10-CM

## 2022-05-29 DIAGNOSIS — R0902 Hypoxemia: Principal | ICD-10-CM

## 2022-05-29 DIAGNOSIS — Z809 Family history of malignant neoplasm, unspecified: Secondary | ICD-10-CM

## 2022-05-29 DIAGNOSIS — G8929 Other chronic pain: Secondary | ICD-10-CM | POA: Diagnosis present

## 2022-05-29 DIAGNOSIS — Z79899 Other long term (current) drug therapy: Secondary | ICD-10-CM

## 2022-05-29 DIAGNOSIS — J9601 Acute respiratory failure with hypoxia: Secondary | ICD-10-CM | POA: Diagnosis present

## 2022-05-29 DIAGNOSIS — R6520 Severe sepsis without septic shock: Secondary | ICD-10-CM

## 2022-05-29 DIAGNOSIS — E669 Obesity, unspecified: Secondary | ICD-10-CM | POA: Diagnosis present

## 2022-05-29 DIAGNOSIS — Z833 Family history of diabetes mellitus: Secondary | ICD-10-CM

## 2022-05-29 DIAGNOSIS — M25562 Pain in left knee: Secondary | ICD-10-CM | POA: Diagnosis present

## 2022-05-29 DIAGNOSIS — I10 Essential (primary) hypertension: Secondary | ICD-10-CM | POA: Diagnosis not present

## 2022-05-29 DIAGNOSIS — Z8616 Personal history of COVID-19: Secondary | ICD-10-CM

## 2022-05-29 DIAGNOSIS — Z87891 Personal history of nicotine dependence: Secondary | ICD-10-CM

## 2022-05-29 DIAGNOSIS — J09X1 Influenza due to identified novel influenza A virus with pneumonia: Secondary | ICD-10-CM

## 2022-05-29 DIAGNOSIS — Z981 Arthrodesis status: Secondary | ICD-10-CM

## 2022-05-29 DIAGNOSIS — Z20822 Contact with and (suspected) exposure to covid-19: Secondary | ICD-10-CM | POA: Diagnosis present

## 2022-05-29 DIAGNOSIS — Z880 Allergy status to penicillin: Secondary | ICD-10-CM

## 2022-05-29 DIAGNOSIS — A419 Sepsis, unspecified organism: Secondary | ICD-10-CM | POA: Diagnosis not present

## 2022-05-29 DIAGNOSIS — J1000 Influenza due to other identified influenza virus with unspecified type of pneumonia: Principal | ICD-10-CM | POA: Diagnosis present

## 2022-05-29 DIAGNOSIS — R062 Wheezing: Secondary | ICD-10-CM | POA: Diagnosis not present

## 2022-05-29 DIAGNOSIS — Z8249 Family history of ischemic heart disease and other diseases of the circulatory system: Secondary | ICD-10-CM

## 2022-05-29 DIAGNOSIS — J101 Influenza due to other identified influenza virus with other respiratory manifestations: Secondary | ICD-10-CM

## 2022-05-29 DIAGNOSIS — Z882 Allergy status to sulfonamides status: Secondary | ICD-10-CM

## 2022-05-29 LAB — CBC
HCT: 43.7 % (ref 39.0–52.0)
Hemoglobin: 14.4 g/dL (ref 13.0–17.0)
MCH: 30.6 pg (ref 26.0–34.0)
MCHC: 33 g/dL (ref 30.0–36.0)
MCV: 92.8 fL (ref 80.0–100.0)
Platelets: 255 10*3/uL (ref 150–400)
RBC: 4.71 MIL/uL (ref 4.22–5.81)
RDW: 12.9 % (ref 11.5–15.5)
WBC: 10.1 10*3/uL (ref 4.0–10.5)
nRBC: 0 % (ref 0.0–0.2)

## 2022-05-29 LAB — RESP PANEL BY RT-PCR (RSV, FLU A&B, COVID)  RVPGX2
Influenza A by PCR: POSITIVE — AB
Influenza B by PCR: NEGATIVE
Resp Syncytial Virus by PCR: NEGATIVE
SARS Coronavirus 2 by RT PCR: NEGATIVE

## 2022-05-29 LAB — BASIC METABOLIC PANEL
Anion gap: 9 (ref 5–15)
BUN: 21 mg/dL — ABNORMAL HIGH (ref 6–20)
CO2: 29 mmol/L (ref 22–32)
Calcium: 8.8 mg/dL — ABNORMAL LOW (ref 8.9–10.3)
Chloride: 101 mmol/L (ref 98–111)
Creatinine, Ser: 0.94 mg/dL (ref 0.61–1.24)
GFR, Estimated: >60 mL/min (ref >60)
Glucose, Bld: 127 mg/dL — ABNORMAL HIGH (ref 70–99)
Potassium: 3.3 mmol/L — ABNORMAL LOW (ref 3.5–5.1)
Sodium: 139 mmol/L (ref 135–145)

## 2022-05-29 LAB — LACTIC ACID, PLASMA: Lactic Acid, Venous: 1.2 mmol/L (ref 0.5–1.9)

## 2022-05-29 MED ORDER — IPRATROPIUM BROMIDE 0.02 % IN SOLN
0.5000 mg | Freq: Once | RESPIRATORY_TRACT | Status: AC
  Administered 2022-05-29: 0.5 mg via RESPIRATORY_TRACT
  Filled 2022-05-29: qty 2.5

## 2022-05-29 MED ORDER — SODIUM CHLORIDE 0.9 % IV BOLUS
1000.0000 mL | Freq: Once | INTRAVENOUS | Status: AC
Start: 2022-05-29 — End: 2022-05-30
  Administered 2022-05-29: 1000 mL via INTRAVENOUS

## 2022-05-29 MED ORDER — ACETAMINOPHEN 325 MG PO TABS
650.0000 mg | ORAL_TABLET | Freq: Once | ORAL | Status: AC
  Administered 2022-05-29: 650 mg via ORAL

## 2022-05-29 MED ORDER — ALBUTEROL SULFATE (2.5 MG/3ML) 0.083% IN NEBU
5.0000 mg | INHALATION_SOLUTION | Freq: Once | RESPIRATORY_TRACT | Status: AC
  Administered 2022-05-29: 5 mg via RESPIRATORY_TRACT
  Filled 2022-05-29: qty 6

## 2022-05-29 MED ORDER — ACETAMINOPHEN 325 MG PO TABS
ORAL_TABLET | ORAL | Status: AC
  Filled 2022-05-29: qty 2

## 2022-05-29 MED ORDER — OSELTAMIVIR PHOSPHATE 75 MG PO CAPS
75.0000 mg | ORAL_CAPSULE | Freq: Two times a day (BID) | ORAL | Status: DC
  Administered 2022-05-30 – 2022-06-02 (×7): 75 mg via ORAL
  Filled 2022-05-29 (×9): qty 1

## 2022-05-29 MED ORDER — POTASSIUM CHLORIDE CRYS ER 20 MEQ PO TBCR
40.0000 meq | EXTENDED_RELEASE_TABLET | Freq: Once | ORAL | Status: AC
  Administered 2022-05-29: 40 meq via ORAL
  Filled 2022-05-29: qty 2

## 2022-05-29 MED ORDER — OSELTAMIVIR PHOSPHATE 75 MG PO CAPS
75.0000 mg | ORAL_CAPSULE | Freq: Once | ORAL | Status: AC
  Administered 2022-05-29: 75 mg via ORAL
  Filled 2022-05-29: qty 1

## 2022-05-29 NOTE — ED Provider Triage Note (Signed)
Emergency Medicine Provider Triage Evaluation Note  Mike Watkins , a 56 y.o. male  was evaluated in triage.  Pt complains of shortness of breath, productive cough, chest congestion for several days, denies fever, chills but was febrile at triage, with oxygen desaturation around 88%.  Patient placed on 2 L nasal cannula and instructed to go to the emergency department for possible sepsis workup, possible pneumonia.  No COVID, flu testing performed at urgent care or at home.  Review of Systems  Positive: Chest congestion, cough, shortness of breath, chills Negative: Chest pain, nausea, vomiting  Physical Exam  There were no vitals taken for this visit. Gen:   Awake, no distress   Resp:  Biphasic wheezing, crackles, increased respiratory effort MSK:   Moves extremities without difficulty  Other:  No tenderness to palpation of chest wall, some tachycardia noted  Medical Decision Making  Medically screening exam initiated at 7:43 PM.  Appropriate orders placed.  Heriberto Antigua was informed that the remainder of the evaluation will be completed by another provider, this initial triage assessment does not replace that evaluation, and the importance of remaining in the ED until their evaluation is complete.  Workup initiated, patient placed on nasal cannula in triage due to oxygen saturation, made to level 2, patient will need room sooner than later   Olene Floss, PA-C 05/29/22 1945

## 2022-05-29 NOTE — ED Notes (Signed)
Patient is being discharged from the Urgent Care and sent to the Emergency Department via private vehicle . Per R.Rising PA, patient is in need of higher level of care due to SOB. Patient is aware and verbalizes understanding of plan of care.  Vitals:   05/29/22 1816  BP: (!) 183/95  Pulse: (!) 122  Resp: (!) 40  Temp: (!) 100.5 F (38.1 C)  SpO2: (!) 87%

## 2022-05-29 NOTE — ED Provider Notes (Signed)
[facility] EMERGENCY DEPARTMENT Provider Note   CSN: [id number] Arrival date & time: 05/29/22  1843     History  Chief Complaint  Patient presents with   Shortness of Breath    Mike Watkins is a 56 y.o. male previous history of COVID here presenting with shortness of breath and cough.  Patient has been sick for the last week or so.  Patient went to urgent care 3 days ago and was prescribed prednisone and azithromycin and cough medicine.  Patient went back to urgent care today because he is having worsening shortness of breath.  He was noted to be hypoxic with oxygen saturation 88%.  Patient was sent here for evaluation.  Patient has no history of COPD.  Patient was a former smoker.  The history is provided by the patient.       Home Medications Prior to Admission medications   Medication Sig Start Date End Date Taking? Authorizing Provider  acetaminophen (TYLENOL) 500 MG tablet Take 1,000 mg by mouth every 6 (six) hours as needed for moderate pain.    [provider]  albuterol (VENTOLIN HFA) 108 (90 Base) MCG/ACT inhaler Inhale 1-2 puffs into the lungs every 6 (six) hours as needed for wheezing or shortness of breath. 04/22/20   Wallis Bamberg, PA-C  azithromycin (ZITHROMAX) 250 MG tablet Take 1 tablet (250 mg total) by mouth daily. Take first 2 tablets together, then 1 every day until finished. 05/26/22   Valinda Hoar, NP  famotidine (PEPCID) 40 MG tablet Take 1 tablet (40 mg total) by mouth daily for 10 days. 05/09/21 05/19/21  Theadora Rama Scales, PA-C  guaiFENesin (MUCINEX) 600 MG 12 hr tablet Take 1 tablet (600 mg total) by mouth 2 (two) times daily. 02/24/21   Valinda Hoar, NP  HYDROcodone-acetaminophen (NORCO) 7.5-325 MG tablet Take 1-2 tablets by mouth 2 (two) times daily as needed. 05/05/22   [provider]  ibuprofen (ADVIL) 800 MG tablet Take 1 tablet (800 mg total) by mouth 3 (three) times daily. 05/09/21   Theadora Rama  Scales, PA-C  Miconazole Nitrate 2 % POWD Apply to affected area twice daily for 7 days 03/23/21   Merrilee Jansky, MD  morphine (MSIR) 15 MG tablet Take 1 tablet (15 mg total) by mouth every 4 (four) hours as needed for severe pain. 08/10/18   Melene Plan, DO  potassium chloride 20 MEQ TBCR Take 20 mEq by mouth daily. 11/01/21   Raspet, Noberto Retort, PA-C  predniSONE (DELTASONE) 20 MG tablet Take 2 tablets (40 mg total) by mouth daily. 05/26/22   White, Elita Boone, NP  promethazine-dextromethorphan (PROMETHAZINE-DM) 6.25-15 MG/5ML syrup Take 5 mLs by mouth 4 (four) times daily as needed for cough. 05/26/22   White, Elita Boone, NP  traMADol (ULTRAM) 50 MG tablet Take 1 tablet (50 mg total) by mouth at bedtime as needed. 11/14/18   Eldred Manges, MD      Allergies    Penicillins and Sulfa antibiotics    Review of Systems   Review of Systems  Respiratory:  Positive for shortness of breath.   All other systems reviewed and are negative.   Physical Exam Updated Vital Signs BP (!) 155/86   Pulse (!) 127   Temp 98.4 F (36.9 C) (Oral)   Resp (!) 22   SpO2 96%  Physical Exam Vitals and nursing note reviewed.  Constitutional:      Comments: Tachypneic  HENT:     Head: Normocephalic.  Eyes:     Extraocular Movements: Extraocular movements intact.     Pupils: Pupils are equal, round, and reactive to light.  Cardiovascular:     Rate and Rhythm: Regular rhythm. Tachycardia present.  Pulmonary:     Comments: Mild diffuse wheezing Abdominal:     General: Bowel sounds are normal.     Palpations: Abdomen is soft.  Musculoskeletal:        General: Normal range of motion.     Cervical back: Normal range of motion and neck supple.  Skin:    General: Skin is warm.     Capillary Refill: Capillary refill takes less than 2 seconds.  Neurological:     General: No focal deficit present.     Mental Status: He is oriented to person, place, and time.  Psychiatric:        Mood and Affect: Mood normal.         Behavior: Behavior normal.     ED Results / Procedures / Treatments   Labs (all labs ordered are listed, but only abnormal results are displayed) Labs Reviewed  RESP PANEL BY RT-PCR (RSV, FLU A&B, COVID)  RVPGX2 - Abnormal; Notable for the following components:      Result Value   Influenza A by PCR POSITIVE (*)    All other components within normal limits  BASIC METABOLIC PANEL - Abnormal; Notable for the following components:   Potassium 3.3 (*)    Glucose, Bld 127 (*)    BUN 21 (*)    Calcium 8.8 (*)    All other components within normal limits  CULTURE, BLOOD (ROUTINE X 2)  CULTURE, BLOOD (ROUTINE X 2)  CBC  LACTIC ACID, PLASMA  LACTIC ACID, PLASMA    EKG EKG Interpretation  Date/Time:  Saturday May 29 2022 19:46:23 EST Ventricular Rate:  117 PR Interval:  122 QRS Duration: 80 QT Interval:  328 QTC Calculation: 457 R Axis:   21 Text Interpretation: Sinus tachycardia with occasional Premature ventricular complexes Otherwise normal ECG When compared with ECG of 02-May-2015 07:52, PREVIOUS ECG IS PRESENT Confirmed by Richardean Canal [phone]) on 05/29/2022 9:09:15 PM  Radiology DG Chest Port 1 View  Result Date: 05/29/2022 CLINICAL DATA:  Fever and cough EXAM: PORTABLE CHEST 1 VIEW COMPARISON:  Radiographs 04/22/2020 FINDINGS: Bilateral airspace opacities in the lower lungs. Question small bilateral pleural effusions. No pneumothorax. Stable cardiomediastinal silhouette. No acute osseous abnormality. Cervical spine fusion hardware. IMPRESSION: Bilateral lower lung airspace opacities suspicious for pneumonia. Electronically Signed   By: Minerva Fester M.D.   On: 05/29/2022 21:31    Procedures Procedures     CRITICAL CARE Performed by: Richardean Canal   Total critical care time: 30 minutes  Critical care time was exclusive of separately billable procedures and treating other patients.  Critical care was necessary to treat or prevent imminent or  life-threatening deterioration.  Critical care was time spent personally by me on the following activities: development of treatment plan with patient and/or surrogate as well as nursing, discussions with consultants, evaluation of patient's response to treatment, examination of patient, obtaining history from patient or surrogate, ordering and performing treatments and interventions, ordering and review of laboratory studies, ordering and review of radiographic studies, pulse oximetry and re-evaluation of patient's condition.  Medications Ordered in ED Medications  sodium chloride 0.9 % bolus 1,000 mL (1,000 mLs Intravenous New Bag/Given 05/29/22 2202)  albuterol (PROVENTIL) (2.5 MG/3ML) 0.083% nebulizer solution 5 mg (5 mg Nebulization Given 05/29/22 2159)  ipratropium (ATROVENT) nebulizer solution 0.5 mg (0.5 mg Nebulization Given 05/29/22 2159)  oseltamivir (TAMIFLU) capsule 75 mg (75 mg Oral Given 05/29/22 2158)    ED Course/ Medical Decision Making/ A&P                           Medical Decision Making LORCAN SHELP is a 56 y.o. male here presenting with cough and wheezing.  Patient has been sick for about 5 to 6 days.  Patient is hypoxic which is new since previous.  Consider pneumonia versus bronchitis versus flu versus COVID.  Plan to get CBC and BMP and chest x-ray and COVID and flu and RSV test.  10:34 PM Patient is flu a positive.  White blood cell count is 10.  Patient required 2 L nasal cannula.  I will hold antibiotics since he is flu a positive.  Hospitalist to admit for flu A with hypoxia.   Problems Addressed: Hypoxia: acute illness or injury Influenza A: acute illness or injury  Amount and/or Complexity of Data Reviewed Labs: ordered. Decision-making details documented in ED Course. Radiology: ordered and independent interpretation performed. Decision-making details documented in ED Course. ECG/medicine tests: ordered and independent interpretation performed.  Decision-making details documented in ED Course.  Risk Prescription drug management. Decision regarding hospitalization.    Final Clinical Impression(s) / ED Diagnoses Final diagnoses:  None    Rx / DC Orders ED Discharge Orders     None         Charlynne Pander, MD 05/29/22 2235

## 2022-05-29 NOTE — ED Triage Notes (Signed)
Patient reports SOB , productive cough and chest congestion for several days , denies fever or chills .

## 2022-05-29 NOTE — Discharge Instructions (Signed)
D/C to ED via POV 

## 2022-05-29 NOTE — ED Notes (Signed)
O2 2 liters [place] placed on pt for sat of 87%. R. Rising PA in to evaluate patient.

## 2022-05-29 NOTE — H&P (Signed)
History and Physical    Patient: Mike Watkins [id number] DOB: 10/24/1965 DOA: 05/29/2022 DOS: the patient was seen and examined on 05/30/2022 PCP: [facility]  Patient coming from: Home  Chief Complaint:  Chief Complaint  Patient presents with   Shortness of Breath   HPI: Mike Watkins is a 56 y.o. male with medical history significant of hypertension, chronic knee pain on opioids who presents from urgent care with hypoxia.  Patient has been having a week of URI symptoms.  Has cough, fever, increasing shortness of breath. Had sick contact with ex-wife. He saw urgent care 3 days ago and was prescribed prednisone and azithromycin.  Today he presents with worsening symptoms and was noted to be febrile, tachycardic and hypoxic down to 87% on room air and was sent to ED for further evaluation.  He was ill appearing, wheezing and was requiring 2 L via nasal cannula in the ED with heart rate of 127.  No leukocytosis.  Lactate within normal limits.  Flu A PCR was positive.  Hypokalemia of 3.3.  Chest x-ray on my review with left basilar opacity concerning for pneumonia.  He was given IV fluids, DuoNeb and started on Tamiflu in the ED. Review of Systems: As mentioned in the history of present illness. All other systems reviewed and are negative. Past Medical History:  Diagnosis Date   Arthritis    Hypertension    Penile lesion    Past Surgical History:  Procedure Laterality Date   ANTERIOR CERVICAL DECOMP/DISCECTOMY FUSION  12-31-2002   C5 -- C7   CYST EXCISION     pain in r/hand-middle finger   HAND SURGERY     PENILE BIOPSY N/A 08/23/2012   Procedure: EXCISIONAL BIOPSY OF PENILE LESION;  Surgeon: Milford Cage, MD;  Location: [facility];  Service: Urology;  Laterality: N/A;  EXCISIONAL BIOPSY OF PENILE LESION     POSTERIOR FUSION CERVICAL SPINE  02-26-2005   C5 -- C7   Social History:  reports that he quit smoking about 19 years ago.  His smoking use included cigarettes. He has a 63.00 pack-year smoking history. He has quit using smokeless tobacco. He reports current alcohol use. He reports that he does not use drugs.  Allergies  Allergen Reactions   Penicillins Shortness Of Breath    Has patient had a PCN reaction causing immediate rash, facial/tongue/throat swelling, SOB or lightheadedness with hypotension: Yes Has patient had a PCN reaction causing severe rash involving mucus membranes or skin necrosis: No Has patient had a PCN reaction that required hospitalization No Has patient had a PCN reaction occurring within the last 10 years: No If all of the above answers are "NO", then may proceed with Cephalosporin use.     Sulfa Antibiotics Shortness Of Breath    "Hard to breathe."    Family History  Problem Relation Age of Onset   Hypertension Mother    Diabetes Mother    Hypertension Father    Cancer Father     Prior to Admission medications   Medication Sig Start Date End Date Taking? Authorizing Provider  acetaminophen (TYLENOL) 500 MG tablet Take 1,000 mg by mouth every 6 (six) hours as needed for moderate pain.    [provider]  albuterol (VENTOLIN HFA) 108 (90 Base) MCG/ACT inhaler Inhale 1-2 puffs into the lungs every 6 (six) hours as needed for wheezing or shortness of breath. 04/22/20   Wallis Bamberg, PA-C  azithromycin (ZITHROMAX) 250 MG tablet Take  1 tablet (250 mg total) by mouth daily. Take first 2 tablets together, then 1 every day until finished. 05/26/22   Valinda Hoar, NP  famotidine (PEPCID) 40 MG tablet Take 1 tablet (40 mg total) by mouth daily for 10 days. 05/09/21 05/19/21  Theadora Rama Scales, PA-C  guaiFENesin (MUCINEX) 600 MG 12 hr tablet Take 1 tablet (600 mg total) by mouth 2 (two) times daily. 02/24/21   Valinda Hoar, NP  HYDROcodone-acetaminophen (NORCO) 7.5-325 MG tablet Take 1-2 tablets by mouth 2 (two) times daily as needed. 05/05/22   [provider]   ibuprofen (ADVIL) 800 MG tablet Take 1 tablet (800 mg total) by mouth 3 (three) times daily. 05/09/21   Theadora Rama Scales, PA-C  Miconazole Nitrate 2 % POWD Apply to affected area twice daily for 7 days 03/23/21   Merrilee Jansky, MD  morphine (MSIR) 15 MG tablet Take 1 tablet (15 mg total) by mouth every 4 (four) hours as needed for severe pain. 08/10/18   Melene Plan, DO  potassium chloride 20 MEQ TBCR Take 20 mEq by mouth daily. 11/01/21   Raspet, Noberto Retort, PA-C  predniSONE (DELTASONE) 20 MG tablet Take 2 tablets (40 mg total) by mouth daily. 05/26/22   White, Elita Boone, NP  promethazine-dextromethorphan (PROMETHAZINE-DM) 6.25-15 MG/5ML syrup Take 5 mLs by mouth 4 (four) times daily as needed for cough. 05/26/22   White, Elita Boone, NP  traMADol (ULTRAM) 50 MG tablet Take 1 tablet (50 mg total) by mouth at bedtime as needed. 11/14/18   Eldred Manges, MD    Physical Exam: Vitals:   05/29/22 1946 05/29/22 2154 05/29/22 2230 05/29/22 2357  BP: (!) 173/85 (!) 155/86 (!) 156/82 (!) 162/71  Pulse: (!) 118 (!) 127 (!) 135 (!) 105  Resp: 20 (!) 22 (!) 22 (!) 22  Temp: 98.4 F (36.9 C)   98.5 F (36.9 C)  TempSrc: Oral   Oral  SpO2: 93% 96% 95% 92%   Constitutional: NAD, calm, comfortable, ill-appearing obese male sitting upright in bed with audible wheezing Eyes: lids and conjunctivae normal ENMT: Mucous membranes are moist.  Neck: normal, supple, Respiratory: Diffuse expiratory wheezing throughout with audible wheezing.  Increased labored respiration with exertion.  No accessory muscle use.  Cardiovascular: Regular rate and rhythm, no murmurs / rubs / gallops. No extremity edema.  Abdomen: no tenderness, Bowel sounds positive.  Musculoskeletal: no clubbing / cyanosis. No joint deformity upper and lower extremities. Good ROM, no contractures. Normal muscle tone.  Skin: no rashes, lesions, ulcers. No induration Neurologic: CN 2-12 grossly intact.  Strength 5/5 in all 4.  Psychiatric:  Normal judgment and insight. Alert and oriented x 3. Normal mood. Data Reviewed:  EKG on my review with sinus tachycardia, left axis deviation and sinus rhythm with occasional PVCs  Assessment and Plan: * Acute respiratory failure with hypoxia (HCC) -secondary to Flu A pneumonia -presented with hypoxia to 87% with CXR findings concerning for viral pneumonia -continue Tamiflu BID -duoneb scheduled -significant wheezing on exam, has former hx of tobacco use but quit 20 years ago  -check VBG since he appears dyspnea even with oxygen at rest  -goal O2 sat >92%  Obesity (BMI 30-39.9) noted  Hypokalemia -K of 3.3 on presentation.  -Give oral potassium supplement  Essential hypertension -elevated potentially due to illness. Not on antihypertensives at baseline.  Chronic pain of left knee -Continue on home hydrocodone-acetaminophen.  I have refilled his [place] substance database and he has active current  prescription from 1 provider.      Advance Care Planning:   Code Status: Full Code   Consults: none  Family Communication: none at bedside  Severity of Illness: The appropriate patient status for this patient is OBSERVATION. Observation status is judged to be reasonable and necessary in order to provide the required intensity of service to ensure the patient's safety. The patient's presenting symptoms, physical exam findings, and initial radiographic and laboratory data in the context of their medical condition is felt to place them at decreased risk for further clinical deterioration. Furthermore, it is anticipated that the patient will be medically stable for discharge from the hospital within 2 midnights of admission.   Author: Anselm Jungling, DO 05/30/2022 12:11 AM  For on call review [email or web address].

## 2022-05-29 NOTE — ED Triage Notes (Signed)
Pt states cough and Sob for the past week.  Seen here on 05/26/22 for the same. States he is not any better. Sob with exertion,wheezing.

## 2022-05-29 NOTE — ED Provider Notes (Signed)
[place]    CSN: [id number] Arrival date & time: 05/29/22  1703      History   Chief Complaint Chief Complaint  Patient presents with   Cough    HPI Mike Watkins is a 56 y.o. male.  Presents with cough, shortness of breath, wheezing  Was seen here 3 days ago for same.  Diagnosed with viral URI.  Given promethazine, prednisone, azithromycin  Symptoms have worsened since then Reports increased effort to breathe  Past Medical History:  Diagnosis Date   Arthritis    Hypertension    Penile lesion     Patient Active Problem List   Diagnosis Date Noted   Right upper extremity numbness 08/14/2018   Chronic pain of left knee 02/07/2017   Fall -multiple falls in the past year. 08/29/2016   Acute pain of right knee 08/29/2016   Contusion of right hand 07/14/2016   Ganglion of flexor tendon sheath of right middle finger 06/02/2016   Impingement syndrome of left shoulder 04/20/2016    Past Surgical History:  Procedure Laterality Date   ANTERIOR CERVICAL DECOMP/DISCECTOMY FUSION  12-31-2002   C5 -- C7   CYST EXCISION     pain in r/hand-middle finger   HAND SURGERY     PENILE BIOPSY N/A 08/23/2012   Procedure: EXCISIONAL BIOPSY OF PENILE LESION;  Surgeon: Molli Hazard, MD;  Location: [facility];  Service: Urology;  Laterality: N/A;  EXCISIONAL BIOPSY OF PENILE LESION     POSTERIOR FUSION CERVICAL SPINE  02-26-2005   C5 -- C7       Home Medications    Prior to Admission medications   Medication Sig Start Date End Date Taking? Authorizing Provider  acetaminophen (TYLENOL) 500 MG tablet Take 1,000 mg by mouth every 6 (six) hours as needed for moderate pain.    [provider]  albuterol (VENTOLIN HFA) 108 (90 Base) MCG/ACT inhaler Inhale 1-2 puffs into the lungs every 6 (six) hours as needed for wheezing or shortness of breath. 04/22/20   Jaynee Eagles, PA-C  azithromycin (ZITHROMAX) 250 MG tablet Take 1 tablet (250 mg  total) by mouth daily. Take first 2 tablets together, then 1 every day until finished. 05/26/22   Hans Eden, NP  famotidine (PEPCID) 40 MG tablet Take 1 tablet (40 mg total) by mouth daily for 10 days. 05/09/21 05/19/21  Lynden Oxford Scales, PA-C  guaiFENesin (MUCINEX) 600 MG 12 hr tablet Take 1 tablet (600 mg total) by mouth 2 (two) times daily. 02/24/21   Hans Eden, NP  HYDROcodone-acetaminophen (NORCO) 7.5-325 MG tablet Take 1-2 tablets by mouth 2 (two) times daily as needed. 05/05/22   [provider]  ibuprofen (ADVIL) 800 MG tablet Take 1 tablet (800 mg total) by mouth 3 (three) times daily. 05/09/21   Lynden Oxford Scales, PA-C  Miconazole Nitrate 2 % POWD Apply to affected area twice daily for 7 days 03/23/21   Chase Picket, MD  morphine (MSIR) 15 MG tablet Take 1 tablet (15 mg total) by mouth every 4 (four) hours as needed for severe pain. 08/10/18   Deno Etienne, DO  potassium chloride 20 MEQ TBCR Take 20 mEq by mouth daily. 11/01/21   Raspet, Derry Skill, PA-C  predniSONE (DELTASONE) 20 MG tablet Take 2 tablets (40 mg total) by mouth daily. 05/26/22   White, Leitha Schuller, NP  promethazine-dextromethorphan (PROMETHAZINE-DM) 6.25-15 MG/5ML syrup Take 5 mLs by mouth 4 (four) times daily as needed for cough.  05/26/22   White, Leitha Schuller, NP  traMADol (ULTRAM) 50 MG tablet Take 1 tablet (50 mg total) by mouth at bedtime as needed. 11/14/18   Marybelle Killings, MD    Family History Family History  Problem Relation Age of Onset   Hypertension Mother    Diabetes Mother    Hypertension Father    Cancer Father     Social History Social History   Tobacco Use   Smoking status: Former    Packs/day: 3.00    Years: 21.00    Total pack years: 63.00    Types: Cigarettes    Quit date: 08/18/2002    Years since quitting: 19.7   Smokeless tobacco: Former  Substance Use Topics   Alcohol use: Yes    Comment: RARE   Drug use: No     Allergies   Penicillins and Sulfa  antibiotics   Review of Systems Review of Systems  As per HPI  Physical Exam Triage Vital Signs ED Triage Vitals  Enc Vitals Group     BP      Pulse      Resp      Temp      Temp src      SpO2      Weight      Height      Head Circumference      Peak Flow      Pain Score      Pain Loc      Pain Edu?      Excl. in [place]?    No data found.  Updated Vital Signs BP (!) 183/95 (BP Location: Right Arm)   Pulse (!) 122   Temp (!) 100.5 F (38.1 C) (Oral)   Resp (!) 40   SpO2 (!) 87%    Physical Exam Vitals and nursing note reviewed.  Constitutional:      General: He is in acute distress.     Appearance: He is ill-appearing.  HENT:     Mouth/Throat:     Pharynx: Oropharynx is clear.  Eyes:     Conjunctiva/sclera: Conjunctivae normal.  Cardiovascular:     Rate and Rhythm: Regular rhythm. Tachycardia present.  Pulmonary:     Effort: Tachypnea and respiratory distress present.     Breath sounds: Wheezing present.  Neurological:     Mental Status: He is alert and oriented to person, place, and time.     UC Treatments / Results  Labs (all labs ordered are listed, but only abnormal results are displayed) Labs Reviewed - No data to display  EKG   Radiology No results found.  Procedures Procedures (including critical care time)  Medications Ordered in UC Medications  acetaminophen (TYLENOL) tablet 650 mg (650 mg Oral Given 05/29/22 1833)    Initial Impression / Assessment and Plan / UC Course  I have reviewed the triage vital signs and the nursing notes.  Pertinent labs & imaging results that were available during my care of the patient were reviewed by me and considered in my medical decision making (see chart for details).  On arrival temp 100.5 HR 122 Resp rate 40 with labored breathing Oxygen 87% on room air  Oxygen via [place] raised O2 sat to 90-92% Tylenol dose given.  Acute respiratory failure with hypoxia Concern for sepsis likely due to  underlying pneumonia Patient requiring higher level of care than urgent care can offer. Likely needs IV abx and possible admission. Wife is transporting him across the parking  lot to the ED.  Final Clinical Impressions(s) / UC Diagnoses   Final diagnoses:  Sepsis with acute hypoxic respiratory failure, due to unspecified organism, unspecified whether septic shock present [facility])     Discharge Instructions      D/C to ED via POV     ED Prescriptions   None    PDMP not reviewed this encounter.   Marlow Baars, [place] 05/29/22 1846

## 2022-05-29 NOTE — ED Notes (Signed)
Pt called wife to take him to the ED.

## 2022-05-30 ENCOUNTER — Inpatient Hospital Stay (HOSPITAL_COMMUNITY): Payer: BC Managed Care – PPO

## 2022-05-30 ENCOUNTER — Observation Stay (HOSPITAL_COMMUNITY): Payer: BC Managed Care – PPO

## 2022-05-30 DIAGNOSIS — E669 Obesity, unspecified: Secondary | ICD-10-CM

## 2022-05-30 DIAGNOSIS — Z8616 Personal history of COVID-19: Secondary | ICD-10-CM | POA: Diagnosis not present

## 2022-05-30 DIAGNOSIS — G8929 Other chronic pain: Secondary | ICD-10-CM | POA: Diagnosis present

## 2022-05-30 DIAGNOSIS — J1000 Influenza due to other identified influenza virus with unspecified type of pneumonia: Secondary | ICD-10-CM | POA: Diagnosis present

## 2022-05-30 DIAGNOSIS — E876 Hypokalemia: Secondary | ICD-10-CM

## 2022-05-30 DIAGNOSIS — Z79899 Other long term (current) drug therapy: Secondary | ICD-10-CM | POA: Diagnosis not present

## 2022-05-30 DIAGNOSIS — Z833 Family history of diabetes mellitus: Secondary | ICD-10-CM | POA: Diagnosis not present

## 2022-05-30 DIAGNOSIS — Z981 Arthrodesis status: Secondary | ICD-10-CM | POA: Diagnosis not present

## 2022-05-30 DIAGNOSIS — Z880 Allergy status to penicillin: Secondary | ICD-10-CM | POA: Diagnosis not present

## 2022-05-30 DIAGNOSIS — Z20822 Contact with and (suspected) exposure to covid-19: Secondary | ICD-10-CM | POA: Diagnosis present

## 2022-05-30 DIAGNOSIS — J9601 Acute respiratory failure with hypoxia: Secondary | ICD-10-CM | POA: Diagnosis present

## 2022-05-30 DIAGNOSIS — Z8249 Family history of ischemic heart disease and other diseases of the circulatory system: Secondary | ICD-10-CM | POA: Diagnosis not present

## 2022-05-30 DIAGNOSIS — I10 Essential (primary) hypertension: Secondary | ICD-10-CM | POA: Diagnosis present

## 2022-05-30 DIAGNOSIS — M25562 Pain in left knee: Secondary | ICD-10-CM | POA: Diagnosis present

## 2022-05-30 DIAGNOSIS — Z882 Allergy status to sulfonamides status: Secondary | ICD-10-CM | POA: Diagnosis not present

## 2022-05-30 DIAGNOSIS — R062 Wheezing: Secondary | ICD-10-CM | POA: Diagnosis present

## 2022-05-30 DIAGNOSIS — I5031 Acute diastolic (congestive) heart failure: Secondary | ICD-10-CM

## 2022-05-30 DIAGNOSIS — Z809 Family history of malignant neoplasm, unspecified: Secondary | ICD-10-CM | POA: Diagnosis not present

## 2022-05-30 DIAGNOSIS — Z6841 Body Mass Index (BMI) 40.0 and over, adult: Secondary | ICD-10-CM | POA: Diagnosis not present

## 2022-05-30 DIAGNOSIS — Z87891 Personal history of nicotine dependence: Secondary | ICD-10-CM | POA: Diagnosis not present

## 2022-05-30 LAB — I-STAT VENOUS BLOOD GAS, ED
Acid-Base Excess: 5 mmol/L — ABNORMAL HIGH (ref 0.0–2.0)
Bicarbonate: 32.3 mmol/L — ABNORMAL HIGH (ref 20.0–28.0)
Calcium, Ion: 1.13 mmol/L — ABNORMAL LOW (ref 1.15–1.40)
HCT: 40 % (ref 39.0–52.0)
Hemoglobin: 13.6 g/dL (ref 13.0–17.0)
O2 Saturation: 56 %
Potassium: 3 mmol/L — ABNORMAL LOW (ref 3.5–5.1)
Sodium: 140 mmol/L (ref 135–145)
TCO2: 34 mmol/L — ABNORMAL HIGH (ref 22–32)
pCO2, Ven: 57.6 mmHg (ref 44–60)
pH, Ven: 7.357 (ref 7.25–7.43)
pO2, Ven: 32 mmHg (ref 32–45)

## 2022-05-30 LAB — BASIC METABOLIC PANEL
Anion gap: 7 (ref 5–15)
BUN: 18 mg/dL (ref 6–20)
CO2: 30 mmol/L (ref 22–32)
Calcium: 8.2 mg/dL — ABNORMAL LOW (ref 8.9–10.3)
Chloride: 101 mmol/L (ref 98–111)
Creatinine, Ser: 1.09 mg/dL (ref 0.61–1.24)
GFR, Estimated: >60 mL/min (ref >60)
Glucose, Bld: 150 mg/dL — ABNORMAL HIGH (ref 70–99)
Potassium: 3 mmol/L — ABNORMAL LOW (ref 3.5–5.1)
Sodium: 138 mmol/L (ref 135–145)

## 2022-05-30 LAB — PROCALCITONIN: Procalcitonin: <0.1 ng/mL

## 2022-05-30 LAB — ECHOCARDIOGRAM COMPLETE
S' Lateral: 3.7 cm
Weight: 4800 oz

## 2022-05-30 LAB — BLOOD GAS, ARTERIAL
Acid-Base Excess: 7.7 mmol/L — ABNORMAL HIGH (ref 0.0–2.0)
Bicarbonate: 34.9 mmol/L — ABNORMAL HIGH (ref 20.0–28.0)
Drawn by: 164
O2 Saturation: 96.4 %
Patient temperature: 37
pCO2 arterial: 59 mmHg — ABNORMAL HIGH (ref 32–48)
pH, Arterial: 7.38 (ref 7.35–7.45)
pO2, Arterial: 82 mmHg — ABNORMAL LOW (ref 83–108)

## 2022-05-30 LAB — MAGNESIUM: Magnesium: 1.7 mg/dL (ref 1.7–2.4)

## 2022-05-30 LAB — LACTIC ACID, PLASMA: Lactic Acid, Venous: 1.4 mmol/L (ref 0.5–1.9)

## 2022-05-30 LAB — MRSA NEXT GEN BY PCR, NASAL: MRSA by PCR Next Gen: DETECTED — AB

## 2022-05-30 LAB — BRAIN NATRIURETIC PEPTIDE: B Natriuretic Peptide: 22.6 pg/mL (ref 0.0–100.0)

## 2022-05-30 LAB — C-REACTIVE PROTEIN: CRP: 9.8 mg/dL — ABNORMAL HIGH (ref <1.0)

## 2022-05-30 MED ORDER — IPRATROPIUM-ALBUTEROL 0.5-2.5 (3) MG/3ML IN SOLN
3.0000 mL | RESPIRATORY_TRACT | Status: AC
  Administered 2022-05-30 (×5): 3 mL via RESPIRATORY_TRACT
  Filled 2022-05-30 (×5): qty 3

## 2022-05-30 MED ORDER — HYDROCODONE-ACETAMINOPHEN 7.5-325 MG PO TABS: 1.0000 | ORAL_TABLET | Freq: Four times a day (QID) | ORAL | Status: DC | PRN

## 2022-05-30 MED ORDER — FUROSEMIDE 10 MG/ML IJ SOLN
40.0000 mg | Freq: Once | INTRAMUSCULAR | Status: AC
  Administered 2022-05-30: 40 mg via INTRAVENOUS
  Filled 2022-05-30: qty 4

## 2022-05-30 MED ORDER — ACETAMINOPHEN 325 MG PO TABS
650.0000 mg | ORAL_TABLET | Freq: Four times a day (QID) | ORAL | Status: DC | PRN
  Administered 2022-05-30 (×3): 650 mg via ORAL
  Filled 2022-05-30 (×4): qty 2

## 2022-05-30 MED ORDER — MORPHINE SULFATE ER 30 MG PO TBCR
30.0000 mg | EXTENDED_RELEASE_TABLET | Freq: Two times a day (BID) | ORAL | Status: DC
  Administered 2022-05-30 (×2): 30 mg via ORAL
  Filled 2022-05-30 (×3): qty 1

## 2022-05-30 MED ORDER — MAGNESIUM SULFATE 2 GM/50ML IV SOLN
2.0000 g | Freq: Once | INTRAVENOUS | Status: AC
  Administered 2022-05-30: 2 g via INTRAVENOUS
  Filled 2022-05-30: qty 50

## 2022-05-30 MED ORDER — ALBUTEROL SULFATE (2.5 MG/3ML) 0.083% IN NEBU
2.5000 mg | INHALATION_SOLUTION | RESPIRATORY_TRACT | Status: DC | PRN
  Administered 2022-05-31 – 2022-06-01 (×3): 2.5 mg via RESPIRATORY_TRACT
  Filled 2022-05-30 (×3): qty 3

## 2022-05-30 MED ORDER — DILTIAZEM HCL 30 MG PO TABS
90.0000 mg | ORAL_TABLET | Freq: Two times a day (BID) | ORAL | Status: DC
  Administered 2022-05-30 – 2022-06-02 (×6): 90 mg via ORAL
  Filled 2022-05-30 (×3): qty 2
  Filled 2022-05-30: qty 3
  Filled 2022-05-30: qty 2
  Filled 2022-05-30: qty 3
  Filled 2022-05-30: qty 2

## 2022-05-30 MED ORDER — HYDRALAZINE HCL 20 MG/ML IJ SOLN
10.0000 mg | Freq: Four times a day (QID) | INTRAMUSCULAR | Status: DC | PRN
  Administered 2022-05-31: 10 mg via INTRAVENOUS
  Filled 2022-05-30: qty 1

## 2022-05-30 MED ORDER — AZITHROMYCIN 500 MG PO TABS
500.0000 mg | ORAL_TABLET | Freq: Every day | ORAL | Status: AC
  Administered 2022-05-30 – 2022-05-31 (×2): 500 mg via ORAL
  Filled 2022-05-30: qty 2
  Filled 2022-05-30: qty 1

## 2022-05-30 MED ORDER — HYDRALAZINE HCL 50 MG PO TABS
50.0000 mg | ORAL_TABLET | Freq: Three times a day (TID) | ORAL | Status: DC
  Administered 2022-05-30 – 2022-06-02 (×9): 50 mg via ORAL
  Filled 2022-05-30 (×9): qty 1

## 2022-05-30 MED ORDER — POTASSIUM CHLORIDE 10 MEQ/100ML IV SOLN
10.0000 meq | INTRAVENOUS | Status: AC
  Administered 2022-05-30 (×2): 10 meq via INTRAVENOUS
  Filled 2022-05-30 (×2): qty 100

## 2022-05-30 MED ORDER — AMLODIPINE BESYLATE 10 MG PO TABS
10.0000 mg | ORAL_TABLET | Freq: Every day | ORAL | Status: DC
  Administered 2022-05-30: 10 mg via ORAL
  Filled 2022-05-30: qty 2

## 2022-05-30 MED ORDER — FAMOTIDINE 20 MG PO TABS
40.0000 mg | ORAL_TABLET | Freq: Every day | ORAL | Status: DC
  Administered 2022-05-30 – 2022-06-02 (×4): 40 mg via ORAL
  Filled 2022-05-30 (×4): qty 2

## 2022-05-30 MED ORDER — METHYLPREDNISOLONE SODIUM SUCC 125 MG IJ SOLR
60.0000 mg | Freq: Every day | INTRAMUSCULAR | Status: DC
  Administered 2022-05-30: 60 mg via INTRAVENOUS
  Filled 2022-05-30: qty 2

## 2022-05-30 MED ORDER — SODIUM CHLORIDE 0.9 % IV SOLN: INTRAVENOUS | Status: DC

## 2022-05-30 MED ORDER — ENOXAPARIN SODIUM 60 MG/0.6ML IJ SOSY
60.0000 mg | PREFILLED_SYRINGE | INTRAMUSCULAR | Status: DC
  Administered 2022-05-30 – 2022-06-01 (×2): 60 mg via SUBCUTANEOUS
  Filled 2022-05-30 (×3): qty 0.6

## 2022-05-30 MED ORDER — POTASSIUM CHLORIDE CRYS ER 20 MEQ PO TBCR
40.0000 meq | EXTENDED_RELEASE_TABLET | Freq: Two times a day (BID) | ORAL | Status: AC
  Administered 2022-05-30 (×2): 40 meq via ORAL
  Filled 2022-05-30 (×2): qty 2

## 2022-05-30 NOTE — Assessment & Plan Note (Signed)
-  K of 3.3 on presentation.  -Give oral potassium supplement

## 2022-05-30 NOTE — Progress Notes (Signed)
ABG collected and sent to lab. Lab notified. 

## 2022-05-30 NOTE — Progress Notes (Signed)
ANTICOAGULATION CONSULT NOTE - Initial Consult  Pharmacy Consult for Lovenox  Indication: VTE prophylaxis  Allergies  Allergen Reactions   Penicillins Shortness Of Breath    Has patient had a PCN reaction causing immediate rash, facial/tongue/throat swelling, SOB or lightheadedness with hypotension: Yes Has patient had a PCN reaction causing severe rash involving mucus membranes or skin necrosis: No Has patient had a PCN reaction that required hospitalization No Has patient had a PCN reaction occurring within the last 10 years: No If all of the above answers are "NO", then may proceed with Cephalosporin use.     Sulfa Antibiotics Shortness Of Breath    "Hard to breathe."    Patient Measurements: Weight: 136.1 kg (300 lb) Historical Weight 300lbs per patient interview   Vital Signs: Temp: 98.3 F (36.8 C) (12/17 0828) Temp Source: Oral (12/17 0828) BP: 171/81 (12/17 0845) Pulse Rate: 44 (12/17 0945)  Labs: Recent Labs    05/29/22 2028 05/30/22 0022 05/30/22 0031  HGB 14.4  --  13.6  HCT 43.7  --  40.0  PLT 255  --   --   CREATININE 0.94 1.09  --     CrCl cannot be calculated (Unknown ideal weight.).   Medical History: Past Medical History:  Diagnosis Date   Arthritis    Hypertension    Penile lesion      Plan:  0.5mg /kg of Lovenox, rounded to nearest syringe size of 60mg .  Monitor CBC and for S/SX of bleeding.   , PharmD, BCCCP  05/30/2022,10:57 AM

## 2022-05-30 NOTE — Assessment & Plan Note (Signed)
-  Continue on home hydrocodone-acetaminophen.  I have refilled his [place] substance database and he has active current prescription from 1 provider.

## 2022-05-30 NOTE — Assessment & Plan Note (Signed)
-  elevated potentially due to illness. Not on antihypertensives at baseline.

## 2022-05-30 NOTE — Assessment & Plan Note (Signed)
noted 

## 2022-05-30 NOTE — H&P (Incomplete)
History and Physical    Patient: Mike Watkins [id number] DOB: [birth year] DOA: 05/29/2022 DOS: the patient was seen and examined on 05/29/2022 PCP: [facility]  Patient coming from: Home  Chief Complaint:  Chief Complaint  Patient presents with  . Shortness of Breath   HPI: Mike Watkins is a 56 y.o. male with medical history significant of hypertension, chronic knee pain on opioids who presents from urgent care with hypoxia.  Patient has been having a week of URI symptoms.  Has cough, fever, increasing shortness of breath.  He saw urgent care 3 days ago and was prescribed prednisone and azithromycin.  Today he presents with worsening symptoms and was noted to be febrile, tachycardic and hypoxic down to 87% on room air and was sent to ED for further evaluation.  He was ill appearing, wheezing and was requiring 2 L via nasal cannula in the ED with heart rate of 127.  No leukocytosis.  Lactate within normal limits.  Flu A PCR was positive.  Hypokalemia of 3.3.  Chest x-ray on my review with left basilar opacity concerning for pneumonia.  He was given IV fluids, DuoNeb and started on Tamiflu in the ED. Review of Systems: As mentioned in the history of present illness. All other systems reviewed and are negative. Past Medical History:  Diagnosis Date  . Arthritis   . Hypertension   . Penile lesion    Past Surgical History:  Procedure Laterality Date  . ANTERIOR CERVICAL DECOMP/DISCECTOMY FUSION  12-31-2002   C5 -- C7  . CYST EXCISION     pain in r/hand-middle finger  . HAND SURGERY    . PENILE BIOPSY N/A 08/23/2012   Procedure: EXCISIONAL BIOPSY OF PENILE LESION;  Surgeon: Milford Cage, MD;  [facility]: [facility];  Service: Urology;  Laterality: N/A;  EXCISIONAL BIOPSY OF PENILE LESION    . POSTERIOR FUSION CERVICAL SPINE  02-26-2005   C5 -- C7   Social History:  reports that he quit smoking about 19 years ago. His smoking use  included cigarettes. He has a 63.00 pack-year smoking history. He has quit using smokeless tobacco. He reports current alcohol use. He reports that he does not use drugs.  Allergies  Allergen Reactions  . Penicillins Shortness Of Breath    Has patient had a PCN reaction causing immediate rash, facial/tongue/throat swelling, SOB or lightheadedness with hypotension: Yes Has patient had a PCN reaction causing severe rash involving mucus membranes or skin necrosis: No Has patient had a PCN reaction that required hospitalization No Has patient had a PCN reaction occurring within the last 10 years: No If all of the above answers are "NO", then may proceed with Cephalosporin use.    . Sulfa Antibiotics Shortness Of Breath    "Hard to breathe."    Family History  Problem Relation Age of Onset  . Hypertension Mother   . Diabetes Mother   . Hypertension Father   . Cancer Father     Prior to Admission medications   Medication Sig Start Date End Date Taking? Authorizing Provider  acetaminophen (TYLENOL) 500 MG tablet Take 1,000 mg by mouth every 6 (six) hours as needed for moderate pain.    [provider]  albuterol (VENTOLIN HFA) 108 (90 Base) MCG/ACT inhaler Inhale 1-2 puffs into the lungs every 6 (six) hours as needed for wheezing or shortness of breath. 04/22/20   Wallis Bamberg, PA-C  azithromycin (ZITHROMAX) 250 MG tablet Take 1 tablet (250 mg  total) by mouth daily. Take first 2 tablets together, then 1 every day until finished. 05/26/22   Valinda Hoar, NP  famotidine (PEPCID) 40 MG tablet Take 1 tablet (40 mg total) by mouth daily for 10 days. 05/09/21 05/19/21  Theadora Rama Scales, PA-C  guaiFENesin (MUCINEX) 600 MG 12 hr tablet Take 1 tablet (600 mg total) by mouth 2 (two) times daily. 02/24/21   Valinda Hoar, NP  HYDROcodone-acetaminophen (NORCO) 7.5-325 MG tablet Take 1-2 tablets by mouth 2 (two) times daily as needed. 05/05/22   [provider]  ibuprofen  (ADVIL) 800 MG tablet Take 1 tablet (800 mg total) by mouth 3 (three) times daily. 05/09/21   Theadora Rama Scales, PA-C  Miconazole Nitrate 2 % POWD Apply to affected area twice daily for 7 days 03/23/21   Merrilee Jansky, MD  morphine (MSIR) 15 MG tablet Take 1 tablet (15 mg total) by mouth every 4 (four) hours as needed for severe pain. 08/10/18   Melene Plan, DO  potassium chloride 20 MEQ TBCR Take 20 mEq by mouth daily. 11/01/21   Raspet, Noberto Retort, PA-C  predniSONE (DELTASONE) 20 MG tablet Take 2 tablets (40 mg total) by mouth daily. 05/26/22   White, Elita Boone, NP  promethazine-dextromethorphan (PROMETHAZINE-DM) 6.25-15 MG/5ML syrup Take 5 mLs by mouth 4 (four) times daily as needed for cough. 05/26/22   White, Elita Boone, NP  traMADol (ULTRAM) 50 MG tablet Take 1 tablet (50 mg total) by mouth at bedtime as needed. 11/14/18   Eldred Manges, MD    Physical Exam: Vitals:   05/29/22 1946 05/29/22 2154 05/29/22 2230  BP: (!) 173/85 (!) 155/86 (!) 156/82  Pulse: (!) 118 (!) 127 (!) 135  Resp: 20 (!) 22 (!) 22  Temp: 98.4 F (36.9 C)    TempSrc: Oral    SpO2: 93% 96% 95%   *** Data Reviewed: {Tip this will not be part of the note when signed- Document your independent interpretation of telemetry tracing, EKG, lab, Radiology test or any other diagnostic tests. Add any new diagnostic test ordered today. (Optional):26781} EKG on my review with sinus tachycardia, left axis deviation and sinus rhythm with occasional PVCs  Assessment and Plan: No notes have been filed under this hospital service. Service: Hospitalist     Advance Care Planning:   Code Status: Full Code ***  Consults: ***  Family Communication: ***  Severity of Illness: {Observation/Inpatient:21159}  Author: Anselm Jungling, DO 05/29/2022 10:50 PM  For on call review [email or web address].

## 2022-05-30 NOTE — Consult Note (Signed)
NAME:  Mike Watkins, MRN:  [id number], DOB:  Jan 01, 1966, LOS: 0 ADMISSION DATE:  05/29/2022, CONSULTATION DATE: 12/17 REFERRING MD: Dr. Thedore Mins, CHIEF COMPLAINT: Shortness of breath  History of Present Illness:  56 y/o male who presented to [facility] ER on 12/16 with reports of shortness of breath, wheezing, nasal congestion, runny nose and cough.  Patient was seen at the urgent care on 12/13 where he reported a 4-day history of nasal congestion, runny nose, subjective fevers, shortness of breath, wheezing and productive cough.  Patient reported that his cough produces clear sputum.  He has noted increasing shortness of breath with exertion that improves with rest.  Wheezing that he has noted throughout the day.  Patient reports he has had a known exposure to RSV but felt as though his symptoms were different.  He was prescribed promethazine, prednisone and azithromycin.  Patient returned to the room and care on 12/16 with reports of ongoing shortness of breath that did not improve with previously prescribed regimen.  He reports his symptoms actually worsened.  He was found on presentation to have room air saturations of 87% and was placed on 2 L nasal cannula.  He was referred to the emergency room for further evaluation.  Initial ER workup notable for negative COVID screening.  However, he was influenza A positive.  Patient was admitted per [facility] for further workup.  He was supported with oxygen, Tamiflu and IV Solu-Medrol.  He was given Lasix on 12/17 with 1.5 L urinary output.  Oxygen needs down to 4.5L.  Chest x-ray concerning for worsening airspace disease.  Echocardiogram ordered and pending.   PCCM consulted for evaluation  Pertinent  Medical History  Arthritis Former smoker-3 packs/day for 21 years, 63-pack-year history.  Quit 2004 Chronic pain of the left knee Hypertension Anterior cervical discectomy, posterior cervical spine fusion  Significant Hospital Events: Including procedures,  antibiotic start and stop dates in addition to other pertinent events   12/16 Admit with SOB 12/17 PCCM consulted for pulmonary evaluation  Interim History / Subjective:  As above Pt is on 4.5 L O2 Asking to shower, wife at bedside  Objective   Blood pressure (!) 162/78, pulse 98, temperature (!) 97.5 F (36.4 C), resp. rate 17, weight 136.1 kg, SpO2 90 %.        Intake/Output Summary (Last 24 hours) at 05/30/2022 1716 Last data filed at 05/30/2022 1105 Gross per 24 hour  Intake 1195.1 ml  Output 1400 ml  Net -204.9 ml   Filed Weights   05/30/22 1000  Weight: 136.1 kg    Examination: General: obese adult male lying in bed in NAD HENT: MM pink/moist, anicteric, pupils equal, Ward O2 Lungs: prolonged exp phase, audible wheeze, lungs bilaterally with coarse rhonchi & wheezing  Cardiovascular: S1S2 RRR, no m/r/g Abdomen: protuberant, soft, bsx4 active  Extremities: warm/dry, no overt edema  Neuro: AAOx4, MAE  Resolved Hospital Problem list     Assessment & Plan:   Acute Hypoxic Respiratory Failure secondary to Influenza A Suspected Sleep Disordered Breathing   Former Tobacco Abuse, Possible COPD  -continue azithromycin -Duoneb Q4 +PRN Albuterol  -continue solumedrol 60 mg IV QD -D1/5 Tamiflu -pulmonary hygiene -IS/flutter valve, mobilize -wean O2 for sats >90% -follow intermittent CXR  -consider outpatient sleep evaluation   Best Practice (right click and "Reselect all SmartList Selections" daily)  Per [facility]  Labs   CBC: Recent Labs  Lab 05/29/22 2028 05/30/22 0031  WBC 10.1  --   HGB 14.4 13.6  HCT 43.7 40.0  MCV 92.8  --   PLT 255  --     Basic Metabolic Panel: Recent Labs  Lab 05/29/22 2028 05/30/22 0022 05/30/22 0031 05/30/22 0400  NA 139 138 140  --   K 3.3* 3.0* 3.0*  --   CL 101 101  --   --   CO2 29 30  --   --   GLUCOSE 127* 150*  --   --   BUN 21* 18  --   --   CREATININE 0.94 1.09  --   --   CALCIUM 8.8* 8.2*  --   --   MG  --    --   --  1.7   GFR: CrCl cannot be calculated (Unknown ideal weight.). Recent Labs  Lab 05/29/22 2028 05/30/22 0400 05/30/22 0425  PROCALCITON  --  <0.10  --   WBC 10.1  --   --   LATICACIDVEN 1.2  --  1.4    Liver Function Tests: No results for input(s): "AST", "ALT", "ALKPHOS", "BILITOT", "PROT", "ALBUMIN" in the last 168 hours. No results for input(s): "LIPASE", "AMYLASE" in the last 168 hours. No results for input(s): "AMMONIA" in the last 168 hours.  ABG    Component Value Date/Time   HCO3 32.3 (H) 05/30/2022 0031   TCO2 34 (H) 05/30/2022 0031   O2SAT 56 05/30/2022 0031     Coagulation Profile: No results for input(s): "INR", "PROTIME" in the last 168 hours.  Cardiac Enzymes: No results for input(s): "CKTOTAL", "CKMB", "CKMBINDEX", "TROPONINI" in the last 168 hours.  HbA1C: No results found for: "HGBA1C"  CBG: No results for input(s): "GLUCAP" in the last 168 hours.  Review of Systems: Positives in [place]  Gen: Denies fever, chills, weight change, fatigue, night sweats HEENT: Denies blurred vision, double vision, hearing loss, tinnitus, sinus congestion, rhinorrhea, sore throat, neck stiffness, dysphagia PULM: Denies shortness of breath, cough, sputum production, hemoptysis, wheezing CV: Denies chest pain, edema, orthopnea, paroxysmal nocturnal dyspnea, palpitations GI: Denies abdominal pain, nausea, vomiting, diarrhea, hematochezia, melena, constipation, change in bowel habits GU: Denies dysuria, hematuria, polyuria, oliguria, urethral discharge Endocrine: Denies hot or cold intolerance, polyuria, polyphagia or appetite change Derm: Denies rash, dry skin, scaling or peeling skin change Heme: Denies easy bruising, bleeding, bleeding gums Neuro: Denies headache, numbness, weakness, slurred speech, loss of memory or consciousness  Past Medical History:  He,  has a past medical history of Arthritis, Hypertension, and Penile lesion.   Surgical History:   Past  Surgical History:  Procedure Laterality Date   ANTERIOR CERVICAL DECOMP/DISCECTOMY FUSION  12-31-2002   C5 -- C7   CYST EXCISION     pain in r/hand-middle finger   HAND SURGERY     PENILE BIOPSY N/A 08/23/2012   Procedure: EXCISIONAL BIOPSY OF PENILE LESION;  Surgeon: Milford Cage, MD;  Location: [facility];  Service: Urology;  Laterality: N/A;  EXCISIONAL BIOPSY OF PENILE LESION     POSTERIOR FUSION CERVICAL SPINE  02-26-2005   C5 -- C7     Social History:   reports that he quit smoking about 19 years ago. His smoking use included cigarettes. He has a 63.00 pack-year smoking history. He has quit using smokeless tobacco. He reports current alcohol use. He reports that he does not use drugs.   Family History:  His family history includes Cancer in his father; Diabetes in his mother; Hypertension in his father and mother.   Allergies Allergies  Allergen Reactions  Penicillins Shortness Of Breath    Has patient had a PCN reaction causing immediate rash, facial/tongue/throat swelling, SOB or lightheadedness with hypotension: Yes Has patient had a PCN reaction causing severe rash involving mucus membranes or skin necrosis: No Has patient had a PCN reaction that required hospitalization No Has patient had a PCN reaction occurring within the last 10 years: No If all of the above answers are "NO", then may proceed with Cephalosporin use.     Sulfa Antibiotics Shortness Of Breath    "Hard to breathe."     Home Medications  Prior to Admission medications   Medication Sig Start Date End Date Taking? Authorizing Provider  acetaminophen (TYLENOL) 500 MG tablet Take 1,000 mg by mouth every 6 (six) hours as needed for moderate pain.    [provider]  albuterol (VENTOLIN HFA) 108 (90 Base) MCG/ACT inhaler Inhale 1-2 puffs into the lungs every 6 (six) hours as needed for wheezing or shortness of breath. 04/22/20   Wallis Bamberg, PA-C  azithromycin (ZITHROMAX)  250 MG tablet Take 1 tablet (250 mg total) by mouth daily. Take first 2 tablets together, then 1 every day until finished. 05/26/22   Valinda Hoar, NP  famotidine (PEPCID) 40 MG tablet Take 1 tablet (40 mg total) by mouth daily for 10 days. 05/09/21 05/19/21  Theadora Rama Scales, PA-C  guaiFENesin (MUCINEX) 600 MG 12 hr tablet Take 1 tablet (600 mg total) by mouth 2 (two) times daily. 02/24/21   Valinda Hoar, NP  HYDROcodone-acetaminophen (NORCO) 7.5-325 MG tablet Take 1-2 tablets by mouth 2 (two) times daily as needed. 05/05/22   [provider]  ibuprofen (ADVIL) 800 MG tablet Take 1 tablet (800 mg total) by mouth 3 (three) times daily. 05/09/21   Theadora Rama Scales, PA-C  Miconazole Nitrate 2 % POWD Apply to affected area twice daily for 7 days 03/23/21   Merrilee Jansky, MD  morphine (MSIR) 15 MG tablet Take 1 tablet (15 mg total) by mouth every 4 (four) hours as needed for severe pain. 08/10/18   Melene Plan, DO  potassium chloride 20 MEQ TBCR Take 20 mEq by mouth daily. 11/01/21   Raspet, Noberto Retort, PA-C  predniSONE (DELTASONE) 20 MG tablet Take 2 tablets (40 mg total) by mouth daily. 05/26/22   White, Elita Boone, NP  promethazine-dextromethorphan (PROMETHAZINE-DM) 6.25-15 MG/5ML syrup Take 5 mLs by mouth 4 (four) times daily as needed for cough. 05/26/22   White, Elita Boone, NP  traMADol (ULTRAM) 50 MG tablet Take 1 tablet (50 mg total) by mouth at bedtime as needed. 11/14/18   Eldred Manges, MD     Critical care time: n/a     Canary Brim, MSN, APRN, NP-C, AGACNP-BC [facility] 05/30/2022, 5:16 PM   Please see [email or web address] for pager details.   From 7A-7P if no response, please call [phone] After hours, please call ELink [phone]

## 2022-05-30 NOTE — ED Notes (Signed)
ED TO INPATIENT HANDOFF REPORT   S Name/Age/Gender Mike Watkins 56 y.o. male Room/Bed: 003C/003C  Code Status   Code Status: Full Code  Home/SNF/Other Home Patient oriented to: self, place, time, and situation Is this baseline? Yes   Triage Complete: Triage complete  Chief Complaint Acute respiratory failure with hypoxia (HCC) [J96.01]  Triage Note Patient reports SOB , productive cough and chest congestion for several days , denies fever or chills .    Allergies Allergies  Allergen Reactions   Penicillins Shortness Of Breath    Has patient had a PCN reaction causing immediate rash, facial/tongue/throat swelling, SOB or lightheadedness with hypotension: Yes Has patient had a PCN reaction causing severe rash involving mucus membranes or skin necrosis: No Has patient had a PCN reaction that required hospitalization No Has patient had a PCN reaction occurring within the last 10 years: No If all of the above answers are "NO", then may proceed with Cephalosporin use.     Sulfa Antibiotics Shortness Of Breath    "Hard to breathe."    Level of Care/Admitting Diagnosis ED Disposition     ED Disposition  Admit   Condition  --   Comment  Hospital [facility] [[id number]]  Level of Care: Telemetry Medical [[id number]]  May admit patient to [facility] or [facility] if equivalent level of care is available:: No  Covid Evaluation: Asymptomatic - no recent exposure (last 10 days) testing not required  Diagnosis: Acute respiratory failure with hypoxia [facility]) [[id number]]  Admitting Physician: Anselm Jungling [[id number]]  Attending Physician: Leroy Sea [phone]  Certification:: I certify this patient will need inpatient services for at least 2 midnights  Estimated Length of Stay: 2          B Medical/Surgery History Past Medical History:  Diagnosis Date   Arthritis    Hypertension    Penile lesion    Past Surgical History:  Procedure Laterality Date    ANTERIOR CERVICAL DECOMP/DISCECTOMY FUSION  12-31-2002   C5 -- C7   CYST EXCISION     pain in r/hand-middle finger   HAND SURGERY     PENILE BIOPSY N/A 08/23/2012   Procedure: EXCISIONAL BIOPSY OF PENILE LESION;  Surgeon: Milford Cage, MD;  Location: [facility];  Service: Urology;  Laterality: N/A;  EXCISIONAL BIOPSY OF PENILE LESION     POSTERIOR FUSION CERVICAL SPINE  02-26-2005   C5 -- C7     A IV Location/Drains/Wounds Patient Lines/Drains/Airways Status     Active Line/Drains/Airways     Name Placement date Placement time Site Days   Peripheral IV 05/29/22 20 G Left Antecubital 05/29/22  2202  Antecubital  1            Intake/Output Last 24 hours  Intake/Output Summary (Last 24 hours) at 05/30/2022 1116 Last data filed at 05/30/2022 1105 Gross per 24 hour  Intake 1195.1 ml  Output 1400 ml  Net -204.9 ml    Labs/Imaging Results for orders placed or performed during the hospital encounter of 05/29/22 (from the past 48 hour(s))  Resp panel by RT-PCR (RSV, Flu A&B, Covid) Anterior Nasal Swab     Status: Abnormal   Collection Time: 05/29/22  7:45 PM   Specimen: Anterior Nasal Swab  Result Value Ref Range   SARS Coronavirus 2 by RT PCR NEGATIVE NEGATIVE    Comment: (NOTE) SARS-CoV-2 target nucleic acids are NOT DETECTED.  The SARS-CoV-2 RNA is generally detectable in upper respiratory  specimens during the acute phase of infection. The lowest concentration of SARS-CoV-2 viral copies this assay can detect is 138 copies/mL. A negative result does not preclude SARS-Cov-2 infection and should not be used as the sole basis for treatment or other patient management decisions. A negative result may occur with  improper specimen collection/handling, submission of specimen other than nasopharyngeal swab, presence of viral mutation(s) within the areas targeted by this assay, and inadequate number of viral copies(<138 copies/mL). A negative  result must be combined with clinical observations, patient history, and epidemiological information. The expected result is Negative.  Fact Sheet for Patients:  BloggerCourse.comhttps://www.fda.gov/media/152166/download  Fact Sheet for Healthcare Providers:  SeriousBroker.ithttps://www.fda.gov/media/152162/download  This test is no t yet approved or cleared by the [place] FDA and  has been authorized for detection and/or diagnosis of SARS-CoV-2 by FDA under an Emergency Use Authorization (EUA). This EUA will remain  in effect (meaning this test can be used) for the duration of the COVID-19 declaration under Section 564(b)(1) of the Act, 21 U.S.C.section 360bbb-3(b)(1), unless the authorization is terminated  or revoked sooner.       Influenza A by PCR POSITIVE (A) NEGATIVE   Influenza B by PCR NEGATIVE NEGATIVE    Comment: (NOTE) The Xpert Xpress SARS-CoV-2/FLU/RSV plus assay is intended as an aid in the diagnosis of influenza from Nasopharyngeal swab specimens and should not be used as a sole basis for treatment. Nasal washings and aspirates are unacceptable for Xpert Xpress SARS-CoV-2/FLU/RSV testing.  Fact Sheet for Patients: BloggerCourse.comhttps://www.fda.gov/media/152166/download  Fact Sheet for Healthcare Providers: SeriousBroker.ithttps://www.fda.gov/media/152162/download  This test is not yet approved or cleared by the [place] FDA and has been authorized for detection and/or diagnosis of SARS-CoV-2 by FDA under an Emergency Use Authorization (EUA). This EUA will remain in effect (meaning this test can be used) for the duration of the COVID-19 declaration under Section 564(b)(1) of the Act, 21 U.S.C. section 360bbb-3(b)(1), unless the authorization is terminated or revoked.     Resp Syncytial Virus by PCR NEGATIVE NEGATIVE    Comment: (NOTE) Fact Sheet for Patients: BloggerCourse.comhttps://www.fda.gov/media/152166/download  Fact Sheet for Healthcare Providers: SeriousBroker.ithttps://www.fda.gov/media/152162/download  This test is not yet  approved or cleared by the [place] FDA and has been authorized for detection and/or diagnosis of SARS-CoV-2 by FDA under an Emergency Use Authorization (EUA). This EUA will remain in effect (meaning this test can be used) for the duration of the COVID-19 declaration under Section 564(b)(1) of the Act, 21 U.S.C. section 360bbb-3(b)(1), unless the authorization is terminated or revoked.  Performed at [facility], [address] [place], [place] [address]   CBC     Status: None   Collection Time: 05/29/22  8:28 PM  Result Value Ref Range   WBC 10.1 4.0 - 10.5 K/uL   RBC 4.71 4.22 - 5.81 MIL/uL   Hemoglobin 14.4 13.0 - 17.0 g/dL   HCT 60.443.7 54.039.0 - 98.152.0 %   MCV 92.8 80.0 - 100.0 fL   MCH 30.6 26.0 - 34.0 pg   MCHC 33.0 30.0 - 36.0 g/dL   RDW 19.112.9 47.811.5 - 29.515.5 %   Platelets 255 150 - 400 K/uL   nRBC 0.0 0.0 - 0.2 %    Comment: Performed at [facility], [address] [place], [place] [address]  Basic metabolic panel     Status: Abnormal   Collection Time: 05/29/22  8:28 PM  Result Value Ref Range   Sodium 139 135 - 145 mmol/L   Potassium  3.3 (L) 3.5 - 5.1 mmol/L   Chloride 101 98 - 111 mmol/L   CO2 29 22 - 32 mmol/L   Glucose, Bld 127 (H) 70 - 99 mg/dL    Comment: Glucose reference range applies only to samples taken after fasting for at least 8 hours.   BUN 21 (H) 6 - 20 mg/dL   Creatinine, Ser 3.24 0.61 - 1.24 mg/dL   Calcium 8.8 (L) 8.9 - 10.3 mg/dL   GFR, Estimated >40 >10 mL/min    Comment: (NOTE) Calculated using the CKD-EPI Creatinine Equation (2021)    Anion gap 9 5 - 15    Comment: Performed at [facility], [address], [place], [place] [address]  Lactic acid, plasma     Status: None   Collection Time: 05/29/22  8:28 PM  Result Value Ref Range   Lactic Acid, Venous 1.2 0.5 - 1.9 mmol/L    Comment: Performed at [facility], [address] [place], [place] [address]  Blood culture (routine x 2)     Status: None (Preliminary  result)   Collection Time: 05/29/22  8:28 PM   Specimen: BLOOD  Result Value Ref Range   Specimen Description BLOOD LEFT ANTECUBITAL    Special Requests      BOTTLES DRAWN AEROBIC AND ANAEROBIC Blood Culture adequate volume   Culture      NO GROWTH < 12 HOURS Performed at [facility], [address], [place], [place] [address]    Report Status PENDING   Blood culture (routine x 2)     Status: None (Preliminary result)   Collection Time: 05/29/22  9:50 PM   Specimen: BLOOD  Result Value Ref Range   Specimen Description BLOOD LEFT ANTECUBITAL    Special Requests      BOTTLES DRAWN AEROBIC AND ANAEROBIC Blood Culture results may not be optimal due to an excessive volume of blood received in culture bottles   Culture      NO GROWTH < 12 HOURS Performed at [facility], [address], [place], [place] [address]    Report Status PENDING   Basic metabolic panel     Status: Abnormal   Collection Time: 05/30/22 12:22 AM  Result Value Ref Range   Sodium 138 135 - 145 mmol/L   Potassium 3.0 (L) 3.5 - 5.1 mmol/L   Chloride 101 98 - 111 mmol/L   CO2 30 22 - 32 mmol/L   Glucose, Bld 150 (H) 70 - 99 mg/dL    Comment: Glucose reference range applies only to samples taken after fasting for at least 8 hours.   BUN 18 6 - 20 mg/dL    Comment: RESULT REPEATED AND VERIFIED   Creatinine, Ser 1.09 0.61 - 1.24 mg/dL   Calcium 8.2 (L) 8.9 - 10.3 mg/dL   GFR, Estimated >87 >56 mL/min    Comment: (NOTE) Calculated using the CKD-EPI Creatinine Equation (2021)    Anion gap 7 5 - 15    Comment: Performed at [facility], [address], [place], [place] [address]  I-Stat venous blood gas, ED     Status: Abnormal   Collection Time: 05/30/22 12:31 AM  Result Value Ref Range   pH, Ven 7.357 7.25 - 7.43   pCO2, Ven 57.6 44 - 60 mmHg   pO2, Ven 32 32 - 45 mmHg   Bicarbonate 32.3 (H) 20.0 - 28.0 mmol/L   TCO2 34 (H) 22 - 32 mmol/L  O2 Saturation 56 %   Acid-Base Excess 5.0 (H)  0.0 - 2.0 mmol/L   Sodium 140 135 - 145 mmol/L   Potassium 3.0 (L) 3.5 - 5.1 mmol/L   Calcium, Ion 1.13 (L) 1.15 - 1.40 mmol/L   HCT 40.0 39.0 - 52.0 %   Hemoglobin 13.6 13.0 - 17.0 g/dL   Sample type VENOUS    Comment NOTIFIED PHYSICIAN   Brain natriuretic peptide     Status: None   Collection Time: 05/30/22  4:00 AM  Result Value Ref Range   B Natriuretic Peptide 22.6 0.0 - 100.0 pg/mL    Comment: Performed at [facility], [address] [place], [place] [address]  Magnesium     Status: None   Collection Time: 05/30/22  4:00 AM  Result Value Ref Range   Magnesium 1.7 1.7 - 2.4 mg/dL    Comment: Performed at [facility], [address], [place], [place] [address]  C-reactive protein     Status: Abnormal   Collection Time: 05/30/22  4:00 AM  Result Value Ref Range   CRP 9.8 (H) <1.0 mg/dL    Comment: Performed at [facility], [address], [place], [place] [address]  Lactic acid, plasma     Status: None   Collection Time: 05/30/22  4:25 AM  Result Value Ref Range   Lactic Acid, Venous 1.4 0.5 - 1.9 mmol/L    Comment: Performed at [facility], [address] [place], [place] [address]   DG Chest Port 1 View  Result Date: 05/30/2022 CLINICAL DATA:  56 year old male with shortness of breath. EXAM: PORTABLE CHEST 1 VIEW COMPARISON:  Portable chest 05/29/2022 and earlier. FINDINGS: Portable AP upright view at 0643 hours. Low lung volumes with patchy and indistinct lung base opacity greater on the left. This appears somewhat progressed from yesterday. No air bronchograms. Normal cardiac size and mediastinal contours. Visualized tracheal air column is within normal limits. No pneumothorax or pulmonary edema. No definite pleural effusion. Chronic cervical ACDF. Paucity of bowel gas in the upper abdomen. IMPRESSION: Confluent but indistinct lung base opacity, greater on the left and mildly progressed from yesterday. Favor acute bilateral lung base infection,  pneumonia. No definite pleural effusion. PA and lateral chest x-ray may be valuable when feasible. Electronically Signed   By: Odessa Fleming M.D.   On: 05/30/2022 07:03   DG Chest Port 1 View  Result Date: 05/29/2022 CLINICAL DATA:  Fever and cough EXAM: PORTABLE CHEST 1 VIEW COMPARISON:  Radiographs 04/22/2020 FINDINGS: Bilateral airspace opacities in the lower lungs. Question small bilateral pleural effusions. No pneumothorax. Stable cardiomediastinal silhouette. No acute osseous abnormality. Cervical spine fusion hardware. IMPRESSION: Bilateral lower lung airspace opacities suspicious for pneumonia. Electronically Signed   By: Minerva Fester M.D.   On: 05/29/2022 21:31    Pending Labs Unresulted Labs (From admission, onward)     Start     Ordered   05/31/22 0500  Magnesium  Daily,   R     Question:  Specimen collection method  Answer:  Lab=Lab collect   05/30/22 0614   05/31/22 0500  C-reactive protein  Daily,   R     Question:  Specimen collection method  Answer:  Lab=Lab collect   05/30/22 0614   05/31/22 0500  CBC with Differential/Platelet  Daily,   R     Question:  Specimen collection method  Answer:  Lab=Lab collect   05/30/22 0614   05/31/22 0500  Brain natriuretic peptide  Daily,   R     Question:  Specimen collection method  Answer:  Lab=Lab collect   05/30/22 0614   05/31/22 0500  Procalcitonin  Daily,   R      05/30/22 0817   05/31/22 0500  TSH  Tomorrow morning,   R        05/30/22 0903   05/30/22 0818  Procalcitonin - Baseline  ONCE - URGENT,   URGENT        05/30/22 0817            Vitals/Pain Today's Vitals   05/30/22 0915 05/30/22 0930 05/30/22 0945 05/30/22 1000  BP:      Pulse: (!) 135 (!) 130 (!) 44   Resp: (!) 24 (!) 27 (!) 26   Temp:      TempSrc:      SpO2: 91% 91% 93%   Weight:    136.1 kg  PainSc:        Isolation Precautions No active isolations  Medications Medications  oseltamivir (TAMIFLU) capsule 75 mg (75 mg Oral Given 05/30/22 1101)   ipratropium-albuterol (DUONEB) 0.5-2.5 (3) MG/3ML nebulizer solution 3 mL (3 mLs Nebulization Given 05/30/22 0821)  acetaminophen (TYLENOL) tablet 650 mg (650 mg Oral Given 05/30/22 0820)  HYDROcodone-acetaminophen (NORCO) 7.5-325 MG per tablet 1-2 tablet (has no administration in time range)  albuterol (PROVENTIL) (2.5 MG/3ML) 0.083% nebulizer solution 2.5 mg (has no administration in time range)  potassium chloride SA (KLOR-CON M) CR tablet 40 mEq (40 mEq Oral Given 05/30/22 0820)  methylPREDNISolone sodium succinate (SOLU-MEDROL) 125 mg/2 mL injection 60 mg (60 mg Intravenous Given 05/30/22 1103)  amLODipine (NORVASC) tablet 10 mg (10 mg Oral Given 05/30/22 1101)  hydrALAZINE (APRESOLINE) injection 10 mg (has no administration in time range)  azithromycin (ZITHROMAX) tablet 500 mg (500 mg Oral Given 05/30/22 1101)  famotidine (PEPCID) tablet 40 mg (40 mg Oral Given 05/30/22 1102)  morphine (MS CONTIN) 12 hr tablet 30 mg (30 mg Oral Given 05/30/22 1102)  enoxaparin (LOVENOX) injection 60 mg (has no administration in time range)  sodium chloride 0.9 % bolus 1,000 mL (0 mLs Intravenous Stopped 05/30/22 0047)  albuterol (PROVENTIL) (2.5 MG/3ML) 0.083% nebulizer solution 5 mg (5 mg Nebulization Given 05/29/22 2159)  ipratropium (ATROVENT) nebulizer solution 0.5 mg (0.5 mg Nebulization Given 05/29/22 2159)  oseltamivir (TAMIFLU) capsule 75 mg (75 mg Oral Given 05/29/22 2158)  potassium chloride SA (KLOR-CON M) CR tablet 40 mEq (40 mEq Oral Given 05/29/22 2312)  potassium chloride 10 mEq in 100 mL IVPB (0 mEq Intravenous Stopped 05/30/22 0315)  furosemide (LASIX) injection 40 mg (40 mg Intravenous Given 05/30/22 0820)    Mobility walks with device Low fall risk   Focused Assessments Pulmonary Assessment Handoff:  Lung sounds:   O2 Device: Nasal Cannula O2 Flow Rate (L/min): 3 L/min    R Recommendations: See Admitting Provider Note  Report given to:

## 2022-05-30 NOTE — Assessment & Plan Note (Signed)
-  secondary to Flu A pneumonia -presented with hypoxia to 87% with CXR findings concerning for viral pneumonia -continue Tamiflu BID -duoneb scheduled -significant wheezing on exam, has former hx of tobacco use but quit 20 years ago  -check VBG since he appears dyspnea even with oxygen at rest  -goal O2 sat >92%

## 2022-05-30 NOTE — Progress Notes (Signed)
PROGRESS NOTE                                                                                                                                                                                                             Patient Demographics:    Mike Watkins, is a 56 y.o. male, DOB - [birth year], OEU:[id number]  Outpatient Primary MD for the patient is [facility]    LOS - 0  Admit date - 05/29/2022    Chief Complaint  Patient presents with   Shortness of Breath       Brief Narrative (HPI from H&P)   56 y.o. male with medical history significant of hypertension, chronic knee pain on opioids who presents from urgent care with hypoxia.  In the ER he was diagnosed with influenza A pneumonia along with acute hypoxic respiratory failure and admitted to the hospital.   Subjective:    Mike Watkins today has, No headache, No chest pain, No abdominal pain - No Nausea, No new weakness tingling or numbness, +ve SOB and wheezing   Assessment  & Plan :     Acute respiratory failure with hypoxia (HCC) - due to influenza A pneumonia with loud wheezing and shortness of breath, definitely has evidence of bronchial constriction, will add some Solu-Medrol along with a dose of Lasix as he has some history of orthopnea and has some crackles on exam as well.  Check BNP and echocardiogram, continue combination of Tamiflu, steroids and a single dose Lasix.  Supplemental oxygen.  Encouraged to sit in chair use flutter valve and I-S for pulmonary toiletry.  Has remote history of smoking.  Question undiagnosed COPD.  4 days of azithromycin as well for underlying questionable COPD exacerbation.  Obesity (BMI 30-39.9) noted follow-up with PCP for weight loss.  Hypokalemia - replaced  Essential hypertension  - add Norvasc and as needed hydralazine.  Chronic pain of left knee  - Continue on home hydrocodone-acetaminophen, instead of MS IR  add long-acting morphine and monitor.       Condition - Extremely Guarded  Family Communication  :  None  Code Status :  Full  Consults  :  None  PUD Prophylaxis :    Procedures  :     TTE      Disposition Plan  :    Status is:  Observation  DVT Prophylaxis  :    SCDs Start: 05/29/22 2248    Lab Results  Component Value Date   PLT 255 05/29/2022    Diet :  Diet Order             Diet regular Room service appropriate? Yes; Fluid consistency: Thin  Diet effective now                    Inpatient Medications  Scheduled Meds:  furosemide  40 mg Intravenous Once   ipratropium-albuterol  3 mL Nebulization Q4H   methylPREDNISolone (SOLU-MEDROL) injection  60 mg Intravenous Q24H   oseltamivir  75 mg Oral BID   potassium chloride  40 mEq Oral BID   Continuous Infusions: PRN Meds:.acetaminophen, albuterol, HYDROcodone-acetaminophen  Antibiotics  :    Anti-infectives (From admission, onward)    Start     Dose/Rate Route Frequency Ordered Stop   05/30/22 1000  oseltamivir (TAMIFLU) capsule 75 mg        75 mg Oral 2 times daily 05/29/22 2250 06/04/22 0959   05/29/22 2115  oseltamivir (TAMIFLU) capsule 75 mg        75 mg Oral  Once 05/29/22 2112 05/29/22 2158         Objective:   Vitals:   05/30/22 0645 05/30/22 0700 05/30/22 0715 05/30/22 0730  BP: (!) 153/76 (!) 157/82 (!) 145/57 (!) 154/86  Pulse: (!) 119 (!) 114 (!) 115 (!) 125  Resp:      Temp:      TempSrc:      SpO2: 95% 95% 96% 93%    Wt Readings from Last 3 Encounters:  08/11/18 129.7 kg  06/24/18 129.7 kg  03/16/18 128.6 kg     Intake/Output Summary (Last 24 hours) at 05/30/2022 0814 Last data filed at 05/30/2022 0315 Gross per 24 hour  Intake 1195.1 ml  Output --  Net 1195.1 ml     Physical Exam  Awake Alert, No new F.N deficits, Normal affect [place].AT,PERRAL Supple Neck, No JVD,   Symmetrical Chest wall movement, Good air movement bilaterally, CTAB RRR,No  Gallops,Rubs or new Murmurs,  +ve B.Sounds, Abd Soft, No tenderness,   No Cyanosis, Clubbing or edema     RN pressure injury documentation:      Data Review:    Recent Labs  Lab 05/29/22 2028 05/30/22 0031  WBC 10.1  --   HGB 14.4 13.6  HCT 43.7 40.0  PLT 255  --   MCV 92.8  --   MCH 30.6  --   MCHC 33.0  --   RDW 12.9  --     Recent Labs  Lab 05/29/22 2028 05/30/22 0022 05/30/22 0031 05/30/22 0425  NA 139 138 140  --   K 3.3* 3.0* 3.0*  --   CL 101 101  --   --   CO2 29 30  --   --   ANIONGAP 9 7  --   --   GLUCOSE 127* 150*  --   --   BUN 21* 18  --   --   CREATININE 0.94 1.09  --   --   LATICACIDVEN 1.2  --   --  1.4  CALCIUM 8.8* 8.2*  --   --      Radiology Reports DG Chest Port 1 View  Result Date: 05/30/2022 CLINICAL DATA:  56 year old male with shortness of breath. EXAM: PORTABLE CHEST 1 VIEW COMPARISON:  Portable chest 05/29/2022 and earlier. FINDINGS:  Portable AP upright view at 0643 hours. Low lung volumes with patchy and indistinct lung base opacity greater on the left. This appears somewhat progressed from yesterday. No air bronchograms. Normal cardiac size and mediastinal contours. Visualized tracheal air column is within normal limits. No pneumothorax or pulmonary edema. No definite pleural effusion. Chronic cervical ACDF. Paucity of bowel gas in the upper abdomen. IMPRESSION: Confluent but indistinct lung base opacity, greater on the left and mildly progressed from yesterday. Favor acute bilateral lung base infection, pneumonia. No definite pleural effusion. PA and lateral chest x-ray may be valuable when feasible. Electronically Signed   By: Odessa Fleming M.D.   On: 05/30/2022 07:03   DG Chest Port 1 View  Result Date: 05/29/2022 CLINICAL DATA:  Fever and cough EXAM: PORTABLE CHEST 1 VIEW COMPARISON:  Radiographs 04/22/2020 FINDINGS: Bilateral airspace opacities in the lower lungs. Question small bilateral pleural effusions. No pneumothorax. Stable  cardiomediastinal silhouette. No acute osseous abnormality. Cervical spine fusion hardware. IMPRESSION: Bilateral lower lung airspace opacities suspicious for pneumonia. Electronically Signed   By: Minerva Fester M.D.   On: 05/29/2022 21:31      Signature  -   Susa Raring M.D on 05/30/2022 at 8:14 AM   -  To page go to [email or web address]

## 2022-05-30 NOTE — Progress Notes (Signed)
  Echocardiogram 2D Echocardiogram has been performed.  Mike Watkins 05/30/2022, 3:29 PM

## 2022-05-31 DIAGNOSIS — J9601 Acute respiratory failure with hypoxia: Secondary | ICD-10-CM | POA: Diagnosis not present

## 2022-05-31 LAB — BASIC METABOLIC PANEL
Anion gap: 9 (ref 5–15)
BUN: 18 mg/dL (ref 6–20)
CO2: 30 mmol/L (ref 22–32)
Calcium: 8.5 mg/dL — ABNORMAL LOW (ref 8.9–10.3)
Chloride: 101 mmol/L (ref 98–111)
Creatinine, Ser: 0.96 mg/dL (ref 0.61–1.24)
GFR, Estimated: >60 mL/min (ref >60)
Glucose, Bld: 146 mg/dL — ABNORMAL HIGH (ref 70–99)
Potassium: 4.7 mmol/L (ref 3.5–5.1)
Sodium: 140 mmol/L (ref 135–145)

## 2022-05-31 LAB — MAGNESIUM: Magnesium: 2.5 mg/dL — ABNORMAL HIGH (ref 1.7–2.4)

## 2022-05-31 LAB — BRAIN NATRIURETIC PEPTIDE: B Natriuretic Peptide: 35.6 pg/mL (ref 0.0–100.0)

## 2022-05-31 LAB — PROCALCITONIN: Procalcitonin: <0.1 ng/mL

## 2022-05-31 LAB — CBC WITH DIFFERENTIAL/PLATELET
Abs Immature Granulocytes: 0.06 10*3/uL (ref 0.00–0.07)
Basophils Absolute: 0 10*3/uL (ref 0.0–0.1)
Basophils Relative: 0 %
Eosinophils Absolute: 0 10*3/uL (ref 0.0–0.5)
Eosinophils Relative: 0 %
HCT: 44.9 % (ref 39.0–52.0)
Hemoglobin: 14.2 g/dL (ref 13.0–17.0)
Immature Granulocytes: 1 %
Lymphocytes Relative: 11 %
Lymphs Abs: 1 10*3/uL (ref 0.7–4.0)
MCH: 29.3 pg (ref 26.0–34.0)
MCHC: 31.6 g/dL (ref 30.0–36.0)
MCV: 92.6 fL (ref 80.0–100.0)
Monocytes Absolute: 0.7 10*3/uL (ref 0.1–1.0)
Monocytes Relative: 8 %
Neutro Abs: 7.2 10*3/uL (ref 1.7–7.7)
Neutrophils Relative %: 80 %
Platelets: 255 10*3/uL (ref 150–400)
RBC: 4.85 MIL/uL (ref 4.22–5.81)
RDW: 13.1 % (ref 11.5–15.5)
WBC: 9 10*3/uL (ref 4.0–10.5)
nRBC: 0 % (ref 0.0–0.2)

## 2022-05-31 LAB — C-REACTIVE PROTEIN: CRP: 15.3 mg/dL — ABNORMAL HIGH (ref <1.0)

## 2022-05-31 LAB — TSH: TSH: 0.566 u[IU]/mL (ref 0.350–4.500)

## 2022-05-31 MED ORDER — HYDROCODONE-ACETAMINOPHEN 7.5-325 MG PO TABS: 1.0000 | ORAL_TABLET | Freq: Four times a day (QID) | ORAL | Status: DC | PRN

## 2022-05-31 MED ORDER — PREDNISONE 20 MG PO TABS
40.0000 mg | ORAL_TABLET | Freq: Every day | ORAL | Status: DC
  Administered 2022-05-31: 40 mg via ORAL
  Filled 2022-05-31: qty 2

## 2022-05-31 NOTE — Evaluation (Signed)
Physical Therapy Evaluation and Discharge Patient Details Name: Mike Watkins MRN: [id number] DOB: 04/11/1966 Today's Date: 05/31/2022  History of Present Illness  Pt is a 56 y.o. M who presents 05/29/2022 with SOB, wheezing, nasal congestion, runny nose and cough. Pt was influenza A positive. Found on presentation to have room air saturations of 87% and placed on 2L nasal cannula. Significant PMH: former smoker, HTN, arthritis.  Clinical Impression  Patient evaluated by Physical Therapy with no further acute PT needs identified. Pt ambulating 400 ft with no assistive device independently. HR up to 136 bpm. Pt presents with decreased cardiopulmonary endurance; he reports DOE in the past 3-4 months. Education provided regarding activity recommendations and progression. All education has been completed and the patient has no further questions. No follow-up Physical Therapy or equipment needs. PT is signing off. Thank you for this referral.  SATURATION QUALIFICATIONS: (This note is used to comply with regulatory documentation for home oxygen)  Patient Saturations on Room Air at Rest = 90%  Patient Saturations on Room Air while Ambulating = 85%  Patient Saturations on 3 Liters of oxygen while Ambulating = 94%  Please briefly explain why patient needs home oxygen: To maintain oxygen saturations > 88% when ambulating      Recommendations for follow up therapy are one component of a multi-disciplinary discharge planning process, led by the attending physician.  Recommendations may be updated based on patient status, additional functional criteria and insurance authorization.  Follow Up Recommendations No PT follow up      Assistance Recommended at Discharge PRN  Patient can return home with the following  Assist for transportation;Help with stairs or ramp for entrance    Equipment Recommendations None recommended by PT  Recommendations for Other Services       Functional Status  Assessment Patient has had a recent decline in their functional status and demonstrates the ability to make significant improvements in function in a reasonable and predictable amount of time.     Precautions / Restrictions Precautions Precautions: Other (comment) Precaution Comments: watch O2 Restrictions Weight Bearing Restrictions: No      Mobility  Bed Mobility               General bed mobility comments: Sitting EOB upon arrival    Transfers Overall transfer level: Independent Equipment used: None                    Ambulation/Gait Ambulation/Gait assistance: Independent Gait Distance (Feet): 400 Feet Assistive device: None Gait Pattern/deviations: WFL(Within Functional Limits)          Stairs            Wheelchair Mobility    Modified Rankin (Stroke Patients Only)       Balance Overall balance assessment: Mild deficits observed, not formally tested                                           Pertinent Vitals/Pain Pain Assessment Pain Assessment: No/denies pain    Home Living Family/patient expects to be discharged to:: Private residence Living Arrangements: Spouse/significant other Available Help at Discharge: Family Type of Home: Mobile home Home Access: Stairs to enter   Entrance Stairs-Number of Steps: 5   Home Layout: One level        Prior Function Prior Level of Function : Independent/Modified Independent  Mobility Comments: working as a Designer, multimedia        Extremity/Trunk Assessment   Upper Extremity Assessment Upper Extremity Assessment: Overall WFL for tasks assessed    Lower Extremity Assessment Lower Extremity Assessment: Overall WFL for tasks assessed       Communication   Communication: No difficulties  Cognition Arousal/Alertness: Awake/alert Behavior During Therapy: WFL for tasks assessed/performed Overall Cognitive Status: Within  Functional Limits for tasks assessed                                          General Comments      Exercises     Assessment/Plan    PT Assessment Patient does not need any further PT services  PT Problem List         PT Treatment Interventions      PT Goals (Current goals can be found in the Care Plan section)  Acute Rehab PT Goals Patient Stated Goal: to take a shower PT Goal Formulation: All assessment and education complete, DC therapy    Frequency       Co-evaluation               AM-PAC PT "6 Clicks" Mobility  Outcome Measure Help needed turning from your back to your side while in a flat bed without using bedrails?: None Help needed moving from lying on your back to sitting on the side of a flat bed without using bedrails?: None Help needed moving to and from a bed to a chair (including a wheelchair)?: None Help needed standing up from a chair using your arms (e.g., wheelchair or bedside chair)?: None Help needed to walk in hospital room?: None Help needed climbing 3-5 steps with a railing? : A Little 6 Click Score: 23    End of Session Equipment Utilized During Treatment: Oxygen Activity Tolerance: Patient tolerated treatment well Patient left: with call bell/phone within reach;in chair Nurse Communication: Mobility status PT Visit Diagnosis: Difficulty in walking, not elsewhere classified (R26.2)    Time: 6962-9528 PT Time Calculation (min) (ACUTE ONLY): 31 min   Charges:   PT Evaluation $PT Eval Low Complexity: 1 Low PT Treatments $Therapeutic Activity: 8-22 mins        Lillia Pauls, PT, DPT Acute Rehabilitation Services Office [phone]   Mike Watkins 05/31/2022, 9:20 AM

## 2022-05-31 NOTE — Progress Notes (Addendum)
Attending addendum:  Looking better O2 needs improved Will add flutter Wean O2 for sats > 90% 5 days steroids and tamiflu When weaned to RA and feels up to it can probably go home Available PRN  Myrla Halsted MD PCCM      NAME:  Mike Watkins, MRN:  [id number], DOB:  January 14, 1966, LOS: 1 ADMISSION DATE:  05/29/2022, CONSULTATION DATE: 12/17 REFERRING MD: Dr. Thedore Mins, CHIEF COMPLAINT: Shortness of breath  History of Present Illness:  56 y/o male who presented to [facility] ER on 12/16 with reports of shortness of breath, wheezing, nasal congestion, runny nose and cough.  Patient was seen at the urgent care on 12/13 where he reported a 4-day history of nasal congestion, runny nose, subjective fevers, shortness of breath, wheezing and productive cough.  Patient reported that his cough produces clear sputum.  He has noted increasing shortness of breath with exertion that improves with rest.  Wheezing that he has noted throughout the day.  Patient reports he has had a known exposure to RSV but felt as though his symptoms were different.  He was prescribed promethazine, prednisone and azithromycin.  Patient returned to the room and care on 12/16 with reports of ongoing shortness of breath that did not improve with previously prescribed regimen.  He reports his symptoms actually worsened.  He was found on presentation to have room air saturations of 87% and was placed on 2 L nasal cannula.  He was referred to the emergency room for further evaluation.  Initial ER workup notable for negative COVID screening.  However, he was influenza A positive.  Patient was admitted per [facility] for further workup.  He was supported with oxygen, Tamiflu and IV Solu-Medrol.  He was given Lasix on 12/17 with 1.5 L urinary output.  Oxygen needs down to 4.5L.  Chest x-ray concerning for worsening airspace disease.  Echocardiogram ordered and pending.   PCCM consulted for evaluation  Pertinent  Medical History  Arthritis Former  smoker-3 packs/day for 21 years, 63-pack-year history.  Quit 2004 Chronic pain of the left knee Hypertension Anterior cervical discectomy, posterior cervical spine fusion  Significant Hospital Events: Including procedures, antibiotic start and stop dates in addition to other pertinent events   12/16 Admit with SOB 12/17 PCCM consulted for pulmonary evaluation  Interim History / Subjective:  Feeling better wants to go home  Now on 3 lpm Objective   Blood pressure (Abnormal) 152/90, pulse 95, temperature 97.8 F (36.6 C), temperature source Oral, resp. rate 16, weight 136.1 kg, SpO2 90 %.        Intake/Output Summary (Last 24 hours) at 05/31/2022 7322 Last data filed at 05/30/2022 1105 Gross per 24 hour  Intake no documentation  Output 1400 ml  Net -1400 ml   Filed Weights   05/30/22 1000  Weight: 136.1 kg    Examination: General 56 year old male sitting up in bed he is in no distress HENT NCAT no JVD Pulm diffuse exp wheeze. No accessory use. Phonating in full sentences Card rrr Abd soft Ext warm and dry  Neuro intact   Resolved Hospital Problem list     Assessment & Plan:   Acute Hypoxic Respiratory Failure secondary to Influenza A Suspected Sleep Disordered Breathing   Former Tobacco Abuse, Possible COPD   Discussion  Plan Cont azith (complete 5d) Cont BDs and PRN SABA Day 2/5 Tamiflu Cont pulm hygiene: flutter and mobilize Wean O2 Solumedrol day 2 (started 12/17)->would do 5d of pulse steroids. Will change to  pred today  Cont sleep eval as OP  Best Practice (right click and "Reselect all SmartList Selections" daily)  Per [facility]  Critical care time: n/a      Simonne Martinet ACNP-BC [facility] Pulmonary/Critical Care Pager # [phone] OR # [phone] if no answer

## 2022-05-31 NOTE — TOC Progression Note (Signed)
Transition of Care [facility]) - Progression Note    Patient Details  Name: Mike Watkins MRN: [id number] Date of Birth: [birth year]  Transition of Care [facility]) CM/SW Contact  Leone Haven, RN Phone Number: 05/31/2022, 12:22 PM  Clinical Narrative:    from home, Flu, Pna vs COPD ex, conts on 3 liters, may need home oxygen . TOC following.        Expected Discharge Plan and Services                                                 Social Determinants of Health (SDOH) Interventions    Readmission Risk Interventions     No data to display

## 2022-05-31 NOTE — Progress Notes (Signed)
Mobility Specialist Progress Note    05/31/22 1541  Mobility  Activity Ambulated independently in hallway  Level of Assistance Independent  Assistive Device None  Distance Ambulated (ft) 220 ft  Activity Response Tolerated fair  Mobility Referral Yes  $Mobility charge 1 Mobility   Pre-Mobility: 108 HR, 94% SpO2 During Mobility: 128 HR Post-Mobility: 115 HR, 93% SpO2  Pt received in chair and agreeable. No complaints on walk. Noticeably SOB w/ exertion. Required 3LO2 to maintain SpO2 >/=90%. Returned to chair with call bell in reach.    Pt stated he would not use supplemental oxygen once he went back to work.   [place] Mobility Specialist  Please Neurosurgeon or Rehab Office at [phone]

## 2022-05-31 NOTE — Progress Notes (Addendum)
PROGRESS NOTE                                                                                                                                                                                                             Patient Demographics:    Mike Watkins, is a 56 y.o. male, DOB - 11-22-1965, GNF:[id number]  Outpatient Primary MD for the patient is [facility]    LOS - 1  Admit date - 05/29/2022    Chief Complaint  Patient presents with   Shortness of Breath       Brief Narrative (HPI from H&P)   56 y.o. male with medical history significant of hypertension, chronic knee pain on opioids who presents from urgent care with hypoxia.  In the ER he was diagnosed with influenza A pneumonia along with acute hypoxic respiratory failure and admitted to the hospital.   Subjective:    Mike Watkins today has, No headache, No chest pain, No abdominal pain - No Nausea, No new weakness tingling or numbness, +ve SOB and wheezing   Assessment  & Plan :     Acute respiratory failure with hypoxia (HCC) - due to influenza A pneumonia with loud wheezing and shortness of breath, definitely has evidence of bronchial constriction, he was placed on Tamiflu, IV steroids along with a single dose Lasix on 05/30/2022.  Thankfully his breathing has improved and wheezing has resolved, echocardiogram stable, also seen by pulmonary.  Appreciate their input.  There is gradual but definite improvement on 05/31/2022.  Continue present line of treatment.    Encouraged to sit in chair use flutter valve and I-S for pulmonary toiletry.  Has remote history of smoking.  Question undiagnosed COPD.  4 days of azithromycin as well for underlying questionable COPD exacerbation.  Obesity (BMI 30-39.9) noted follow-up with PCP for weight loss.  Hypokalemia - replaced  Essential hypertension  - added Cardizem + Hydralazine.  Chronic pain of left knee   - Continue on home hydrocodone-acetaminophen, PRN Narcan       Condition - Extremely Guarded  Family Communication  :  None  Code Status :  Full  Consults  :  PCCP  PUD Prophylaxis : pepcid   Procedures  :     TTE - 1. Left ventricular ejection fraction, by estimation, is 60 to 65%. The left  ventricle has normal function. The left ventricle has no regional wall motion abnormalities. Left ventricular diastolic parameters are indeterminate.  2. Right ventricular systolic function is hyperdynamic. The right ventricular size is normal. Tricuspid regurgitation signal is inadequate for assessing PA pressure.  3. The mitral valve is normal in structure. Mild mitral valve regurgitation.  4. The aortic valve was not well visualized. Aortic valve regurgitation is not visualized. No aortic stenosis is present.  5. The inferior vena cava is normal in size with greater than 50% respiratory variability, suggesting right atrial pressure of 3 mmHg      Disposition Plan  :    Status is: Observation  DVT Prophylaxis  :  Lovenox  SCDs Start: 05/29/22 2248    Lab Results  Component Value Date   PLT 255 05/31/2022    Diet :  Diet Order             Diet regular Room service appropriate? Yes; Fluid consistency: Thin  Diet effective now                    Inpatient Medications  Scheduled Meds:  diltiazem  90 mg Oral Q12H   enoxaparin (LOVENOX) injection  60 mg Subcutaneous Q24H   famotidine  40 mg Oral Daily   hydrALAZINE  50 mg Oral Q8H   oseltamivir  75 mg Oral BID   predniSONE  40 mg Oral Q breakfast   Continuous Infusions: PRN Meds:.acetaminophen, albuterol, hydrALAZINE, HYDROcodone-acetaminophen  Antibiotics  :    Anti-infectives (From admission, onward)    Start     Dose/Rate Route Frequency Ordered Stop   05/30/22 1000  oseltamivir (TAMIFLU) capsule 75 mg        75 mg Oral 2 times daily 05/29/22 2250 06/04/22 0959   05/30/22 1000  azithromycin (ZITHROMAX) tablet 500  mg        500 mg Oral Daily 05/30/22 0817 05/31/22 0934   05/29/22 2115  oseltamivir (TAMIFLU) capsule 75 mg        75 mg Oral  Once 05/29/22 2112 05/29/22 2158         Objective:   Vitals:   05/30/22 2315 05/31/22 0306 05/31/22 0553 05/31/22 0730  BP: (!) 142/76 (!) 152/77 (!) 152/90 (!) 148/84  Pulse: 99 95  (!) 106  Resp: 17 16  20   Temp: 97.6 F (36.4 C) 97.8 F (36.6 C)  98 F (36.7 C)  TempSrc: Axillary Oral  Oral  SpO2: 94% 90%  93%  Weight:      Height:    5\' 10"  (1.778 m)    Wt Readings from Last 3 Encounters:  05/30/22 136.1 kg  08/11/18 129.7 kg  06/24/18 129.7 kg     Intake/Output Summary (Last 24 hours) at 05/31/2022 0938 Last data filed at 05/31/2022 0734 Gross per 24 hour  Intake 120 ml  Output 1750 ml  Net -1630 ml     Physical Exam  Awake Alert, No new F.N deficits, Normal affect [place].AT,PERRAL Supple Neck, No JVD,   Symmetrical Chest wall movement, Good air movement bilaterally, resolved wheezing RRR,No Gallops, Rubs or new Murmurs,  +ve B.Sounds, Abd Soft, No tenderness,   No Cyanosis, Clubbing or edema      Data Review:    Recent Labs  Lab 05/29/22 2028 05/30/22 0031 05/31/22 0022  WBC 10.1  --  9.0  HGB 14.4 13.6 14.2  HCT 43.7 40.0 44.9  PLT 255  --  255  MCV 92.8  --  92.6  MCH 30.6  --  29.3  MCHC 33.0  --  31.6  RDW 12.9  --  13.1  LYMPHSABS  --   --  1.0  MONOABS  --   --  0.7  EOSABS  --   --  0.0  BASOSABS  --   --  0.0    Recent Labs  Lab 05/29/22 2028 05/30/22 0022 05/30/22 0031 05/30/22 0400 05/30/22 0425 05/31/22 0022  NA 139 138 140  --   --  140  K 3.3* 3.0* 3.0*  --   --  4.7  CL 101 101  --   --   --  101  CO2 29 30  --   --   --  30  ANIONGAP 9 7  --   --   --  9  GLUCOSE 127* 150*  --   --   --  146*  BUN 21* 18  --   --   --  18  CREATININE 0.94 1.09  --   --   --  0.96  CRP  --   --   --  9.8*  --  15.3*  PROCALCITON  --   --   --  <0.10  --  <0.10  LATICACIDVEN 1.2  --   --   --  1.4   --   TSH  --   --   --   --   --  0.566  BNP  --   --   --  22.6  --  35.6  MG  --   --   --  1.7  --  2.5*  CALCIUM 8.8* 8.2*  --   --   --  8.5*     Radiology Reports ECHOCARDIOGRAM COMPLETE  Result Date: 05/30/2022    ECHOCARDIOGRAM REPORT   Patient Name:   Mike Watkins Date of Exam: 05/30/2022 Medical Rec #:  [id number]       Height:       70.0 in Accession #:    [id number]      Weight:       300.0 lb Date of Birth:  1965/09/23       BSA:          2.480 m Patient Age:    56 years        BP:           152/73 mmHg Patient Gender: M               HR:           110 bpm. Exam Location:  Inpatient Procedure: 2D Echo Indications:    acute diastolic chf  History:        Patient has no prior history of Echocardiogram examinations.                 Arrythmias:Atrial Fibrillation; Risk Factors:Hypertension and                 Former Smoker.  Sonographer:    Delcie Roch RDCS Referring Phys: Effie Shy [place] [facility] IMPRESSIONS  1. Left ventricular ejection fraction, by estimation, is 60 to 65%. The left ventricle has normal function. The left ventricle has no regional wall motion abnormalities. Left ventricular diastolic parameters are indeterminate.  2. Right ventricular systolic function is hyperdynamic. The right ventricular size is normal. Tricuspid regurgitation signal is inadequate for assessing PA pressure.  3. The mitral valve is normal in structure. Mild mitral  valve regurgitation.  4. The aortic valve was not well visualized. Aortic valve regurgitation is not visualized. No aortic stenosis is present.  5. The inferior vena cava is normal in size with greater than 50% respiratory variability, suggesting right atrial pressure of 3 mmHg. Comparison(s): No prior Echocardiogram. FINDINGS  Left Ventricle: Left ventricular ejection fraction, by estimation, is 60 to 65%. The left ventricle has normal function. The left ventricle has no regional wall motion abnormalities. The left ventricular internal cavity  size was normal in size. There is  no left ventricular hypertrophy. Left ventricular diastolic parameters are indeterminate. Right Ventricle: The right ventricular size is normal. No increase in right ventricular wall thickness. Right ventricular systolic function is hyperdynamic. Tricuspid regurgitation signal is inadequate for assessing PA pressure. Left Atrium: Left atrial size was normal in size. Right Atrium: Right atrial size was normal in size. Pericardium: Trivial pericardial effusion is present. Presence of epicardial fat layer. Mitral Valve: The mitral valve is normal in structure. Mild mitral valve regurgitation. Tricuspid Valve: The tricuspid valve is normal in structure. Tricuspid valve regurgitation is not demonstrated. No evidence of tricuspid stenosis. Aortic Valve: The aortic valve was not well visualized. Aortic valve regurgitation is not visualized. No aortic stenosis is present. Pulmonic Valve: The pulmonic valve was not well visualized. Pulmonic valve regurgitation is not visualized. Aorta: The aortic root and ascending aorta are structurally normal, with no evidence of dilitation. Venous: The inferior vena cava is normal in size with greater than 50% respiratory variability, suggesting right atrial pressure of 3 mmHg. IAS/Shunts: No atrial level shunt detected by color flow Doppler.  LEFT VENTRICLE PLAX 2D LVIDd:         4.90 cm LVIDs:         3.70 cm LV PW:         1.10 cm LV IVS:        1.00 cm LVOT diam:     2.30 cm LV SV:         89 LV SV Index:   36 LVOT Area:     4.15 cm  RIGHT VENTRICLE             IVC RV Basal diam:  2.80 cm     IVC diam: 1.80 cm RV S prime:     17.20 cm/s TAPSE (M-mode): 2.2 cm LEFT ATRIUM             Index        RIGHT ATRIUM           Index LA diam:        4.30 cm 1.73 cm/m   RA Area:     16.60 cm LA Vol (A2C):   54.3 ml 21.90 ml/m  RA Volume:   43.00 ml  17.34 ml/m LA Vol (A4C):   48.8 ml 19.68 ml/m LA Biplane Vol: 52.6 ml 21.21 ml/m  AORTIC VALVE LVOT Vmax:    129.00 cm/s LVOT Vmean:  88.100 cm/s LVOT VTI:    0.215 m  AORTA Ao Root diam: 3.20 cm Ao Asc diam:  3.40 cm  SHUNTS Systemic VTI:  0.22 m Systemic Diam: 2.30 cm Riley Lam MD Electronically signed by Riley Lam MD Signature Date/Time: 05/30/2022/6:22:42 PM    Final    DG Chest Port 1 View  Result Date: 05/30/2022 CLINICAL DATA:  56 year old male with shortness of breath. EXAM: PORTABLE CHEST 1 VIEW COMPARISON:  Portable chest 05/29/2022 and earlier. FINDINGS: Portable AP upright view at 0643 hours. Low  lung volumes with patchy and indistinct lung base opacity greater on the left. This appears somewhat progressed from yesterday. No air bronchograms. Normal cardiac size and mediastinal contours. Visualized tracheal air column is within normal limits. No pneumothorax or pulmonary edema. No definite pleural effusion. Chronic cervical ACDF. Paucity of bowel gas in the upper abdomen. IMPRESSION: Confluent but indistinct lung base opacity, greater on the left and mildly progressed from yesterday. Favor acute bilateral lung base infection, pneumonia. No definite pleural effusion. PA and lateral chest x-ray may be valuable when feasible. Electronically Signed   By: Odessa Fleming M.D.   On: 05/30/2022 07:03   DG Chest Port 1 View  Result Date: 05/29/2022 CLINICAL DATA:  Fever and cough EXAM: PORTABLE CHEST 1 VIEW COMPARISON:  Radiographs 04/22/2020 FINDINGS: Bilateral airspace opacities in the lower lungs. Question small bilateral pleural effusions. No pneumothorax. Stable cardiomediastinal silhouette. No acute osseous abnormality. Cervical spine fusion hardware. IMPRESSION: Bilateral lower lung airspace opacities suspicious for pneumonia. Electronically Signed   By: Minerva Fester M.D.   On: 05/29/2022 21:31      Signature  -   Susa Raring M.D on 05/31/2022 at 9:38 AM   -  To page go to [email or web address]

## 2022-06-01 ENCOUNTER — Encounter (HOSPITAL_COMMUNITY): Payer: Self-pay | Admitting: Family Medicine

## 2022-06-01 DIAGNOSIS — J9601 Acute respiratory failure with hypoxia: Secondary | ICD-10-CM | POA: Diagnosis not present

## 2022-06-01 LAB — BASIC METABOLIC PANEL
Anion gap: 7 (ref 5–15)
BUN: 25 mg/dL — ABNORMAL HIGH (ref 6–20)
CO2: 31 mmol/L (ref 22–32)
Calcium: 8.7 mg/dL — ABNORMAL LOW (ref 8.9–10.3)
Chloride: 102 mmol/L (ref 98–111)
Creatinine, Ser: 0.9 mg/dL (ref 0.61–1.24)
GFR, Estimated: >60 mL/min (ref >60)
Glucose, Bld: 163 mg/dL — ABNORMAL HIGH (ref 70–99)
Potassium: 3.7 mmol/L (ref 3.5–5.1)
Sodium: 140 mmol/L (ref 135–145)

## 2022-06-01 LAB — C-REACTIVE PROTEIN: CRP: 7.9 mg/dL — ABNORMAL HIGH (ref <1.0)

## 2022-06-01 LAB — CBC WITH DIFFERENTIAL/PLATELET
Abs Immature Granulocytes: 0.08 10*3/uL — ABNORMAL HIGH (ref 0.00–0.07)
Basophils Absolute: 0 10*3/uL (ref 0.0–0.1)
Basophils Relative: 0 %
Eosinophils Absolute: 0 10*3/uL (ref 0.0–0.5)
Eosinophils Relative: 0 %
HCT: 42.8 % (ref 39.0–52.0)
Hemoglobin: 13.3 g/dL (ref 13.0–17.0)
Immature Granulocytes: 1 %
Lymphocytes Relative: 16 %
Lymphs Abs: 1.9 10*3/uL (ref 0.7–4.0)
MCH: 29.4 pg (ref 26.0–34.0)
MCHC: 31.1 g/dL (ref 30.0–36.0)
MCV: 94.5 fL (ref 80.0–100.0)
Monocytes Absolute: 0.8 10*3/uL (ref 0.1–1.0)
Monocytes Relative: 7 %
Neutro Abs: 8.6 10*3/uL — ABNORMAL HIGH (ref 1.7–7.7)
Neutrophils Relative %: 76 %
Platelets: 304 10*3/uL (ref 150–400)
RBC: 4.53 MIL/uL (ref 4.22–5.81)
RDW: 13.1 % (ref 11.5–15.5)
WBC: 11.4 10*3/uL — ABNORMAL HIGH (ref 4.0–10.5)
nRBC: 0 % (ref 0.0–0.2)

## 2022-06-01 LAB — PROCALCITONIN: Procalcitonin: <0.1 ng/mL

## 2022-06-01 LAB — BRAIN NATRIURETIC PEPTIDE: B Natriuretic Peptide: 13.7 pg/mL (ref 0.0–100.0)

## 2022-06-01 LAB — MAGNESIUM: Magnesium: 2.2 mg/dL (ref 1.7–2.4)

## 2022-06-01 MED ORDER — SALINE SPRAY 0.65 % NA SOLN
1.0000 | NASAL | Status: DC | PRN
  Filled 2022-06-01: qty 44

## 2022-06-01 MED ORDER — MUPIROCIN 2 % EX OINT
1.0000 | TOPICAL_OINTMENT | Freq: Two times a day (BID) | CUTANEOUS | Status: DC
  Administered 2022-06-01: 1 via NASAL
  Filled 2022-06-01 (×2): qty 22

## 2022-06-01 MED ORDER — PREDNISONE 20 MG PO TABS
30.0000 mg | ORAL_TABLET | Freq: Every day | ORAL | Status: DC
  Administered 2022-06-01 – 2022-06-02 (×2): 30 mg via ORAL
  Filled 2022-06-01 (×2): qty 1

## 2022-06-01 MED ORDER — CHLORHEXIDINE GLUCONATE CLOTH 2 % EX PADS
6.0000 | MEDICATED_PAD | Freq: Every day | CUTANEOUS | Status: DC
  Administered 2022-06-01 – 2022-06-02 (×2): 6 via TOPICAL

## 2022-06-01 NOTE — Progress Notes (Signed)
Mobility Specialist Progress Note    06/01/22 0940  Mobility  Activity Ambulated independently in hallway  Level of Assistance Standby assist, set-up cues, supervision of patient - no hands on  Assistive Device None  Distance Ambulated (ft) 360 ft  Activity Response Tolerated fair  Mobility Referral Yes  $Mobility charge 1 Mobility   Pre-Mobility: 116 HR, 92% SpO2 During Mobility: 144 HR Post-Mobility: 128 HR, 88% SpO2  Pt received in chair and agreeable. No complaints on walk. Encouraged pursed lip breathing. SpO2 as low as 85% on RA requiring a standing rest break to recover to 88-90% on RA. Returned to chair with call bell in reach. RN notified.   [place] Mobility Specialist  Please Neurosurgeon or Rehab Office at [phone]

## 2022-06-01 NOTE — Progress Notes (Signed)
PROGRESS NOTE                                                                                                                                                                                                             Patient Demographics:    Mike Watkins, is a 56 y.o. male, DOB - [birth year], HEN:[id number]  Outpatient Primary MD for the patient is [facility]    LOS - 2  Admit date - 05/29/2022    Chief Complaint  Patient presents with   Shortness of Breath       Brief Narrative (HPI from H&P)   56 y.o. male with medical history significant of hypertension, chronic knee pain on opioids who presents from urgent care with hypoxia.  In the ER he was diagnosed with influenza A pneumonia along with acute hypoxic respiratory failure and admitted to the hospital.   Subjective:   Patient in bed, appears comfortable, denies any headache, no fever, no chest pain or pressure, ++ improved  shortness of breath , no abdominal pain. No new focal weakness.    Assessment  & Plan :   Acute respiratory failure with hypoxia (HCC) - due to influenza A pneumonia with loud wheezing and shortness of breath, he was placed on Tamiflu, IV steroids along with a single dose Lasix on 05/30/2022.  Thankfully his breathing has improved and wheezing has resolved, echocardiogram stable, also seen by pulmonary.  Appreciate their input.  There is gradual but definite improvement on 05/31/2022.  Continue present line of treatment.  Rapidly tapering of steroids, will check ambulatory pulse ox, if stable likely discharge on 06/02/2022.  Encouraged to sit in chair use flutter valve and I-S for pulmonary toiletry.  Has remote history of smoking.  Question undiagnosed COPD.  4 days of azithromycin as well for underlying questionable COPD exacerbation.  Obesity (BMI 30-39.9) noted follow-up with PCP for weight loss.  Hypokalemia -  replaced  Essential hypertension  - added Cardizem + Hydralazine.  Chronic pain of left knee  - Continue on home hydrocodone-acetaminophen, PRN Narcan       Condition - Extremely Guarded  Family Communication  :  None  Code Status :  Full  Consults  :  PCCP  PUD Prophylaxis : pepcid   Procedures  :     TTE - 1. Left ventricular  ejection fraction, by estimation, is 60 to 65%. The left ventricle has normal function. The left ventricle has no regional wall motion abnormalities. Left ventricular diastolic parameters are indeterminate.  2. Right ventricular systolic function is hyperdynamic. The right ventricular size is normal. Tricuspid regurgitation signal is inadequate for assessing PA pressure.  3. The mitral valve is normal in structure. Mild mitral valve regurgitation.  4. The aortic valve was not well visualized. Aortic valve regurgitation is not visualized. No aortic stenosis is present.  5. The inferior vena cava is normal in size with greater than 50% respiratory variability, suggesting right atrial pressure of 3 mmHg      Disposition Plan  :    Status is: Observation  DVT Prophylaxis  :  Lovenox  SCDs Start: 05/29/22 2248    Lab Results  Component Value Date   PLT 304 06/01/2022    Diet :  Diet Order             Diet regular Room service appropriate? Yes; Fluid consistency: Thin  Diet effective now                    Inpatient Medications  Scheduled Meds:  diltiazem  90 mg Oral Q12H   enoxaparin (LOVENOX) injection  60 mg Subcutaneous Q24H   famotidine  40 mg Oral Daily   hydrALAZINE  50 mg Oral Q8H   oseltamivir  75 mg Oral BID   [START ON 06/02/2022] predniSONE  30 mg Oral Q breakfast   Continuous Infusions: PRN Meds:.acetaminophen, albuterol, hydrALAZINE, HYDROcodone-acetaminophen, sodium chloride  Antibiotics  :    Anti-infectives (From admission, onward)    Start     Dose/Rate Route Frequency Ordered Stop   05/30/22 1000  oseltamivir  (TAMIFLU) capsule 75 mg        75 mg Oral 2 times daily 05/29/22 2250 06/04/22 0959   05/30/22 1000  azithromycin (ZITHROMAX) tablet 500 mg        500 mg Oral Daily 05/30/22 0817 05/31/22 0934   05/29/22 2115  oseltamivir (TAMIFLU) capsule 75 mg        75 mg Oral  Once 05/29/22 2112 05/29/22 2158         Objective:   Vitals:   05/31/22 1241 05/31/22 1951 05/31/22 2311 06/01/22 0320  BP:  (!) 150/86 (!) 154/93 126/63  Pulse: (!) 104 (!) 110 (!) 101 93  Resp: 19 20 18 18   Temp:  97.7 F (36.5 C) 97.7 F (36.5 C) 97.9 F (36.6 C)  TempSrc:  Oral Oral Oral  SpO2: 92%  94% 94%  Weight:      Height:        Wt Readings from Last 3 Encounters:  05/30/22 136.1 kg  08/11/18 129.7 kg  06/24/18 129.7 kg     Intake/Output Summary (Last 24 hours) at 06/01/2022 0807 Last data filed at 06/01/2022 0600 Gross per 24 hour  Intake 480 ml  Output 700 ml  Net -220 ml     Physical Exam  Awake Alert, No new F.N deficits, Normal affect [place].AT,PERRAL Supple Neck, No JVD,   Symmetrical Chest wall movement, Good air movement bilaterally, CTAB RRR,No Gallops, Rubs or new Murmurs,  +ve B.Sounds, Abd Soft, No tenderness,   No Cyanosis, Clubbing or edema       Data Review:    Recent Labs  Lab 05/29/22 2028 05/30/22 0031 05/31/22 0022 06/01/22 0022  WBC 10.1  --  9.0 11.4*  HGB 14.4 13.6 14.2 13.3  HCT 43.7 40.0 44.9 42.8  PLT 255  --  255 304  MCV 92.8  --  92.6 94.5  MCH 30.6  --  29.3 29.4  MCHC 33.0  --  31.6 31.1  RDW 12.9  --  13.1 13.1  LYMPHSABS  --   --  1.0 1.9  MONOABS  --   --  0.7 0.8  EOSABS  --   --  0.0 0.0  BASOSABS  --   --  0.0 0.0    Recent Labs  Lab 05/29/22 2028 05/30/22 0022 05/30/22 0031 05/30/22 0400 05/30/22 0425 05/31/22 0022 06/01/22 0022  NA 139 138 140  --   --  140 140  K 3.3* 3.0* 3.0*  --   --  4.7 3.7  CL 101 101  --   --   --  101 102  CO2 29 30  --   --   --  30 31  ANIONGAP 9 7  --   --   --  9 7  GLUCOSE 127* 150*  --    --   --  146* 163*  BUN 21* 18  --   --   --  18 25*  CREATININE 0.94 1.09  --   --   --  0.96 0.90  CRP  --   --   --  9.8*  --  15.3* 7.9*  PROCALCITON  --   --   --  <0.10  --  <0.10 <0.10  LATICACIDVEN 1.2  --   --   --  1.4  --   --   TSH  --   --   --   --   --  0.566  --   BNP  --   --   --  22.6  --  35.6 13.7  MG  --   --   --  1.7  --  2.5* 2.2  CALCIUM 8.8* 8.2*  --   --   --  8.5* 8.7*     Radiology Reports ECHOCARDIOGRAM COMPLETE  Result Date: 05/30/2022    ECHOCARDIOGRAM REPORT   Patient Name:   OLA FAWVER Date of Exam: 05/30/2022 Medical Rec #:  [id number]       Height:       70.0 in Accession #:    [id number]      Weight:       300.0 lb Date of Birth:  1966/04/11       BSA:          2.480 m Patient Age:    56 years        BP:           152/73 mmHg Patient Gender: M               HR:           110 bpm. Exam Location:  Inpatient Procedure: 2D Echo Indications:    acute diastolic chf  History:        Patient has no prior history of Echocardiogram examinations.                 Arrythmias:Atrial Fibrillation; Risk Factors:Hypertension and                 Former Smoker.  Sonographer:    Delcie Roch RDCS Referring Phys: Effie Shy [facility] [place] [facility] IMPRESSIONS  1. Left ventricular ejection fraction, by estimation, is 60 to 65%. The left ventricle has normal function. The  left ventricle has no regional wall motion abnormalities. Left ventricular diastolic parameters are indeterminate.  2. Right ventricular systolic function is hyperdynamic. The right ventricular size is normal. Tricuspid regurgitation signal is inadequate for assessing PA pressure.  3. The mitral valve is normal in structure. Mild mitral valve regurgitation.  4. The aortic valve was not well visualized. Aortic valve regurgitation is not visualized. No aortic stenosis is present.  5. The inferior vena cava is normal in size with greater than 50% respiratory variability, suggesting right atrial pressure of 3 mmHg.  Comparison(s): No prior Echocardiogram. FINDINGS  Left Ventricle: Left ventricular ejection fraction, by estimation, is 60 to 65%. The left ventricle has normal function. The left ventricle has no regional wall motion abnormalities. The left ventricular internal cavity size was normal in size. There is  no left ventricular hypertrophy. Left ventricular diastolic parameters are indeterminate. Right Ventricle: The right ventricular size is normal. No increase in right ventricular wall thickness. Right ventricular systolic function is hyperdynamic. Tricuspid regurgitation signal is inadequate for assessing PA pressure. Left Atrium: Left atrial size was normal in size. Right Atrium: Right atrial size was normal in size. Pericardium: Trivial pericardial effusion is present. Presence of epicardial fat layer. Mitral Valve: The mitral valve is normal in structure. Mild mitral valve regurgitation. Tricuspid Valve: The tricuspid valve is normal in structure. Tricuspid valve regurgitation is not demonstrated. No evidence of tricuspid stenosis. Aortic Valve: The aortic valve was not well visualized. Aortic valve regurgitation is not visualized. No aortic stenosis is present. Pulmonic Valve: The pulmonic valve was not well visualized. Pulmonic valve regurgitation is not visualized. Aorta: The aortic root and ascending aorta are structurally normal, with no evidence of dilitation. Venous: The inferior vena cava is normal in size with greater than 50% respiratory variability, suggesting right atrial pressure of 3 mmHg. IAS/Shunts: No atrial level shunt detected by color flow Doppler.  LEFT VENTRICLE PLAX 2D LVIDd:         4.90 cm LVIDs:         3.70 cm LV PW:         1.10 cm LV IVS:        1.00 cm LVOT diam:     2.30 cm LV SV:         89 LV SV Index:   36 LVOT Area:     4.15 cm  RIGHT VENTRICLE             IVC RV Basal diam:  2.80 cm     IVC diam: 1.80 cm RV S prime:     17.20 cm/s TAPSE (M-mode): 2.2 cm LEFT ATRIUM              Index        RIGHT ATRIUM           Index LA diam:        4.30 cm 1.73 cm/m   RA Area:     16.60 cm LA Vol (A2C):   54.3 ml 21.90 ml/m  RA Volume:   43.00 ml  17.34 ml/m LA Vol (A4C):   48.8 ml 19.68 ml/m LA Biplane Vol: 52.6 ml 21.21 ml/m  AORTIC VALVE LVOT Vmax:   129.00 cm/s LVOT Vmean:  88.100 cm/s LVOT VTI:    0.215 m  AORTA Ao Root diam: 3.20 cm Ao Asc diam:  3.40 cm  SHUNTS Systemic VTI:  0.22 m Systemic Diam: 2.30 cm Riley Lam MD Electronically signed by Riley Lam MD Signature Date/Time:  05/30/2022/6:22:42 PM    Final    DG Chest Port 1 View  Result Date: 05/30/2022 CLINICAL DATA:  56 year old male with shortness of breath. EXAM: PORTABLE CHEST 1 VIEW COMPARISON:  Portable chest 05/29/2022 and earlier. FINDINGS: Portable AP upright view at 0643 hours. Low lung volumes with patchy and indistinct lung base opacity greater on the left. This appears somewhat progressed from yesterday. No air bronchograms. Normal cardiac size and mediastinal contours. Visualized tracheal air column is within normal limits. No pneumothorax or pulmonary edema. No definite pleural effusion. Chronic cervical ACDF. Paucity of bowel gas in the upper abdomen. IMPRESSION: Confluent but indistinct lung base opacity, greater on the left and mildly progressed from yesterday. Favor acute bilateral lung base infection, pneumonia. No definite pleural effusion. PA and lateral chest x-ray may be valuable when feasible. Electronically Signed   By: Odessa Fleming M.D.   On: 05/30/2022 07:03   DG Chest Port 1 View  Result Date: 05/29/2022 CLINICAL DATA:  Fever and cough EXAM: PORTABLE CHEST 1 VIEW COMPARISON:  Radiographs 04/22/2020 FINDINGS: Bilateral airspace opacities in the lower lungs. Question small bilateral pleural effusions. No pneumothorax. Stable cardiomediastinal silhouette. No acute osseous abnormality. Cervical spine fusion hardware. IMPRESSION: Bilateral lower lung airspace opacities suspicious for  pneumonia. Electronically Signed   By: Minerva Fester M.D.   On: 05/29/2022 21:31      Signature  -   Susa Raring M.D on 06/01/2022 at 8:07 AM   -  To page go to [email or web address]

## 2022-06-01 NOTE — Plan of Care (Signed)

## 2022-06-01 NOTE — Progress Notes (Signed)
Transferred to 2 west room 8 by wheelchair. Belongings taken by wife. Report given to Rn.

## 2022-06-01 NOTE — Progress Notes (Signed)
Patient received to room 2W08 alert and oriented x4. Patient oriented to unit and educated on how and when to call for assistance-verbalized understanding. Patient support person currently at bedside. Patient resting comfortably in chair. Patient does not complain of pain or voice any concerns at this time. Bed lowered and call bell within reach.

## 2022-06-02 ENCOUNTER — Other Ambulatory Visit (HOSPITAL_COMMUNITY): Payer: Self-pay

## 2022-06-02 DIAGNOSIS — J9601 Acute respiratory failure with hypoxia: Secondary | ICD-10-CM | POA: Diagnosis not present

## 2022-06-02 MED ORDER — HYDRALAZINE HCL 50 MG PO TABS
50.0000 mg | ORAL_TABLET | Freq: Three times a day (TID) | ORAL | 0 refills | Status: DC
  Filled 2022-06-02: qty 90, 30d supply, fill #0

## 2022-06-02 MED ORDER — OSELTAMIVIR PHOSPHATE 75 MG PO CAPS
75.0000 mg | ORAL_CAPSULE | Freq: Two times a day (BID) | ORAL | 0 refills | Status: DC
  Filled 2022-06-02: qty 4, 2d supply, fill #0

## 2022-06-02 MED ORDER — AZITHROMYCIN 250 MG PO TABS
250.0000 mg | ORAL_TABLET | Freq: Every day | ORAL | 0 refills | Status: DC
  Filled 2022-06-02: qty 2, 1d supply, fill #0

## 2022-06-02 MED ORDER — METHYLPREDNISOLONE 4 MG PO TBPK
ORAL_TABLET | ORAL | 0 refills | Status: DC
  Filled 2022-06-02: qty 21, 21d supply, fill #0

## 2022-06-02 MED ORDER — FUROSEMIDE 10 MG/ML IJ SOLN: 40.0000 mg | Freq: Once | INTRAMUSCULAR | Status: DC

## 2022-06-02 MED ORDER — POTASSIUM CHLORIDE CRYS ER 20 MEQ PO TBCR
40.0000 meq | EXTENDED_RELEASE_TABLET | Freq: Once | ORAL | Status: AC
  Administered 2022-06-02: 40 meq via ORAL
  Filled 2022-06-02: qty 2

## 2022-06-02 MED ORDER — DILTIAZEM HCL 90 MG PO TABS
90.0000 mg | ORAL_TABLET | Freq: Two times a day (BID) | ORAL | 0 refills | Status: DC
  Filled 2022-06-02: qty 60, 30d supply, fill #0

## 2022-06-02 NOTE — Discharge Summary (Signed)
Mike Watkins:[id number] DOB: 18-Feb-1966 DOA: 05/29/2022  PCP: [facility]  Admit date: 05/29/2022  Discharge date: 06/02/2022  Admitted From: Home   Disposition:  Home   Recommendations for Outpatient Follow-up:   Follow up with PCP in 1-2 weeks  PCP Please obtain BMP/CBC, 2 view CXR in 1week,  (see Discharge instructions)   PCP Please follow up on the following pending results:    Home Health: None   Equipment/Devices: o2  Consultations: Pulmonary Discharge Condition: Stable    CODE STATUS: Full    Diet Recommendation: Heart Healthy     Chief Complaint  Patient presents with   Shortness of Breath     Brief history of present illness from the day of admission and additional interim summary    56 y.o. male with medical history significant of hypertension, chronic knee pain on opioids who presents from urgent care with hypoxia.  In the ER he was diagnosed with influenza A pneumonia along with acute hypoxic respiratory failure and admitted to the hospital.                                                                  Hospital Course   Acute respiratory failure with hypoxia (HCC) - due to influenza A pneumonia with loud wheezing and shortness of breath, he was placed on Tamiflu, IV steroids along with a single dose Lasix on 05/30/2022.  Thankfully his breathing has improved and wheezing has resolved, echocardiogram stable, also seen by pulmonary.  Appreciate their input.  There is gradual but definite improvement on 05/31/2022 >> 06/01/22 >> 06/02/22.  Continue present line of treatment.  Rapidly tapering of steroids, did qualify for Home o2 upon ambulation, at rest symptom free and eager to go home NOW. Will get PO steroid taper, finish Tamiflu and Azithro course.   Encouraged to sit in  chair use flutter valve and I-S for pulmonary toiletry.  Has remote history of smoking.  Question undiagnosed COPD.  4 days of azithromycin as well for underlying questionable COPD exacerbation.   Obesity (BMI 30-39.9) noted follow-up with PCP for weight loss.   Hypokalemia - replaced   Essential hypertension  - added Cardizem + Hydralazine. PCP to monitor.   Chronic pain of left knee  - Continue on home ++ HIGH DOSE hydrocodone-acetaminophen, please taper off gradually - PCP.     Discharge diagnosis     Principal Problem:   Acute respiratory failure with hypoxia (HCC) Active Problems:   Chronic pain of left knee   Influenza A with pneumonia   Essential hypertension   Hypokalemia   Obesity (BMI 30-39.9)    Discharge instructions    Discharge Instructions     Diet - low sodium heart healthy   Complete by: As directed    Discharge  instructions   Complete by: As directed    Follow with Primary MD [facility] in 7 days   Get CBC, CMP, 2 view Chest X ray -  checked next visit with your primary MD    Activity: As tolerated with Full fall precautions use walker/cane & assistance as needed  Disposition Home     Diet: Heart Healthy    Special Instructions: If you have smoked or chewed Tobacco  in the last 2 yrs please stop smoking, stop any regular Alcohol  and or any Recreational drug use.  On your next visit with your primary care physician please Get Medicines reviewed and adjusted.  Please request your Prim.MD to go over all Hospital Tests and Procedure/Radiological results at the follow up, please get all Hospital records sent to your Prim MD by signing hospital release before you go home.  If you experience worsening of your admission symptoms, develop shortness of breath, life threatening emergency, suicidal or homicidal thoughts you must seek medical attention immediately by calling 911 or calling your MD immediately  if symptoms less severe.  You Must  read complete instructions/literature along with all the possible adverse reactions/side effects for all the Medicines you take and that have been prescribed to you. Take any new Medicines after you have completely understood and accpet all the possible adverse reactions/side effects.   Increase activity slowly   Complete by: As directed        Discharge Medications   Allergies as of 06/02/2022       Reactions   Penicillins Shortness Of Breath   Has patient had a PCN reaction causing immediate rash, facial/tongue/throat swelling, SOB or lightheadedness with hypotension: Yes Has patient had a PCN reaction causing severe rash involving mucus membranes or skin necrosis: No Has patient had a PCN reaction that required hospitalization No Has patient had a PCN reaction occurring within the last 10 years: No If all of the above answers are "NO", then may proceed with Cephalosporin use.   Sulfa Antibiotics Shortness Of Breath   "Hard to breathe."        Medication List     STOP taking these medications    ibuprofen 200 MG tablet Commonly known as: ADVIL   predniSONE 20 MG tablet Commonly known as: DELTASONE       TAKE these medications    acetaminophen 500 MG tablet Commonly known as: TYLENOL Take 1,000 mg by mouth every 6 (six) hours as needed for moderate pain.   azithromycin 250 MG tablet Commonly known as: ZITHROMAX Take 1 tablet (250 mg total) by mouth daily. Take first 2 tablets together, then 1 every day until finished.   diltiazem 90 MG tablet Commonly known as: CARDIZEM Take 1 tablet (90 mg total) by mouth every 12 (twelve) hours.   famotidine 40 MG tablet Commonly known as: PEPCID Take 1 tablet (40 mg total) by mouth daily for 10 days.   hydrALAZINE 50 MG tablet Commonly known as: APRESOLINE Take 1 tablet (50 mg total) by mouth every 8 (eight) hours.   HYDROcodone-acetaminophen 7.5-325 MG tablet Commonly known as: NORCO Take 1-2 tablets by mouth 2 (two)  times daily as needed for moderate pain.   methylPREDNISolone 4 MG Tbpk tablet Commonly known as: MEDROL DOSEPAK follow package directions   oseltamivir 75 MG capsule Commonly known as: TAMIFLU Take 1 capsule (75 mg total) by mouth 2 (two) times daily.   promethazine-dextromethorphan 6.25-15 MG/5ML syrup Commonly known as: PROMETHAZINE-DM Take 5 mLs by  mouth 4 (four) times daily as needed for cough.               Durable Medical Equipment  (From admission, onward)           Start     Ordered   06/01/22 1327  For home use only DME oxygen  Once       Question Answer Comment  Length of Need 6 Months   Mode or (Route) Nasal cannula   Liters per Minute 2   Frequency Continuous (stationary and portable oxygen unit needed)   Oxygen conserving device Yes   Oxygen delivery system Gas      06/01/22 1326             Follow-up Information     [facility] Schedule an appointment as soon as possible for a visit in 1 week(s).   Contact information: [address] [place] [address] [phone]                 Major procedures and Radiology Reports - PLEASE review detailed and final reports thoroughly  -      ECHOCARDIOGRAM COMPLETE  Result Date: 05/30/2022    ECHOCARDIOGRAM REPORT   Patient Name:   Mike Watkins Date of Exam: 05/30/2022 Medical Rec #:  [id number]       Height:       70.0 in Accession #:    [id number]      Weight:       300.0 lb Date of Birth:  Feb 28, 1966       BSA:          2.480 m Patient Age:    56 years        BP:           152/73 mmHg Patient Gender: M               HR:           110 bpm. Exam Location:  Inpatient Procedure: 2D Echo Indications:    acute diastolic chf  History:        Patient has no prior history of Echocardiogram examinations.                 Arrythmias:Atrial Fibrillation; Risk Factors:Hypertension and                 Former Smoker.  Sonographer:    Delcie RochLauren Pennington RDCS Referring Phys: [id number] [id number]  Stanford ScotlandPRASHANT K [facility] IMPRESSIONS  1. Left ventricular ejection fraction, by estimation, is 60 to 65%. The left ventricle has normal function. The left ventricle has no regional wall motion abnormalities. Left ventricular diastolic parameters are indeterminate.  2. Right ventricular systolic function is hyperdynamic. The right ventricular size is normal. Tricuspid regurgitation signal is inadequate for assessing PA pressure.  3. The mitral valve is normal in structure. Mild mitral valve regurgitation.  4. The aortic valve was not well visualized. Aortic valve regurgitation is not visualized. No aortic stenosis is present.  5. The inferior vena cava is normal in size with greater than 50% respiratory variability, suggesting right atrial pressure of 3 mmHg. Comparison(s): No prior Echocardiogram. FINDINGS  Left Ventricle: Left ventricular ejection fraction, by estimation, is 60 to 65%. The left ventricle has normal function. The left ventricle has no regional wall motion abnormalities. The left ventricular internal cavity size was normal in size. There is  no left ventricular hypertrophy. Left ventricular diastolic parameters are indeterminate. Right Ventricle: The right ventricular  size is normal. No increase in right ventricular wall thickness. Right ventricular systolic function is hyperdynamic. Tricuspid regurgitation signal is inadequate for assessing PA pressure. Left Atrium: Left atrial size was normal in size. Right Atrium: Right atrial size was normal in size. Pericardium: Trivial pericardial effusion is present. Presence of epicardial fat layer. Mitral Valve: The mitral valve is normal in structure. Mild mitral valve regurgitation. Tricuspid Valve: The tricuspid valve is normal in structure. Tricuspid valve regurgitation is not demonstrated. No evidence of tricuspid stenosis. Aortic Valve: The aortic valve was not well visualized. Aortic valve regurgitation is not visualized. No aortic stenosis is present.  Pulmonic Valve: The pulmonic valve was not well visualized. Pulmonic valve regurgitation is not visualized. Aorta: The aortic root and ascending aorta are structurally normal, with no evidence of dilitation. Venous: The inferior vena cava is normal in size with greater than 50% respiratory variability, suggesting right atrial pressure of 3 mmHg. IAS/Shunts: No atrial level shunt detected by color flow Doppler.  LEFT VENTRICLE PLAX 2D LVIDd:         4.90 cm LVIDs:         3.70 cm LV PW:         1.10 cm LV IVS:        1.00 cm LVOT diam:     2.30 cm LV SV:         89 LV SV Index:   36 LVOT Area:     4.15 cm  RIGHT VENTRICLE             IVC RV Basal diam:  2.80 cm     IVC diam: 1.80 cm RV S prime:     17.20 cm/s TAPSE (M-mode): 2.2 cm LEFT ATRIUM             Index        RIGHT ATRIUM           Index LA diam:        4.30 cm 1.73 cm/m   RA Area:     16.60 cm LA Vol (A2C):   54.3 ml 21.90 ml/m  RA Volume:   43.00 ml  17.34 ml/m LA Vol (A4C):   48.8 ml 19.68 ml/m LA Biplane Vol: 52.6 ml 21.21 ml/m  AORTIC VALVE LVOT Vmax:   129.00 cm/s LVOT Vmean:  88.100 cm/s LVOT VTI:    0.215 m  AORTA Ao Root diam: 3.20 cm Ao Asc diam:  3.40 cm  SHUNTS Systemic VTI:  0.22 m Systemic Diam: 2.30 cm Riley Lam MD Electronically signed by Riley Lam MD Signature Date/Time: 05/30/2022/6:22:42 PM    Final    DG Chest Port 1 View  Result Date: 05/30/2022 CLINICAL DATA:  56 year old male with shortness of breath. EXAM: PORTABLE CHEST 1 VIEW COMPARISON:  Portable chest 05/29/2022 and earlier. FINDINGS: Portable AP upright view at 0643 hours. Low lung volumes with patchy and indistinct lung base opacity greater on the left. This appears somewhat progressed from yesterday. No air bronchograms. Normal cardiac size and mediastinal contours. Visualized tracheal air column is within normal limits. No pneumothorax or pulmonary edema. No definite pleural effusion. Chronic cervical ACDF. Paucity of bowel gas in the upper  abdomen. IMPRESSION: Confluent but indistinct lung base opacity, greater on the left and mildly progressed from yesterday. Favor acute bilateral lung base infection, pneumonia. No definite pleural effusion. PA and lateral chest x-ray may be valuable when feasible. Electronically Signed   By: Odessa Fleming M.D.   On: 05/30/2022  07:03   DG Chest Port 1 View  Result Date: 05/29/2022 CLINICAL DATA:  Fever and cough EXAM: PORTABLE CHEST 1 VIEW COMPARISON:  Radiographs 04/22/2020 FINDINGS: Bilateral airspace opacities in the lower lungs. Question small bilateral pleural effusions. No pneumothorax. Stable cardiomediastinal silhouette. No acute osseous abnormality. Cervical spine fusion hardware. IMPRESSION: Bilateral lower lung airspace opacities suspicious for pneumonia. Electronically Signed   By: Minerva Fester M.D.   On: 05/29/2022 21:31     Today   Subjective    Mike Watkins today has no headache,no chest abdominal pain,no new weakness tingling or numbness, feels much better wants to go home today.    Objective   Blood pressure (!) 140/78, pulse 92, temperature (!) 97.5 F (36.4 C), temperature source Oral, resp. rate 18, height 5\' 10"  (1.778 m), weight 136.1 kg, SpO2 93 %.  No intake or output data in the 24 hours ending 06/02/22 0757  Exam  Awake Alert, No new F.N deficits,    [place].AT,PERRAL Supple Neck,   Symmetrical Chest wall movement, Good air movement bilaterally, CTAB RRR,No Gallops,   +ve B.Sounds, Abd Soft, Non tender,  No Cyanosis, Clubbing or edema    Data Review   Recent Labs  Lab 05/29/22 2028 05/30/22 0031 05/31/22 0022 06/01/22 0022  WBC 10.1  --  9.0 11.4*  HGB 14.4 13.6 14.2 13.3  HCT 43.7 40.0 44.9 42.8  PLT 255  --  255 304  MCV 92.8  --  92.6 94.5  MCH 30.6  --  29.3 29.4  MCHC 33.0  --  31.6 31.1  RDW 12.9  --  13.1 13.1  LYMPHSABS  --   --  1.0 1.9  MONOABS  --   --  0.7 0.8  EOSABS  --   --  0.0 0.0  BASOSABS  --   --  0.0 0.0    Recent Labs  Lab  05/29/22 2028 05/30/22 0022 05/30/22 0031 05/30/22 0400 05/30/22 0425 05/31/22 0022 06/01/22 0022  NA 139 138 140  --   --  140 140  K 3.3* 3.0* 3.0*  --   --  4.7 3.7  CL 101 101  --   --   --  101 102  CO2 29 30  --   --   --  30 31  ANIONGAP 9 7  --   --   --  9 7  GLUCOSE 127* 150*  --   --   --  146* 163*  BUN 21* 18  --   --   --  18 25*  CREATININE 0.94 1.09  --   --   --  0.96 0.90  CRP  --   --   --  9.8*  --  15.3* 7.9*  PROCALCITON  --   --   --  <0.10  --  <0.10 <0.10  LATICACIDVEN 1.2  --   --   --  1.4  --   --   TSH  --   --   --   --   --  0.566  --   BNP  --   --   --  22.6  --  35.6 13.7  MG  --   --   --  1.7  --  2.5* 2.2  CALCIUM 8.8* 8.2*  --   --   --  8.5* 8.7*     Total Time in preparing paper work, data evaluation and todays exam - 35 minutes  Signature  -  Susa Raring M.D on 06/02/2022 at 7:57 AM   -  To page go to [email or web address]

## 2022-06-02 NOTE — Plan of Care (Signed)

## 2022-06-02 NOTE — Discharge Instructions (Signed)
Follow with [facility] in 7 days   Get CBC, CMP, 2 view Chest X ray -  checked next visit with your primary MD    Activity: As tolerated with Full fall precautions use walker/cane & assistance as needed  Disposition Home     Diet: Heart Healthy    Special Instructions: If you have smoked or chewed Tobacco  in the last 2 yrs please stop smoking, stop any regular Alcohol  and or any Recreational drug use.  On your next visit with your primary care physician please Get Medicines reviewed and adjusted.  Please request your Prim.MD to go over all Hospital Tests and Procedure/Radiological results at the follow up, please get all Hospital records sent to your Prim MD by signing hospital release before you go home.  If you experience worsening of your admission symptoms, develop shortness of breath, life threatening emergency, suicidal or homicidal thoughts you must seek medical attention immediately by calling 911 or calling your MD immediately  if symptoms less severe.  You Must read complete instructions/literature along with all the possible adverse reactions/side effects for all the Medicines you take and that have been prescribed to you. Take any new Medicines after you have completely understood and accpet all the possible adverse reactions/side effects.

## 2022-06-03 LAB — CULTURE, BLOOD (ROUTINE X 2)
Culture: NO GROWTH
Culture: NO GROWTH
Special Requests: ADEQUATE

## 2022-09-23 ENCOUNTER — Ambulatory Visit
Admission: EM | Admit: 2022-09-23 | Discharge: 2022-09-23 | Disposition: A | Discharge status: Home / Self Care | Payer: BC Managed Care – PPO | Attending: Nurse Practitioner | Admitting: Nurse Practitioner

## 2022-09-23 DIAGNOSIS — J069 Acute upper respiratory infection, unspecified: Secondary | ICD-10-CM

## 2022-09-23 MED ORDER — ALBUTEROL SULFATE HFA 108 (90 BASE) MCG/ACT IN AERS: 1.0000 | INHALATION_SPRAY | Freq: Four times a day (QID) | RESPIRATORY_TRACT | 0 refills | Status: DC | PRN

## 2022-09-23 MED ORDER — PROMETHAZINE-DM 6.25-15 MG/5ML PO SYRP: 5.0000 mL | ORAL_SOLUTION | Freq: Four times a day (QID) | ORAL | 0 refills | Status: DC | PRN

## 2022-09-23 NOTE — ED Provider Notes (Addendum)
[facility]    CSN: [id number] Arrival date & time: 09/23/22  1640      History   Chief Complaint Chief Complaint  Patient presents with   Shortness of Breath    HPI Mike Watkins is a 57 y.o. male  presents for evaluation of URI symptoms for 1 days. Patient reports associated symptoms of cough, congestion, sore throat. Denies N/V/D,'s, chills, body aches, ear pain, shortness of breath. Patient does not have a hx of asthma.  Is a previous smoker that quit 20 years ago.  No known sick contacts.  Pt has taken Delsym OTC for symptoms.  States he had pneumonia in December.  Pt has no other concerns at this time.    Shortness of Breath Associated symptoms: cough and sore throat     Past Medical History:  Diagnosis Date   Arthritis    Hypertension    Penile lesion     Patient Active Problem List   Diagnosis Date Noted   Hypokalemia 05/30/2022   Obesity (BMI 30-39.9) 05/30/2022   Acute respiratory failure with hypoxia 05/29/2022   Influenza A with pneumonia 05/29/2022   Essential hypertension 05/29/2022   Right upper extremity numbness 08/14/2018   Chronic pain of left knee 02/07/2017   Fall -multiple falls in the past year. 08/29/2016   Acute pain of right knee 08/29/2016   Contusion of right hand 07/14/2016   Ganglion of flexor tendon sheath of right middle finger 06/02/2016   Impingement syndrome of left shoulder 04/20/2016    Past Surgical History:  Procedure Laterality Date   ANTERIOR CERVICAL DECOMP/DISCECTOMY FUSION  12-31-2002   C5 -- C7   CYST EXCISION     pain in r/hand-middle finger   HAND SURGERY     PENILE BIOPSY N/A 08/23/2012   Procedure: EXCISIONAL BIOPSY OF PENILE LESION;  Surgeon: Milford Cage, MD;  Location: [facility];  Service: Urology;  Laterality: N/A;  EXCISIONAL BIOPSY OF PENILE LESION     POSTERIOR FUSION CERVICAL SPINE  02-26-2005   C5 -- C7       Home Medications    Prior to Admission  medications   Medication Sig Start Date End Date Taking? Authorizing Provider  albuterol (VENTOLIN HFA) 108 (90 Base) MCG/ACT inhaler Inhale 1-2 puffs into the lungs every 6 (six) hours as needed for wheezing or shortness of breath. 09/23/22  Yes Radford Pax, NP  promethazine-dextromethorphan (PROMETHAZINE-DM) 6.25-15 MG/5ML syrup Take 5 mLs by mouth 4 (four) times daily as needed for cough. 09/23/22  Yes Radford Pax, NP  acetaminophen (TYLENOL) 500 MG tablet Take 1,000 mg by mouth every 6 (six) hours as needed for moderate pain.    [provider]  azithromycin (ZITHROMAX) 250 MG tablet Take 2 tablets by mouth once. 06/02/22   Leroy Sea, MD  diltiazem (CARDIZEM) 90 MG tablet Take 1 tablet (90 mg total) by mouth every 12 (twelve) hours. 06/02/22   Leroy Sea, MD  famotidine (PEPCID) 40 MG tablet Take 1 tablet (40 mg total) by mouth daily for 10 days. 05/09/21 05/19/21  Theadora Rama Scales, PA-C  hydrALAZINE (APRESOLINE) 50 MG tablet Take 1 tablet (50 mg total) by mouth every 8 (eight) hours. 06/02/22   Leroy Sea, MD  HYDROcodone-acetaminophen (NORCO) 7.5-325 MG tablet Take 1-2 tablets by mouth 2 (two) times daily as needed for moderate pain. 05/05/22   [provider]  methylPREDNISolone (MEDROL DOSEPAK) 4 MG TBPK tablet Follow package directions. 06/02/22  Leroy Sea, MD  oseltamivir (TAMIFLU) 75 MG capsule Take 1 capsule (75 mg total) by mouth 2 (two) times daily. 06/02/22   Leroy Sea, MD    Family History Family History  Problem Relation Age of Onset   Hypertension Mother    Diabetes Mother    Hypertension Father    Cancer Father     Social History Social History   Tobacco Use   Smoking status: Former    Packs/day: 3.00    Years: 21.00    Additional pack years: 0.00    Total pack years: 63.00    Types: Cigarettes    Quit date: 08/18/2002    Years since quitting: 20.1   Smokeless tobacco: Former  Substance Use Topics    Alcohol use: Yes    Comment: RARE   Drug use: No     Allergies   Penicillins and Sulfa antibiotics   Review of Systems Review of Systems  HENT:  Positive for congestion and sore throat.   Respiratory:  Positive for cough.      Physical Exam Triage Vital Signs ED Triage Vitals [09/23/22 1653]  Enc Vitals Group     BP (!) 148/89     Pulse Rate 95     Resp 20     Temp 98.6 F (37 C)     Temp Source Oral     SpO2 95 %     Weight      Height      Head Circumference      Peak Flow      Pain Score      Pain Loc      Pain Edu?      Excl. in GC?    No data found.  Updated Vital Signs BP (!) 148/89 (BP Location: Left Arm)   Pulse 95   Temp 98.6 F (37 C) (Oral)   Resp 20   SpO2 95%   Visual Acuity Right Eye Distance:   Left Eye Distance:   Bilateral Distance:    Right Eye Near:   Left Eye Near:    Bilateral Near:     Physical Exam Vitals and nursing note reviewed.  Constitutional:      General: He is not in acute distress.    Appearance: Normal appearance. He is not ill-appearing or toxic-appearing.  HENT:     Head: Normocephalic and atraumatic.     Right Ear: Tympanic membrane and ear canal normal.     Left Ear: Tympanic membrane and ear canal normal.     Nose: Congestion present.     Mouth/Throat:     Mouth: Mucous membranes are moist.     Pharynx: Posterior oropharyngeal erythema present.  Eyes:     Pupils: Pupils are equal, round, and reactive to light.  Cardiovascular:     Rate and Rhythm: Normal rate and regular rhythm.     Heart sounds: Normal heart sounds.  Pulmonary:     Effort: Pulmonary effort is normal.     Breath sounds: Normal breath sounds.  Musculoskeletal:     Cervical back: Normal range of motion and neck supple.  Lymphadenopathy:     Cervical: No cervical adenopathy.  Skin:    General: Skin is warm and dry.  Neurological:     General: No focal deficit present.     Mental Status: He is alert and oriented to person, place,  and time.  Psychiatric:        Mood and Affect:  Mood normal.        Behavior: Behavior normal.      UC Treatments / Results  Labs (all labs ordered are listed, but only abnormal results are displayed) Labs Reviewed - No data to display  EKG   Radiology No results found.  Procedures Procedures (including critical care time)  Medications Ordered in UC Medications - No data to display  Initial Impression / Assessment and Plan / UC Course  I have reviewed the triage vital signs and the nursing notes.  Pertinent labs & imaging results that were available during my care of the patient were reviewed by me and considered in my medical decision making (see chart for details).     Reviewed exam and symptoms with patient.  No red flags. He declined COVID testing Discussed viral illness and symptomatic treatment Promethazine DM as needed for cough.  Side effect profile reviewed Patient requested albuterol inhaler, sent to pharmacy PCP follow-up if symptoms do not improve ER precautions reviewed and patient verbalized understanding Final Clinical Impressions(s) / UC Diagnoses   Final diagnoses:  Viral upper respiratory illness     Discharge Instructions      Your symptoms and exam are consistent for a viral illness. Please treat your symptoms with over the counter tylenol or ibuprofen, humidifier, and rest.  Promethazine DM as needed for cough.  Please note this medication can make you drowsy.  Do not drink alcohol or drive while on this medication.  Viral illnesses can last 7-14 days. Please follow up with your PCP if your symptoms are not improving. Please go to the ER for any worsening symptoms. This includes but is not limited to fever you can not control with tylenol or ibuprofen, you are not able to stay hydrated, you have shortness of breath or chest pain.  Thank you for choosing [facility] for your healthcare needs. I hope you feel better soon!]    ED Prescriptions      Medication Sig Dispense Auth. Provider   promethazine-dextromethorphan (PROMETHAZINE-DM) 6.25-15 MG/5ML syrup Take 5 mLs by mouth 4 (four) times daily as needed for cough. 118 mL Radford PaxMayer, Jodi R, NP   albuterol (VENTOLIN HFA) 108 (90 Base) MCG/ACT inhaler Inhale 1-2 puffs into the lungs every 6 (six) hours as needed for wheezing or shortness of breath. 1 each Radford PaxMayer, Jodi R, NP      PDMP not reviewed this encounter.   Radford PaxMayer, Jodi R, NP 09/23/22 1713    Radford PaxMayer, Jodi R, NP 09/23/22 1714

## 2022-09-23 NOTE — ED Triage Notes (Signed)
Patient presents to UC for SOB with activity. Hospitalized with pneumonia in Dec. States productive cough and congestion since this morning. Taking delsym one dose today.   Denies fever.

## 2022-09-23 NOTE — Discharge Instructions (Signed)
Your symptoms and exam are consistent for a viral illness. Please treat your symptoms with over the counter tylenol or ibuprofen, humidifier, and rest.  Promethazine DM as needed for cough.  Please note this medication can make you drowsy.  Do not drink alcohol or drive while on this medication.  Viral illnesses can last 7-14 days. Please follow up with your PCP if your symptoms are not improving. Please go to the ER for any worsening symptoms. This includes but is not limited to fever you can not control with tylenol or ibuprofen, you are not able to stay hydrated, you have shortness of breath or chest pain.  Thank you for choosing [facility] for your healthcare needs. I hope you feel better soon!]

## 2022-11-10 ENCOUNTER — Ambulatory Visit
Admission: EM | Admit: 2022-11-10 | Discharge: 2022-11-10 | Disposition: A | Discharge status: Home / Self Care | Payer: BC Managed Care – PPO

## 2022-11-10 DIAGNOSIS — R32 Unspecified urinary incontinence: Secondary | ICD-10-CM | POA: Diagnosis not present

## 2022-11-10 DIAGNOSIS — R197 Diarrhea, unspecified: Secondary | ICD-10-CM | POA: Diagnosis not present

## 2022-11-10 DIAGNOSIS — R110 Nausea: Secondary | ICD-10-CM

## 2022-11-10 DIAGNOSIS — A084 Viral intestinal infection, unspecified: Secondary | ICD-10-CM

## 2022-11-10 MED ORDER — ONDANSETRON 8 MG PO TBDP
8.0000 mg | ORAL_TABLET | Freq: Once | ORAL | Status: AC
  Administered 2022-11-10: 8 mg via ORAL

## 2022-11-10 MED ORDER — ONDANSETRON 8 MG PO TBDP: 8.0000 mg | ORAL_TABLET | Freq: Three times a day (TID) | ORAL | 0 refills | Status: DC | PRN

## 2022-11-10 MED ORDER — LOPERAMIDE HCL 2 MG PO CAPS: 2.0000 mg | ORAL_CAPSULE | Freq: Two times a day (BID) | ORAL | 0 refills | Status: AC | PRN

## 2022-11-10 NOTE — ED Provider Notes (Signed)
[facility]  Note:  This document was prepared using Conservation officer, historic buildings and may include unintentional dictation errors.  MRN: [id number] DOB: 10-10-1965  Subjective:   Mike Watkins is a 57 y.o. male presenting for 4-day history of persistent fever, chills, nausea without vomiting, diarrhea.  Patient just came back from [place].  Works as a [employer].  No fever, bloody stools, recent antibiotic use, hospitalizations or long distance travel.  Has not eaten raw foods, drank unfiltered water.  No history of GI disorders including Crohn's, IBS, ulcerative colitis.  He has been able to drink water and Gatorade consistently.  No current facility-administered medications for this encounter.  Current Outpatient Medications:    ibuprofen (ADVIL) 200 MG tablet, Take 200 mg by mouth every 6 (six) hours as needed., Disp: , Rfl:    acetaminophen (TYLENOL) 500 MG tablet, Take 1,000 mg by mouth every 6 (six) hours as needed for moderate pain., Disp: , Rfl:    albuterol (VENTOLIN HFA) 108 (90 Base) MCG/ACT inhaler, Inhale 1-2 puffs into the lungs every 6 (six) hours as needed for wheezing or shortness of breath., Disp: 1 each, Rfl: 0   azithromycin (ZITHROMAX) 250 MG tablet, Take 2 tablets by mouth once., Disp: 2 tablet, Rfl: 0   diltiazem (CARDIZEM) 90 MG tablet, Take 1 tablet (90 mg total) by mouth every 12 (twelve) hours., Disp: 60 tablet, Rfl: 0   famotidine (PEPCID) 40 MG tablet, Take 1 tablet (40 mg total) by mouth daily for 10 days., Disp: 10 tablet, Rfl: 0   hydrALAZINE (APRESOLINE) 50 MG tablet, Take 1 tablet (50 mg total) by mouth every 8 (eight) hours., Disp: 90 tablet, Rfl: 0   HYDROcodone-acetaminophen (NORCO) 7.5-325 MG tablet, Take 1-2 tablets by mouth 2 (two) times daily as needed for moderate pain., Disp: , Rfl:    methylPREDNISolone (MEDROL DOSEPAK) 4 MG TBPK tablet, Follow package directions., Disp: 21 tablet, Rfl: 0   oseltamivir (TAMIFLU)  75 MG capsule, Take 1 capsule (75 mg total) by mouth 2 (two) times daily., Disp: 4 capsule, Rfl: 0   promethazine-dextromethorphan (PROMETHAZINE-DM) 6.25-15 MG/5ML syrup, Take 5 mLs by mouth 4 (four) times daily as needed for cough., Disp: 118 mL, Rfl: 0   Allergies  Allergen Reactions   Penicillins Shortness Of Breath    Has patient had a PCN reaction causing immediate rash, facial/tongue/throat swelling, SOB or lightheadedness with hypotension: Yes Has patient had a PCN reaction causing severe rash involving mucus membranes or skin necrosis: No Has patient had a PCN reaction that required hospitalization No Has patient had a PCN reaction occurring within the last 10 years: No If all of the above answers are "NO", then may proceed with Cephalosporin use.     Sulfa Antibiotics Shortness Of Breath    "Hard to breathe."    Past Medical History:  Diagnosis Date   Arthritis    Hypertension    Penile lesion      Past Surgical History:  Procedure Laterality Date   ANTERIOR CERVICAL DECOMP/DISCECTOMY FUSION  12-31-2002   C5 -- C7   CYST EXCISION     pain in r/hand-middle finger   HAND SURGERY     PENILE BIOPSY N/A 08/23/2012   Procedure: EXCISIONAL BIOPSY OF PENILE LESION;  Surgeon: Milford Cage, MD;  Location: [facility];  Service: Urology;  Laterality: N/A;  EXCISIONAL BIOPSY OF PENILE LESION     POSTERIOR FUSION CERVICAL SPINE  02-26-2005  C5 -- C7    Family History  Problem Relation Age of Onset   Hypertension Mother    Diabetes Mother    Hypertension Father    Cancer Father     Social History   Tobacco Use   Smoking status: Former    Packs/day: 3.00    Years: 21.00    Additional pack years: 0.00    Total pack years: 63.00    Types: Cigarettes    Quit date: 08/18/2002    Years since quitting: 20.2   Smokeless tobacco: Former  Substance Use Topics   Alcohol use: Yes    Comment: RARE   Drug use: No    ROS   Objective:    Vitals: BP (!) 162/83 (BP Location: Left Arm)   Pulse 95   Temp 98.1 F (36.7 C) (Oral)   Resp (!) 22   SpO2 93%   Pulse oximetry rechecked and was 98% on room air.  Physical Exam Constitutional:      General: He is not in acute distress.    Appearance: Normal appearance. He is well-developed and normal weight. He is not ill-appearing, toxic-appearing or diaphoretic.  HENT:     Head: Normocephalic and atraumatic.     Right Ear: External ear normal.     Left Ear: External ear normal.     Nose: Nose normal.     Mouth/Throat:     Mouth: Mucous membranes are moist.  Eyes:     General: No scleral icterus.       Right eye: No discharge.        Left eye: No discharge.     Extraocular Movements: Extraocular movements intact.  Cardiovascular:     Rate and Rhythm: Normal rate and regular rhythm.     Heart sounds: Normal heart sounds. No murmur heard.    No friction rub. No gallop.  Pulmonary:     Effort: Pulmonary effort is normal. No respiratory distress.     Breath sounds: Normal breath sounds. No stridor. No wheezing, rhonchi or rales.  Abdominal:     General: Bowel sounds are increased. There is no distension.     Palpations: Abdomen is soft. There is no mass.     Tenderness: There is no abdominal tenderness. There is no right CVA tenderness, left CVA tenderness, guarding or rebound.  Musculoskeletal:     Cervical back: Normal range of motion.  Neurological:     Mental Status: He is alert and oriented to person, place, and time.  Psychiatric:        Mood and Affect: Mood normal.        Behavior: Behavior normal.        Thought Content: Thought content normal.        Judgment: Judgment normal.     Assessment and Plan :   PDMP not reviewed this encounter.  1. Viral gastroenteritis   2. Nausea without vomiting   3. Diarrhea, unspecified type   4. Urinary incontinence, unspecified type    No signs of an acute abdomen.  Will manage for suspected viral  gastroenteritis with supportive care.  Recommended patient hydrate well, eat light meals and maintain electrolytes.  Will use Zofran and Imodium for nausea, vomiting and diarrhea. Counseled patient on potential for adverse effects with medications prescribed/recommended today, ER and return-to-clinic precautions discussed, patient verbalized understanding.    Wallis Bamberg, [place] 11/10/22 1958

## 2022-11-10 NOTE — Discharge Instructions (Addendum)
Make sure you push fluids drinking mostly water but mix it with Gatorade.  Try to eat light meals including soups, broths and soft foods, fruits.  You may use Zofran for your nausea and vomiting once every 8 hours.  Imodium can help with diarrhea but use this carefully limiting it to 1-2 times per day only if you are having a lot of diarrhea.  Please return to the clinic if symptoms worsen or you start having severe abdominal pain not helped by taking Tylenol or start having bloody stools or blood in the vomit.  Follow up with a specialist from urology for the urinary leakage.

## 2022-11-10 NOTE — ED Triage Notes (Signed)
Pt reports fever, chills and nausea x 4 days. Ibuprofen gives relief. Denies nausea at this moment.

## 2022-11-14 ENCOUNTER — Encounter (HOSPITAL_COMMUNITY): Payer: Self-pay

## 2022-11-14 ENCOUNTER — Ambulatory Visit (HOSPITAL_COMMUNITY)
Admission: EM | Admit: 2022-11-14 | Discharge: 2022-11-14 | Disposition: A | Discharge status: Home / Self Care | Payer: BC Managed Care – PPO

## 2022-11-14 ENCOUNTER — Ambulatory Visit (INDEPENDENT_AMBULATORY_CARE_PROVIDER_SITE_OTHER): Payer: BC Managed Care – PPO

## 2022-11-14 DIAGNOSIS — J441 Chronic obstructive pulmonary disease with (acute) exacerbation: Secondary | ICD-10-CM

## 2022-11-14 DIAGNOSIS — Z87891 Personal history of nicotine dependence: Secondary | ICD-10-CM | POA: Diagnosis not present

## 2022-11-14 DIAGNOSIS — Z9109 Other allergy status, other than to drugs and biological substances: Secondary | ICD-10-CM

## 2022-11-14 DIAGNOSIS — H66001 Acute suppurative otitis media without spontaneous rupture of ear drum, right ear: Secondary | ICD-10-CM

## 2022-11-14 MED ORDER — AEROCHAMBER PLUS FLO-VU LARGE MISC: 1.0000 | Freq: Once | 0 refills | Status: AC

## 2022-11-14 MED ORDER — METHYLPREDNISOLONE 4 MG PO TBPK: ORAL_TABLET | ORAL | 0 refills | Status: DC

## 2022-11-14 MED ORDER — AZITHROMYCIN 250 MG PO TABS: ORAL_TABLET | ORAL | 0 refills | Status: AC

## 2022-11-14 MED ORDER — BREZTRI AEROSPHERE 160-9-4.8 MCG/ACT IN AERO: 2.0000 | INHALATION_SPRAY | Freq: Two times a day (BID) | RESPIRATORY_TRACT | 0 refills | Status: AC

## 2022-11-14 MED ORDER — ALBUTEROL SULFATE (2.5 MG/3ML) 0.083% IN NEBU
INHALATION_SOLUTION | RESPIRATORY_TRACT | Status: AC
  Filled 2022-11-14: qty 3

## 2022-11-14 MED ORDER — MOMETASONE FUROATE 50 MCG/ACT NA SUSP: 2.0000 | Freq: Every day | NASAL | 2 refills | Status: DC

## 2022-11-14 MED ORDER — ALBUTEROL SULFATE HFA 108 (90 BASE) MCG/ACT IN AERS: 2.0000 | INHALATION_SPRAY | Freq: Four times a day (QID) | RESPIRATORY_TRACT | 2 refills | Status: DC | PRN

## 2022-11-14 MED ORDER — LEVOCETIRIZINE DIHYDROCHLORIDE 5 MG PO TABS: 5.0000 mg | ORAL_TABLET | Freq: Every evening | ORAL | 1 refills | Status: DC

## 2022-11-14 MED ORDER — ALBUTEROL SULFATE (2.5 MG/3ML) 0.083% IN NEBU
2.5000 mg | INHALATION_SOLUTION | Freq: Once | RESPIRATORY_TRACT | Status: AC
  Administered 2022-11-14: 2.5 mg via RESPIRATORY_TRACT

## 2022-11-14 NOTE — Discharge Instructions (Addendum)
To treat the infection in your right inner ear and the flare up of your asthma, also often referred to as COPD as we get older, please read below to learn more about the medications, dosages and frequencies that I recommend to help alleviate your symptoms and to get you feeling better soon:   Z-Pak (azithromycin):  Please take two (2) tablets on day one and one tablet daily thereafter until the prescription is complete.This antibiotic can cause upset stomach, this will resolve once antibiotics are complete.  You are welcome to take a probiotic, eat yogurt, take Imodium while taking this medication.  Please avoid other systemic medications such as Maalox, Pepto-Bismol or milk of magnesia as they can interfere with the body's ability to absorb the antibiotics.  Medrol Dosepak (methylprednisolone): This is a steroid that will significantly calm your upper and lower airways.  Please take one row of tablets daily with your breakfast meal starting tomorrow morning until the prescription is complete.          Xyzal (levocetirizine): This is an excellent second-generation antihistamine that helps to reduce respiratory inflammatory response to environmental allergens and keeps your lungs calm.  In some patients, this medication can cause daytime sleepiness so I recommend that you take 1 tablet daily at bedtime.     Nasonex (mometasone): This is a steroid nasal spray that used once daily, 1 spray in each nare.  This works best when used on a daily basis. This medication does not work well if it is only used when you think you need it.  After 3 to 5 days of use, you will notice significant reduction of the inflammation and mucus production that is currently being caused by exposure to allergens, whether seasonal or environmental.  The most common side effect of this medication is nosebleeds.  If you experience a nosebleed, please discontinue use for 1 week, then feel free to resume.   If you find that your insurance  will not pay for this medication, please consider a different nasal steroids such as Flonase (fluticasone), or Nasacort (triamcinolone).    ProAir, Ventolin, Proventil (albuterol): This inhaled RESCUE medication contains a short acting beta agonist bronchodilator.  This medication works on the smooth muscle that opens and constricts of your airways by relaxing the muscle.  The result of relaxation of the smooth muscle is increased air movement and improved work of breathing.  This is a short acting medication that can be used every 4-6 hours as needed for increased work of breathing, shortness of breath, wheezing and excessive coughing.    Combivent, DuoNeb (ipratropium and albuterol): This inhaled medication contains a short acting beta agonist bronchodilator and a muscarinic bronchodilator.  This medication works on the smooth muscle that opens and constricts of your airways by relaxing the muscle.  The result of relaxation of the smooth muscle is increased air movement and improved work of breathing.  This is a short acting medication that can be used every 4-6 hours as needed for increased work of breathing, shortness of breath, wheezing and excessive coughing.     Breztri (budesonide, glycopyrrolate and formoterol fumarate):  This daily inhaled, MAINTENANCE medication contains an inhaled corticosteroid (budesonide), a muscarinic bronchodilator (glycopyrrolate) and a long-acting form of albuterol (formoterol fumarate).  The inhaled steroid and this medication  is not absorbed into the body and will not cause side effects such as increased blood sugar levels, irritability, sleeplessness or weight gain.  Inhaled corticosteroids are sort of like topical steroid creams  but, as you can imagine, it is not practical to attempt to rub a steroid cream inside of your lungs.  The long-acting albuterol and muscarinic bronchodilator works similarly to the short acting albuterol and Spiriva (ipratropium) that are found  in rescue inhalers.  They provide 24-hour relaxation of the smooth muscles that open and constrict your airways.  Short acting rescue inhalers can only provide this benefit for a few hours.  Please feel free to continue using albuterol (your short acting rescue inhaler) as often as needed throughout the day for shortness of breath, wheezing, and cough.   If symptoms have not meaningfully improved in the next 5 to 7 days, please return for repeat evaluation or follow-up with your regular provider.  If symptoms have worsened in the next 3 to 5 days, please return for repeat evaluation or follow-up with your regular provider.    Thank you for visiting urgent care today.  We appreciate the opportunity to participate in your care.

## 2022-11-14 NOTE — ED Triage Notes (Addendum)
Pt reports SOB; since December. Pt stated he was diagnosis with Pneumonia several month ago. Pt stated he does not have an inhaler. Pt is requesting steroids.

## 2022-11-14 NOTE — ED Provider Notes (Signed)
[facility]    CSN: [id number] Arrival date & time: 11/14/22  1005    HISTORY   Chief Complaint  Patient presents with   Shortness of Breath   HPI HRISHI EKSTROM is a pleasant, 57 y.o. male who presents to urgent care today. Patient complains of shortness of breath for the past several months, reports having had pneumonia due to influenza A in December, he was admitted to the hospital for this.  Patient reports a remote history of asthma and allergies, currently does not have an inhaler or take any allergy medications.  Patient was seen at another urgent care 3 days ago for nausea, vomiting and diarrhea, treated with Imodium and Zofran.  Patient works as a [employer].  Patient reports current use of alcohol, states he quit smoking cigarettes in March of this year.  On arrival, patient had an O2 sat of 93% which improved with deep breathing to 97%, then returned to 93%.  Patient denies fever, body aches, chills, sinus pain or pressure, nasal congestion, nasal drainage, otalgia, sore throat, nausea, vomiting, diarrhea, known sick contacts.  The history is provided by the patient.   Past Medical History:  Diagnosis Date   Arthritis    Hypertension    Penile lesion    Patient Active Problem List   Diagnosis Date Noted   Hypokalemia 05/30/2022   Obesity (BMI 30-39.9) 05/30/2022   Acute respiratory failure with hypoxia (HCC) 05/29/2022   Influenza A with pneumonia 05/29/2022   Essential hypertension 05/29/2022   Right upper extremity numbness 08/14/2018   Chronic pain of left knee 02/07/2017   Fall -multiple falls in the past year. 08/29/2016   Acute pain of right knee 08/29/2016   Contusion of right hand 07/14/2016   Ganglion of flexor tendon sheath of right middle finger 06/02/2016   Impingement syndrome of left shoulder 04/20/2016   Past Surgical History:  Procedure Laterality Date   ANTERIOR CERVICAL DECOMP/DISCECTOMY FUSION  12-31-2002   C5 -- C7   CYST  EXCISION     pain in r/hand-middle finger   HAND SURGERY     PENILE BIOPSY N/A 08/23/2012   Procedure: EXCISIONAL BIOPSY OF PENILE LESION;  Surgeon: Milford Cage, MD;  Location: [facility];  Service: Urology;  Laterality: N/A;  EXCISIONAL BIOPSY OF PENILE LESION     POSTERIOR FUSION CERVICAL SPINE  02-26-2005   C5 -- C7    Home Medications    Prior to Admission medications   Medication Sig Start Date End Date Taking? Authorizing Provider  amoxicillin (AMOXIL) 500 MG tablet Take by mouth. 07/12/22  Yes [provider]  cyclobenzaprine (FLEXERIL) 10 MG tablet Take 10 mg by mouth 2 (two) times daily as needed. 07/05/22  Yes [provider]  acetaminophen (TYLENOL) 500 MG tablet Take 1,000 mg by mouth every 6 (six) hours as needed for moderate pain.    [provider]  albuterol (VENTOLIN HFA) 108 (90 Base) MCG/ACT inhaler Inhale 1-2 puffs into the lungs every 6 (six) hours as needed for wheezing or shortness of breath. 09/23/22   Radford Pax, NP  azithromycin (ZITHROMAX) 250 MG tablet Take 2 tablets by mouth once. 06/02/22   Leroy Sea, MD  diltiazem (CARDIZEM) 90 MG tablet Take 1 tablet (90 mg total) by mouth every 12 (twelve) hours. 06/02/22   Leroy Sea, MD  famotidine (PEPCID) 40 MG tablet Take 1 tablet (40 mg total) by mouth daily for 10 days. 05/09/21 05/19/21  Theadora Rama Scales, PA-C  hydrALAZINE (APRESOLINE) 50 MG tablet Take 1 tablet (50 mg total) by mouth every 8 (eight) hours. 06/02/22   Leroy Sea, MD  HYDROcodone-acetaminophen (NORCO) 7.5-325 MG tablet Take 1-2 tablets by mouth 2 (two) times daily as needed for moderate pain. 05/05/22   [provider]  ibuprofen (ADVIL) 200 MG tablet Take 200 mg by mouth every 6 (six) hours as needed.    [provider]  loperamide (IMODIUM) 2 MG capsule Take 1 capsule (2 mg total) by mouth 2 (two) times daily as needed for diarrhea or loose stools.  11/10/22   Wallis Bamberg, PA-C  methylPREDNISolone (MEDROL DOSEPAK) 4 MG TBPK tablet Follow package directions. 06/02/22   Leroy Sea, MD  ondansetron (ZOFRAN-ODT) 8 MG disintegrating tablet Take 1 tablet (8 mg total) by mouth every 8 (eight) hours as needed for nausea or vomiting. 11/10/22   Wallis Bamberg, PA-C  oseltamivir (TAMIFLU) 75 MG capsule Take 1 capsule (75 mg total) by mouth 2 (two) times daily. 06/02/22   Leroy Sea, MD  promethazine-dextromethorphan (PROMETHAZINE-DM) 6.25-15 MG/5ML syrup Take 5 mLs by mouth 4 (four) times daily as needed for cough. 09/23/22   Radford Pax, NP    Family History Family History  Problem Relation Age of Onset   Hypertension Mother    Diabetes Mother    Hypertension Father    Cancer Father    Social History Social History   Tobacco Use   Smoking status: Former    Packs/day: 3.00    Years: 21.00    Additional pack years: 0.00    Total pack years: 63.00    Types: Cigarettes    Quit date: 08/18/2002    Years since quitting: 20.2   Smokeless tobacco: Former  Substance Use Topics   Alcohol use: Yes    Comment: RARE   Drug use: No   Allergies   Penicillins and Sulfa antibiotics  Review of Systems Review of Systems Pertinent findings revealed after performing a 14 point review of systems has been noted in the history of present illness.  Physical Exam Vital Signs BP 139/79 (BP Location: Left Arm)   Pulse 63   Temp 98.3 F (36.8 C) (Oral)   Resp 18   SpO2 93%   No data found.  Physical Exam Vitals and nursing note reviewed.  Constitutional:      General: He is awake. He is not in acute distress.    Appearance: Normal appearance. He is well-developed and well-groomed. He is obese. He is not ill-appearing, toxic-appearing or diaphoretic.  HENT:     Head: Normocephalic and atraumatic.     Salivary Glands: Right salivary gland is not diffusely enlarged or tender. Left salivary gland is not diffusely enlarged or tender.      Right Ear: Hearing and external ear normal. No drainage. A middle ear effusion (Suppurative) is present. There is no impacted cerumen. Tympanic membrane is injected, erythematous and bulging.     Left Ear: Hearing, ear canal and external ear normal. No drainage. A middle ear effusion (Serous) is present. There is no impacted cerumen. Tympanic membrane is bulging. Tympanic membrane is not erythematous.     Ears:     Comments: Right EAC diffuse with erythema    Nose: Nose normal. No nasal deformity, septal deviation, mucosal edema, congestion or rhinorrhea.     Right Turbinates: Enlarged and swollen. Not pale.     Left Turbinates: Enlarged and swollen. Not pale.  Right Sinus: No maxillary sinus tenderness or frontal sinus tenderness.     Left Sinus: No maxillary sinus tenderness or frontal sinus tenderness.     Mouth/Throat:     Lips: Pink. No lesions.     Mouth: Mucous membranes are moist. No oral lesions.     Pharynx: Oropharynx is clear. Uvula midline. No pharyngeal swelling, oropharyngeal exudate, posterior oropharyngeal erythema or uvula swelling.     Tonsils: No tonsillar exudate. 0 on the right. 0 on the left.     Comments: Cobblestoning of posterior pharynx Eyes:     General: Lids are normal.        Right eye: No discharge.        Left eye: No discharge.     Extraocular Movements: Extraocular movements intact.     Conjunctiva/sclera: Conjunctivae normal.     Right eye: Right conjunctiva is not injected.     Left eye: Left conjunctiva is not injected.     Pupils: Pupils are equal, round, and reactive to light.  Neck:     Trachea: Trachea and phonation normal.  Cardiovascular:     Rate and Rhythm: Normal rate and regular rhythm.     Pulses: Normal pulses.     Heart sounds: Normal heart sounds. No murmur heard.    No friction rub. No gallop.  Pulmonary:     Effort: Pulmonary effort is normal. No tachypnea, bradypnea, accessory muscle usage, prolonged expiration or respiratory  distress.     Breath sounds: No stridor, decreased air movement or transmitted upper airway sounds. Examination of the right-upper field reveals decreased breath sounds. Examination of the right-middle field reveals decreased breath sounds and rales. Examination of the right-lower field reveals rales. Examination of the left-lower field reveals rales. Decreased breath sounds and rales present. No wheezing or rhonchi.  Chest:     Chest wall: No tenderness.  Musculoskeletal:        General: Normal range of motion.     Cervical back: Full passive range of motion without pain, normal range of motion and neck supple. Normal range of motion.  Lymphadenopathy:     Cervical: Cervical adenopathy present.     Right cervical: Superficial cervical adenopathy and posterior cervical adenopathy present.  Skin:    General: Skin is warm and dry.     Findings: No erythema or rash.  Neurological:     General: No focal deficit present.     Mental Status: He is alert and oriented to person, place, and time.  Psychiatric:        Mood and Affect: Mood normal.        Behavior: Behavior normal. Behavior is cooperative.     Visual Acuity Right Eye Distance:   Left Eye Distance:   Bilateral Distance:    Right Eye Near:   Left Eye Near:    Bilateral Near:     [facility] Couse / Diagnostics / Procedures:     Radiology DG Chest 2 View  Result Date: 11/14/2022 CLINICAL DATA:  Short of breath. Rales in both lung bases. Diagnosed with pneumonia several months ago. EXAM: CHEST - 2 VIEW COMPARISON:  05/30/2022. FINDINGS: Cardiac silhouette is normal in size and configuration. No mediastinal or hilar masses. No evidence of adenopathy. Clear lungs.  No pleural effusion or pneumothorax. Skeletal structures are intact. IMPRESSION: No active cardiopulmonary disease. Electronically Signed   By: Amie Portland M.D.   On: 11/14/2022 11:28    Procedures Procedures (including critical care time) EKG  Pending results:  Labs  Reviewed - No data to display  Medications Ordered in UC: Medications  albuterol (PROVENTIL) (2.5 MG/3ML) 0.083% nebulizer solution 2.5 mg (2.5 mg Nebulization Given 11/14/22 1057)    UC Diagnoses / Final Clinical Impressions(s)   I have reviewed the triage vital signs and the nursing notes.  Pertinent labs & imaging results that were available during my care of the patient were reviewed by me and considered in my medical decision making (see chart for details).    Final diagnoses:  COPD exacerbation (HCC)  Acute suppurative otitis media of right ear without spontaneous rupture of tympanic membrane, recurrence not specified  History of environmental allergies  History of cigarette smoking  Repeat auscultation post nebulized bronchodilator revealed improved work of breathing but continued crackles in right middle and bilateral lower lung fields.    Patient advised of x-ray findings not concerning for frank pneumonia.  Given presence of suppurative otitis media in his right ear, history of cigarette smoking and likely COPD at this time due to frequent exacerbations, recommend patient complete a 5-day course of azithromycin.  Patient advised to continue second-generation antihistamine and nasal steroid spray.  Patient provided with history for triple treatment of presumed COPD until he can follow-up with his PCP as well as a tapering dose of methylprednisolone to calm respiratory inflammation.  Patient also provided with albuterol to inhale 2 puffs up to 4 times daily as needed for shortness of breath, cough, wheezing.  Conservative care recommended.  Return precautions advised.    Please see discharge instructions below for details of plan of care as provided to patient. ED Prescriptions     Medication Sig Dispense Auth. Provider   levocetirizine (XYZAL) 5 MG tablet Take 1 tablet (5 mg total) by mouth every evening. 90 tablet Theadora Rama Scales, PA-C   albuterol (VENTOLIN HFA) 108 (90  Base) MCG/ACT inhaler Inhale 2 puffs into the lungs every 6 (six) hours as needed for wheezing or shortness of breath (Cough). 18 g Theadora Rama Scales, PA-C   Spacer/Aero-Holding Chambers (AEROCHAMBER PLUS FLO-VU LARGE) MISC 1 each by Other route once for 1 dose. 1 each Theadora Rama Scales, PA-C   Budeson-Glycopyrrol-Formoterol (BREZTRI AEROSPHERE) 160-9-4.8 MCG/ACT AERO Inhale 2 puffs into the lungs in the morning and at bedtime. 3 each Theadora Rama Scales, PA-C   mometasone (NASONEX) 50 MCG/ACT nasal spray Place 2 sprays into the nose daily. 1 each Theadora Rama Scales, PA-C   azithromycin (ZITHROMAX) 250 MG tablet Take 2 tablets (500 mg total) by mouth daily for 1 day, THEN 1 tablet (250 mg total) daily for 4 days. 6 tablet Theadora Rama Scales, PA-C   methylPREDNISolone (MEDROL DOSEPAK) 4 MG TBPK tablet Take 24 mg on day 1, 20 mg on day 2, 16 mg on day 3, 12 mg on day 4, 8 mg on day 5, 4 mg on day 6.  Take all tablets in each row at once, do not spread tablets out throughout the day. 21 tablet Theadora Rama Scales, PA-C      PDMP not reviewed this encounter.  Pending results:  Labs Reviewed - No data to display  Discharge Instructions:   Discharge Instructions      To treat the infection in your right inner ear and the flare up of your asthma, also often referred to as COPD as we get older, please read below to learn more about the medications, dosages and frequencies that I recommend to help alleviate your symptoms and to get  you feeling better soon:   Z-Pak (azithromycin):  Please take two (2) tablets on day one and one tablet daily thereafter until the prescription is complete.This antibiotic can cause upset stomach, this will resolve once antibiotics are complete.  You are welcome to take a probiotic, eat yogurt, take Imodium while taking this medication.  Please avoid other systemic medications such as Maalox, Pepto-Bismol or milk of magnesia as they can interfere with  the body's ability to absorb the antibiotics.  Medrol Dosepak (methylprednisolone): This is a steroid that will significantly calm your upper and lower airways.  Please take one row of tablets daily with your breakfast meal starting tomorrow morning until the prescription is complete.          Xyzal (levocetirizine): This is an excellent second-generation antihistamine that helps to reduce respiratory inflammatory response to environmental allergens and keeps your lungs calm.  In some patients, this medication can cause daytime sleepiness so I recommend that you take 1 tablet daily at bedtime.     Nasonex (mometasone): This is a steroid nasal spray that used once daily, 1 spray in each nare.  This works best when used on a daily basis. This medication does not work well if it is only used when you think you need it.  After 3 to 5 days of use, you will notice significant reduction of the inflammation and mucus production that is currently being caused by exposure to allergens, whether seasonal or environmental.  The most common side effect of this medication is nosebleeds.  If you experience a nosebleed, please discontinue use for 1 week, then feel free to resume.   If you find that your insurance will not pay for this medication, please consider a different nasal steroids such as Flonase (fluticasone), or Nasacort (triamcinolone).    ProAir, Ventolin, Proventil (albuterol): This inhaled RESCUE medication contains a short acting beta agonist bronchodilator.  This medication works on the smooth muscle that opens and constricts of your airways by relaxing the muscle.  The result of relaxation of the smooth muscle is increased air movement and improved work of breathing.  This is a short acting medication that can be used every 4-6 hours as needed for increased work of breathing, shortness of breath, wheezing and excessive coughing.    Combivent, DuoNeb (ipratropium and albuterol): This inhaled medication  contains a short acting beta agonist bronchodilator and a muscarinic bronchodilator.  This medication works on the smooth muscle that opens and constricts of your airways by relaxing the muscle.  The result of relaxation of the smooth muscle is increased air movement and improved work of breathing.  This is a short acting medication that can be used every 4-6 hours as needed for increased work of breathing, shortness of breath, wheezing and excessive coughing.     Breztri (budesonide, glycopyrrolate and formoterol fumarate):  This daily inhaled, MAINTENANCE medication contains an inhaled corticosteroid (budesonide), a muscarinic bronchodilator (glycopyrrolate) and a long-acting form of albuterol (formoterol fumarate).  The inhaled steroid and this medication  is not absorbed into the body and will not cause side effects such as increased blood sugar levels, irritability, sleeplessness or weight gain.  Inhaled corticosteroids are sort of like topical steroid creams but, as you can imagine, it is not practical to attempt to rub a steroid cream inside of your lungs.  The long-acting albuterol and muscarinic bronchodilator works similarly to the short acting albuterol and Spiriva (ipratropium) that are found in rescue inhalers.  They provide 24-hour relaxation  of the smooth muscles that open and constrict your airways.  Short acting rescue inhalers can only provide this benefit for a few hours.  Please feel free to continue using albuterol (your short acting rescue inhaler) as often as needed throughout the day for shortness of breath, wheezing, and cough.   If symptoms have not meaningfully improved in the next 5 to 7 days, please return for repeat evaluation or follow-up with your regular provider.  If symptoms have worsened in the next 3 to 5 days, please return for repeat evaluation or follow-up with your regular provider.    Thank you for visiting urgent care today.  We appreciate the opportunity to  participate in your care.       Disposition Upon Discharge:  Condition: stable for discharge home  Patient presented with an acute illness with associated systemic symptoms and significant discomfort requiring urgent management. In my opinion, this is a condition that a prudent lay person (someone who possesses an average knowledge of health and medicine) may potentially expect to result in complications if not addressed urgently such as respiratory distress, impairment of bodily function or dysfunction of bodily organs.   Routine symptom specific, illness specific and/or disease specific instructions were discussed with the patient and/or caregiver at length.   As such, the patient has been evaluated and assessed, work-up was performed and treatment was provided in alignment with urgent care protocols and evidence based medicine.  Patient/parent/caregiver has been advised that the patient may require follow up for further testing and treatment if the symptoms continue in spite of treatment, as clinically indicated and appropriate.  Patient/parent/caregiver has been advised to return to the [facility] or PCP if no better; to PCP or the Emergency Department if new signs and symptoms develop, or if the current signs or symptoms continue to change or worsen for further workup, evaluation and treatment as clinically indicated and appropriate  The patient will follow up with their current PCP if and as advised. If the patient does not currently have a PCP we will assist them in obtaining one.   The patient may need specialty follow up if the symptoms continue, in spite of conservative treatment and management, for further workup, evaluation, consultation and treatment as clinically indicated and appropriate.  Patient/parent/caregiver verbalized understanding and agreement of plan as discussed.  All questions were addressed during visit.  Please see discharge instructions below for further details of  plan.  This office note has been dictated using Teaching laboratory technician.  Unfortunately, this method of dictation can sometimes lead to typographical or grammatical errors.  I apologize for your inconvenience in advance if this occurs.  Please do not hesitate to reach out to me if clarification is needed.      Theadora Rama Scales, PA-C 11/15/22 1150

## 2023-04-23 ENCOUNTER — Ambulatory Visit (HOSPITAL_COMMUNITY)
Admission: EM | Admit: 2023-04-23 | Discharge: 2023-04-23 | Disposition: A | Discharge status: Home / Self Care | Payer: BC Managed Care – PPO | Attending: Emergency Medicine | Admitting: Emergency Medicine

## 2023-04-23 ENCOUNTER — Encounter (HOSPITAL_COMMUNITY): Payer: Self-pay

## 2023-04-23 DIAGNOSIS — L02412 Cutaneous abscess of left axilla: Secondary | ICD-10-CM | POA: Diagnosis not present

## 2023-04-23 MED ORDER — DOXYCYCLINE HYCLATE 100 MG PO CAPS: 100.0000 mg | ORAL_CAPSULE | Freq: Two times a day (BID) | ORAL | 0 refills | Status: DC

## 2023-04-23 NOTE — Discharge Instructions (Addendum)
Please continue to do warm compresses to the arm pit 3 times daily with warm water and antibacterial solution like Dial or Hibiclens.  If you have any pain you can take ibuprofen as needed.  Take the antibiotics twice daily with food.  Your symptoms should improve gradually with the antibiotics, if no improvement please return to clinic as he may be a candidate for incision and drainage.  I suggest not wearing any deodorant to this affected armpit as you have an open draining abscess.

## 2023-04-23 NOTE — ED Triage Notes (Signed)
Boils under arm poss infection. Noticed by Patient when [place] the beach beginning of last [place]. States one came to a head and his wife popped it. States it came back and now there is another one.

## 2023-04-23 NOTE — ED Provider Notes (Signed)
[facility]    CSN: [id number] Arrival date & time: 04/23/23  1348      History   Chief Complaint Chief Complaint  Patient presents with   Recurrent Skin Infections    HPI Mike Watkins is a 57 y.o. male.   Patient presents to clinic for boils under his left armpit.  He noticed these when he was [place] the beach at the beginning of [place].  One of the areas came to ahead and his wife popped it and got out some purulent bloody drainage.  Since then it is came back and there are 2 others.  The areas are not tender unless he hits them.  He has not taken anything for pain.  Denies fevers.  No history of abscesses to his axilla, no history of incision and drainage.  Reports his blood pressure is always elevated, he ate chips prior to arrival.  He was working out in [place] in the community over the past few weeks and sweating a lot.    The history is provided by the patient and medical records.    Past Medical History:  Diagnosis Date   Arthritis    Hypertension    Penile lesion     Patient Active Problem List   Diagnosis Date Noted   Hypokalemia 05/30/2022   Obesity (BMI 30-39.9) 05/30/2022   Acute respiratory failure with hypoxia (HCC) 05/29/2022   Influenza A with pneumonia 05/29/2022   Essential hypertension 05/29/2022   Right upper extremity numbness 08/14/2018   Chronic pain of left knee 02/07/2017   Fall -multiple falls in the past year. 08/29/2016   Acute pain of right knee 08/29/2016   Contusion of right hand 07/14/2016   Ganglion of flexor tendon sheath of right middle finger 06/02/2016   Impingement syndrome of left shoulder 04/20/2016    Past Surgical History:  Procedure Laterality Date   ANTERIOR CERVICAL DECOMP/DISCECTOMY FUSION  12-31-2002   C5 -- C7   CYST EXCISION     pain in r/hand-middle finger   HAND SURGERY     PENILE BIOPSY N/A 08/23/2012   Procedure: EXCISIONAL BIOPSY OF PENILE LESION;  Surgeon: Milford Cage, MD;   Location: [facility];  Service: Urology;  Laterality: N/A;  EXCISIONAL BIOPSY OF PENILE LESION     POSTERIOR FUSION CERVICAL SPINE  02-26-2005   C5 -- C7       Home Medications    Prior to Admission medications   Medication Sig Start Date End Date Taking? Authorizing Provider  albuterol (VENTOLIN HFA) 108 (90 Base) MCG/ACT inhaler Inhale 2 puffs into the lungs every 6 (six) hours as needed for wheezing or shortness of breath (Cough). 11/14/22  Yes Theadora Rama Scales, PA-C  diltiazem (CARDIZEM) 90 MG tablet Take 1 tablet (90 mg total) by mouth every 12 (twelve) hours. 06/02/22  Yes Leroy Sea, MD  doxycycline (VIBRAMYCIN) 100 MG capsule Take 1 capsule (100 mg total) by mouth 2 (two) times daily. 04/23/23  Yes Rinaldo Ratel, [place], FNP  famotidine (PEPCID) 40 MG tablet Take 1 tablet (40 mg total) by mouth daily for 10 days. 05/09/21 04/23/23 Yes Theadora Rama Scales, PA-C  levocetirizine (XYZAL) 5 MG tablet Take 1 tablet (5 mg total) by mouth every evening. 11/14/22 05/13/23 Yes Theadora Rama Scales, PA-C  ibuprofen (ADVIL) 200 MG tablet Take 200 mg by mouth every 6 (six) hours as needed.    [provider]  loperamide (IMODIUM) 2 MG capsule Take 1 capsule (2  mg total) by mouth 2 (two) times daily as needed for diarrhea or loose stools. 11/10/22   Wallis Bamberg, PA-C  methylPREDNISolone (MEDROL DOSEPAK) 4 MG TBPK tablet Take 24 mg on day 1, 20 mg on day 2, 16 mg on day 3, 12 mg on day 4, 8 mg on day 5, 4 mg on day 6.  Take all tablets in each row at once, do not spread tablets out throughout the day. 11/14/22   Theadora Rama Scales, PA-C  mometasone (NASONEX) 50 MCG/ACT nasal spray Place 2 sprays into the nose daily. 11/14/22 02/12/23  Theadora Rama Scales, PA-C  ondansetron (ZOFRAN-ODT) 8 MG disintegrating tablet Take 1 tablet (8 mg total) by mouth every 8 (eight) hours as needed for nausea or vomiting. 11/10/22   Wallis Bamberg, PA-C    Family History Family  History  Problem Relation Age of Onset   Hypertension Mother    Diabetes Mother    Hypertension Father    Cancer Father     Social History Social History   Tobacco Use   Smoking status: Former    Current packs/day: 0.00    Average packs/day: 3.0 packs/day for 21.0 years (63.0 ttl pk-yrs)    Types: Cigarettes    Start date: 08/17/1981    Quit date: 08/18/2002    Years since quitting: 20.6   Smokeless tobacco: Former  Substance Use Topics   Alcohol use: Yes    Comment: RARE   Drug use: No     Allergies   Penicillins and Sulfa antibiotics   Review of Systems Review of Systems  Per HPI   Physical Exam Triage Vital Signs ED Triage Vitals  Encounter Vitals Group     BP 04/23/23 1403 (!) 170/79     Systolic BP Percentile --      Diastolic BP Percentile --      Pulse Rate 04/23/23 1403 91     Resp 04/23/23 1403 18     Temp 04/23/23 1403 97.6 F (36.4 C)     Temp Source 04/23/23 1403 Oral     SpO2 04/23/23 1403 94 %     Weight 04/23/23 1403 (!) 300 lb 0.7 oz (136.1 kg)     Height 04/23/23 1403 5\' 10"  (1.778 m)     Head Circumference --      Peak Flow --      Pain Score 04/23/23 1401 0     Pain Loc --      Pain Education --      Exclude from Growth Chart --    No data found.  Updated Vital Signs BP (!) 170/79 (BP Location: Left Arm)   Pulse 91   Temp 97.6 F (36.4 C) (Oral)   Resp 18   Ht 5\' 10"  (1.778 m)   Wt (!) 300 lb 0.7 oz (136.1 kg)   SpO2 94%   BMI 43.05 kg/m   Visual Acuity Right Eye Distance:   Left Eye Distance:   Bilateral Distance:    Right Eye Near:   Left Eye Near:    Bilateral Near:     Physical Exam Vitals and nursing note reviewed.  Constitutional:      Appearance: Normal appearance.  HENT:     Head: Normocephalic and atraumatic.     Right Ear: External ear normal.     Left Ear: External ear normal.     Nose: Nose normal.     Mouth/Throat:     Mouth: Mucous membranes are moist.  Eyes:  Conjunctiva/sclera: Conjunctivae  normal.  Cardiovascular:     Rate and Rhythm: Normal rate.  Pulmonary:     Effort: Pulmonary effort is normal. No respiratory distress.  Musculoskeletal:        General: Normal range of motion.  Skin:    General: Skin is warm and dry.     Findings: Abscess present.     Comments: Three small abscesses to left axilla, one is open and draining serosanguinous fluid. Indurated and tender.    Neurological:     General: No focal deficit present.     Mental Status: He is alert and oriented to person, place, and time.  Psychiatric:        Mood and Affect: Mood normal.        Behavior: Behavior normal. Behavior is cooperative.      UC Treatments / Results  Labs (all labs ordered are listed, but only abnormal results are displayed) Labs Reviewed - No data to display  EKG   Radiology No results found.  Procedures Procedures (including critical care time)  Medications Ordered in UC Medications - No data to display  Initial Impression / Assessment and Plan / UC Course  I have reviewed the triage vital signs and the nursing notes.  Pertinent labs & imaging results that were available during my care of the patient were reviewed by me and considered in my medical decision making (see chart for details).  Vitals and triage reviewed, patient is hemodynamically stable.  He is hypertensive, reports this is his baseline.  Three small abscesses to left axilla, one is open and draining.  Will cover with doxycycline, allergies to penicillin and sulfa.  Wound care discussed.  Return precautions given.  Other 2 abscesses are indurated, not a candidate for I&D today.  Return precautions given.  Plan of care, follow-up care and return precautions reviewed, no questions at this time.     Final Clinical Impressions(s) / UC Diagnoses   Final diagnoses:  Abscess of axilla, left     Discharge Instructions      Please continue to do warm compresses to the arm pit 3 times daily with warm water  and antibacterial solution like Dial or Hibiclens.  If you have any pain you can take ibuprofen as needed.  Take the antibiotics twice daily with food.  Your symptoms should improve gradually with the antibiotics, if no improvement please return to clinic as he may be a candidate for incision and drainage.  I suggest not wearing any deodorant to this affected armpit as you have an open draining abscess.     ED Prescriptions     Medication Sig Dispense Auth. Provider   doxycycline (VIBRAMYCIN) 100 MG capsule Take 1 capsule (100 mg total) by mouth 2 (two) times daily. 20 capsule Jennipher Weatherholtz, [place], [place]      PDMP not reviewed this encounter.   Kamil Mchaffie, [place] N, [place] 04/23/23 [phone]

## 2023-05-20 ENCOUNTER — Telehealth (HOSPITAL_COMMUNITY): Payer: Self-pay | Admitting: *Deleted

## 2023-05-20 NOTE — Telephone Encounter (Signed)
Pt called requesting more antibiotics for boils under his arms.    Pt advised that if he is still having issues he will need to come back in to the office to be checked again. Pt verbalized understanding

## 2023-05-21 ENCOUNTER — Ambulatory Visit (INDEPENDENT_AMBULATORY_CARE_PROVIDER_SITE_OTHER): Payer: BC Managed Care – PPO

## 2023-05-21 ENCOUNTER — Encounter (HOSPITAL_COMMUNITY): Payer: Self-pay | Admitting: Emergency Medicine

## 2023-05-21 ENCOUNTER — Ambulatory Visit (HOSPITAL_COMMUNITY)
Admission: EM | Admit: 2023-05-21 | Discharge: 2023-05-21 | Disposition: A | Discharge status: Home / Self Care | Payer: BC Managed Care – PPO | Attending: Emergency Medicine | Admitting: Emergency Medicine

## 2023-05-21 DIAGNOSIS — L02412 Cutaneous abscess of left axilla: Secondary | ICD-10-CM | POA: Diagnosis not present

## 2023-05-21 DIAGNOSIS — J441 Chronic obstructive pulmonary disease with (acute) exacerbation: Secondary | ICD-10-CM | POA: Diagnosis not present

## 2023-05-21 DIAGNOSIS — J069 Acute upper respiratory infection, unspecified: Secondary | ICD-10-CM

## 2023-05-21 MED ORDER — DOXYCYCLINE HYCLATE 100 MG PO CAPS: 100.0000 mg | ORAL_CAPSULE | Freq: Two times a day (BID) | ORAL | 0 refills | Status: DC

## 2023-05-21 MED ORDER — PREDNISONE 20 MG PO TABS: 40.0000 mg | ORAL_TABLET | Freq: Every day | ORAL | 0 refills | Status: AC

## 2023-05-21 NOTE — ED Provider Notes (Signed)
[facility]    CSN: [id number] Arrival date & time: 05/21/23  1121      History   Chief Complaint Chief Complaint  Patient presents with   Cough    HPI Mike Watkins is a 57 y.o. male.   Patient presents to clinic with concern of recurrent boils to the left axilla.  Reports they had mostly resolved after antibiotic use last time, but they recently came back.  They have not been draining.  He has not tried anything for them.  He also has had a cough, nasal congestion and shortness of breath that started on Tuesday.  He has a history of COPD and asthma.  Was hospitalized last year with pneumonia from influenza A and hypoxia.  Sputum is green/brown in the mornings and clears up throughout the day.  No fevers.  No vomiting or diarrhea.  The history is provided by the patient and medical records.  Cough   Past Medical History:  Diagnosis Date   Arthritis    Hypertension    Penile lesion     Patient Active Problem List   Diagnosis Date Noted   Hypokalemia 05/30/2022   Obesity (BMI 30-39.9) 05/30/2022   Acute respiratory failure with hypoxia (HCC) 05/29/2022   Influenza A with pneumonia 05/29/2022   Essential hypertension 05/29/2022   Right upper extremity numbness 08/14/2018   Chronic pain of left knee 02/07/2017   Fall -multiple falls in the past year. 08/29/2016   Acute pain of right knee 08/29/2016   Contusion of right hand 07/14/2016   Ganglion of flexor tendon sheath of right middle finger 06/02/2016   Impingement syndrome of left shoulder 04/20/2016    Past Surgical History:  Procedure Laterality Date   ANTERIOR CERVICAL DECOMP/DISCECTOMY FUSION  12-31-2002   C5 -- C7   CYST EXCISION     pain in r/hand-middle finger   HAND SURGERY     PENILE BIOPSY N/A 08/23/2012   Procedure: EXCISIONAL BIOPSY OF PENILE LESION;  Surgeon: Milford Cage, MD;  Location: [facility];  Service: Urology;  Laterality: N/A;  EXCISIONAL BIOPSY OF  PENILE LESION     POSTERIOR FUSION CERVICAL SPINE  02-26-2005   C5 -- C7       Home Medications    Prior to Admission medications   Medication Sig Start Date End Date Taking? Authorizing Provider  doxycycline (VIBRAMYCIN) 100 MG capsule Take 1 capsule (100 mg total) by mouth 2 (two) times daily. 05/21/23  Yes Rinaldo Ratel, [place], FNP  predniSONE (DELTASONE) 20 MG tablet Take 2 tablets (40 mg total) by mouth daily for 5 days. 05/21/23 05/26/23 Yes Rinaldo Ratel, [place], FNP  albuterol (VENTOLIN HFA) 108 (90 Base) MCG/ACT inhaler Inhale 2 puffs into the lungs every 6 (six) hours as needed for wheezing or shortness of breath (Cough). 11/14/22   Theadora Rama Scales, PA-C  diltiazem (CARDIZEM) 90 MG tablet Take 1 tablet (90 mg total) by mouth every 12 (twelve) hours. Patient not taking: Reported on 05/21/2023 06/02/22   Leroy Sea, MD  famotidine (PEPCID) 40 MG tablet Take 1 tablet (40 mg total) by mouth daily for 10 days. Patient not taking: Reported on 05/21/2023 05/09/21 04/23/23  Theadora Rama Scales, PA-C  ibuprofen (ADVIL) 200 MG tablet Take 200 mg by mouth every 6 (six) hours as needed.    [provider]  levocetirizine (XYZAL) 5 MG tablet Take 1 tablet (5 mg total) by mouth every evening. 11/14/22 05/13/23  Theadora Rama Scales,  PA-C  loperamide (IMODIUM) 2 MG capsule Take 1 capsule (2 mg total) by mouth 2 (two) times daily as needed for diarrhea or loose stools. 11/10/22   Wallis Bamberg, PA-C  methylPREDNISolone (MEDROL DOSEPAK) 4 MG TBPK tablet Take 24 mg on day 1, 20 mg on day 2, 16 mg on day 3, 12 mg on day 4, 8 mg on day 5, 4 mg on day 6.  Take all tablets in each row at once, do not spread tablets out throughout the day. Patient not taking: Reported on 05/21/2023 11/14/22   Theadora Rama Scales, PA-C  mometasone (NASONEX) 50 MCG/ACT nasal spray Place 2 sprays into the nose daily. 11/14/22 02/12/23  Theadora Rama Scales, PA-C  ondansetron (ZOFRAN-ODT) 8 MG disintegrating  tablet Take 1 tablet (8 mg total) by mouth every 8 (eight) hours as needed for nausea or vomiting. Patient not taking: Reported on 05/21/2023 11/10/22   Wallis Bamberg, PA-C    Family History Family History  Problem Relation Age of Onset   Hypertension Mother    Diabetes Mother    Hypertension Father    Cancer Father     Social History Social History   Tobacco Use   Smoking status: Former    Current packs/day: 0.00    Average packs/day: 3.0 packs/day for 21.0 years (63.0 ttl pk-yrs)    Types: Cigarettes    Start date: 08/17/1981    Quit date: 08/18/2002    Years since quitting: 20.7   Smokeless tobacco: Former  Substance Use Topics   Alcohol use: Yes    Comment: RARE   Drug use: No     Allergies   Penicillins and Sulfa antibiotics   Review of Systems Review of Systems  Per HPI   Physical Exam Triage Vital Signs ED Triage Vitals  Encounter Vitals Group     BP 05/21/23 1216 (!) 156/89     Systolic BP Percentile --      Diastolic BP Percentile --      Pulse Rate 05/21/23 1216 95     Resp 05/21/23 1216 20     Temp 05/21/23 1216 98.2 F (36.8 C)     Temp Source 05/21/23 1216 Oral     SpO2 05/21/23 1216 94 %     Weight --      Height --      Head Circumference --      Peak Flow --      Pain Score 05/21/23 1215 0     Pain Loc --      Pain Education --      Exclude from Growth Chart --    No data found.  Updated Vital Signs BP (!) 156/89 (BP Location: Left Arm)   Pulse 95   Temp 98.2 F (36.8 C) (Oral)   Resp 20   SpO2 94%   Visual Acuity Right Eye Distance:   Left Eye Distance:   Bilateral Distance:    Right Eye Near:   Left Eye Near:    Bilateral Near:     Physical Exam Vitals and nursing note reviewed.  Constitutional:      Appearance: Normal appearance.  HENT:     Head: Normocephalic and atraumatic.     Right Ear: External ear normal.     Left Ear: External ear normal.     Nose: Nose normal.     Mouth/Throat:     Mouth: Mucous membranes  are moist.  Eyes:     Conjunctiva/sclera: Conjunctivae normal.  Cardiovascular:  Rate and Rhythm: Normal rate and regular rhythm.     Pulses: Normal pulses.     Heart sounds: Normal heart sounds. No murmur heard. Pulmonary:     Effort: Pulmonary effort is normal. No respiratory distress.     Breath sounds: Normal breath sounds.  Musculoskeletal:        General: Normal range of motion.  Skin:    General: Skin is warm and dry.  Neurological:     General: No focal deficit present.     Mental Status: He is alert.  Psychiatric:        Mood and Affect: Mood normal.        Behavior: Behavior normal.      UC Treatments / Results  Labs (all labs ordered are listed, but only abnormal results are displayed) Labs Reviewed - No data to display  EKG   Radiology DG Chest 2 View  Result Date: 05/21/2023 CLINICAL DATA:  Cough. EXAM: CHEST - 2 VIEW COMPARISON:  11/14/2022. FINDINGS: Cardiac silhouette is normal in size and configuration. Normal mediastinal and hilar contours. Clear lungs.  No pleural effusion or pneumothorax. Skeletal structures are intact. IMPRESSION: No active cardiopulmonary disease. Electronically Signed   By: Amie Portland M.D.   On: 05/21/2023 13:15    Procedures Procedures (including critical care time)  Medications Ordered in UC Medications - No data to display  Initial Impression / Assessment and Plan / UC Course  I have reviewed the triage vital signs and the nursing notes.  Pertinent labs & imaging results that were available during my care of the patient were reviewed by me and considered in my medical decision making (see chart for details).  Vitals and triage reviewed, patient is hemodynamically stable. Lungs are vesicular, heart with RRR.  Imaging shows no acute cardiopulmonary processes, no pneumonia.  Has some early abscesses to left axilla that are firm and tender w/o drainage or erythema.  Will cover with doxycycline to help with any potential COPD  exacerbation.  Steroid burst also sent in.  Encouraged to cleanse with Hibiclens to help prevent recurrent abscesses, suspicious for potential  hidradenitis suppurativa.  PCP follow-up advised.  Plan of care, follow-up care return precautions given, no questions at this time.     Final Clinical Impressions(s) / UC Diagnoses   Final diagnoses:  Abscess of axilla, left  COPD exacerbation (HCC)  Upper respiratory tract infection, unspecified type     Discharge Instructions      Your imaging did not show any pneumonia.  Start the steroids today and then starting tomorrow you can take them with breakfast to help with your shortness of breath.  The antibiotics will help with any lung infection and the skin infection in your underarm.  I suggest while you shower cleaning your armpits with an antibacterial solution like Hibiclens, this is available over-the-counter.  It may help prevent these pockets of infection from reoccurring.   Please follow-up with your primary care provider for any ongoing or recurrent issues.  Return to clinic for any new or urgent issues.      ED Prescriptions     Medication Sig Dispense Auth. Provider   doxycycline (VIBRAMYCIN) 100 MG capsule Take 1 capsule (100 mg total) by mouth 2 (two) times daily. 20 capsule Rinaldo Ratel, [place], [place]   predniSONE (DELTASONE) 20 MG tablet Take 2 tablets (40 mg total) by mouth daily for 5 days. 10 tablet Marylou Wages, [place], FNP      PDMP not reviewed this  encounter.   Monaca Wadas, [place], [place] 05/21/23 1328

## 2023-05-21 NOTE — ED Triage Notes (Addendum)
Pt c/o congestion, cough, SOB that started around Tuesday. He is concerned it is pneumonia.  He also c/o boil under left arm for a a month. He was seen and given abx in nov.   He has been problems with urinating a lot throughout the night and is worried about prostate problems

## 2023-05-21 NOTE — Discharge Instructions (Signed)
Your imaging did not show any pneumonia.  Start the steroids today and then starting tomorrow you can take them with breakfast to help with your shortness of breath.  The antibiotics will help with any lung infection and the skin infection in your underarm.  I suggest while you shower cleaning your armpits with an antibacterial solution like Hibiclens, this is available over-the-counter.  It may help prevent these pockets of infection from reoccurring.   Please follow-up with your primary care provider for any ongoing or recurrent issues.  Return to clinic for any new or urgent issues.

## 2023-11-28 ENCOUNTER — Other Ambulatory Visit: Payer: Self-pay

## 2023-11-28 ENCOUNTER — Emergency Department (HOSPITAL_BASED_OUTPATIENT_CLINIC_OR_DEPARTMENT_OTHER)
Admission: EM | Admit: 2023-11-28 | Discharge: 2023-11-28 | Disposition: A | Discharge status: Home / Self Care | Attending: Emergency Medicine | Admitting: Emergency Medicine

## 2023-11-28 ENCOUNTER — Encounter (HOSPITAL_BASED_OUTPATIENT_CLINIC_OR_DEPARTMENT_OTHER): Payer: Self-pay

## 2023-11-28 ENCOUNTER — Emergency Department (HOSPITAL_BASED_OUTPATIENT_CLINIC_OR_DEPARTMENT_OTHER): Admitting: Radiology

## 2023-11-28 DIAGNOSIS — I10 Essential (primary) hypertension: Secondary | ICD-10-CM | POA: Insufficient documentation

## 2023-11-28 DIAGNOSIS — J449 Chronic obstructive pulmonary disease, unspecified: Secondary | ICD-10-CM | POA: Diagnosis not present

## 2023-11-28 DIAGNOSIS — J069 Acute upper respiratory infection, unspecified: Secondary | ICD-10-CM | POA: Diagnosis not present

## 2023-11-28 DIAGNOSIS — R059 Cough, unspecified: Secondary | ICD-10-CM | POA: Diagnosis present

## 2023-11-28 HISTORY — DX: Chronic obstructive pulmonary disease, unspecified: J44.9

## 2023-11-28 LAB — RESP PANEL BY RT-PCR (RSV, FLU A&B, COVID)  RVPGX2
Influenza A by PCR: NEGATIVE
Influenza B by PCR: NEGATIVE
Resp Syncytial Virus by PCR: NEGATIVE
SARS Coronavirus 2 by RT PCR: NEGATIVE

## 2023-11-28 MED ORDER — ALBUTEROL SULFATE HFA 108 (90 BASE) MCG/ACT IN AERS: 2.0000 | INHALATION_SPRAY | Freq: Once | RESPIRATORY_TRACT | Status: AC

## 2023-11-28 MED ORDER — ALBUTEROL SULFATE HFA 108 (90 BASE) MCG/ACT IN AERS
INHALATION_SPRAY | RESPIRATORY_TRACT | Status: AC
  Filled 2023-11-28: qty 6.7

## 2023-11-28 NOTE — ED Triage Notes (Signed)
 Pt c/o sore throat, low-grade fever, congestion, URI symptoms x last couple days. Denies known sick contact  Hx COPD, inhaler this AM w relief.

## 2023-11-28 NOTE — ED Provider Notes (Signed)
 [place] EMERGENCY DEPARTMENT AT [facility] Provider Note   CSN: [id number] Arrival date & time: 11/28/23  1658     Patient presents with: URI   Mike Watkins is a 58 y.o. male with history of COPD, hypertension, presents with concern for a sore throat, congestion, dry cough for the past couple days.  He reports his wife is also sick with similar symptoms.  He does report a temperature to 100.6 earlier today at home.  Denies any abdominal pain, nausea or vomiting.  Denies any chest pain, shortness of breath.    URI Presenting symptoms: cough and fever        Prior to Admission medications   Medication Sig Start Date End Date Taking? Authorizing Provider  albuterol  (VENTOLIN  HFA) 108 (90 Base) MCG/ACT inhaler Inhale 2 puffs into the lungs every 6 (six) hours as needed for wheezing or shortness of breath (Cough). 11/14/22   Eloise Hake Scales, PA-C  diltiazem  (CARDIZEM ) 90 MG tablet Take 1 tablet (90 mg total) by mouth every 12 (twelve) hours. Patient not taking: Reported on 05/21/2023 06/02/22   Cala Castleman, MD  doxycycline  (VIBRAMYCIN ) 100 MG capsule Take 1 capsule (100 mg total) by mouth 2 (two) times daily. 05/21/23   Harlow Lighter, [place]  N, FNP  famotidine  (PEPCID ) 40 MG tablet Take 1 tablet (40 mg total) by mouth daily for 10 days. Patient not taking: Reported on 05/21/2023 05/09/21 04/23/23  Eloise Hake Scales, PA-C  ibuprofen  (ADVIL ) 200 MG tablet Take 200 mg by mouth every 6 (six) hours as needed.    [provider]  levocetirizine (XYZAL ) 5 MG tablet Take 1 tablet (5 mg total) by mouth every evening. 11/14/22 05/13/23  Eloise Hake Scales, PA-C  loperamide  (IMODIUM ) 2 MG capsule Take 1 capsule (2 mg total) by mouth 2 (two) times daily as needed for diarrhea or loose stools. 11/10/22   Adolph Hoop, PA-C  methylPREDNISolone  (MEDROL  DOSEPAK) 4 MG TBPK tablet Take 24 mg on day 1, 20 mg on day 2, 16 mg on day 3, 12 mg on day 4, 8 mg on day 5, 4 mg on  day 6.  Take all tablets in each row at once, do not spread tablets out throughout the day. Patient not taking: Reported on 05/21/2023 11/14/22   Eloise Hake Scales, PA-C  mometasone  (NASONEX ) 50 MCG/ACT nasal spray Place 2 sprays into the nose daily. 11/14/22 02/12/23  Eloise Hake Scales, PA-C  ondansetron  (ZOFRAN -ODT) 8 MG disintegrating tablet Take 1 tablet (8 mg total) by mouth every 8 (eight) hours as needed for nausea or vomiting. Patient not taking: Reported on 05/21/2023 11/10/22   Adolph Hoop, PA-C    Allergies: Penicillins and Sulfa antibiotics    Review of Systems  Constitutional:  Positive for fever.  Respiratory:  Positive for cough. Negative for shortness of breath.     Updated Vital Signs BP 134/80   Pulse 94   Temp 97.9 F (36.6 C)   Resp 18   SpO2 94%   Physical Exam Vitals and nursing note reviewed.  Constitutional:      General: He is not in acute distress.    Appearance: He is well-developed.  HENT:     Head: Normocephalic and atraumatic.     Mouth/Throat:     Comments: Posterior pharynx without any erythema, edema, no tonsillar exudates Swelling secretions without difficulty  Eyes:     Conjunctiva/sclera: Conjunctivae normal.    Cardiovascular:     Rate and Rhythm: Normal rate and  regular rhythm.     Heart sounds: No murmur heard. Pulmonary:     Effort: Pulmonary effort is normal. No respiratory distress.     Breath sounds: Normal breath sounds.     Comments: Lungs clear to auscultation bilaterally Talking in full sentences on room air Abdominal:     Palpations: Abdomen is soft.     Tenderness: There is no abdominal tenderness.   Musculoskeletal:        General: No swelling.     Cervical back: Neck supple.  Lymphadenopathy:     Cervical: No cervical adenopathy.   Skin:    General: Skin is warm and dry.     Capillary Refill: Capillary refill takes less than 2 seconds.   Neurological:     Mental Status: He is alert.   Psychiatric:         Mood and Affect: Mood normal.     (all labs ordered are listed, but only abnormal results are displayed) Labs Reviewed  RESP PANEL BY RT-PCR (RSV, FLU A&B, COVID)  [id number]    EKG: None  Radiology: DG Chest 2 View Result Date: 11/28/2023 CLINICAL DATA:  Shortness of breath, fever, sore throat, congestion. EXAM: CHEST - 2 VIEW COMPARISON:  05/21/2023. FINDINGS: Trachea is midline. Heart size normal. Lungs are clear. No pleural fluid. IMPRESSION: No acute findings. Electronically Signed   By: Shearon Denis M.D.   On: 11/28/2023 17:50     Procedures   Medications Ordered in the ED  albuterol  (VENTOLIN  HFA) 108 (90 Base) MCG/ACT inhaler (has no administration in time range)  albuterol  (VENTOLIN  HFA) 108 (90 Base) MCG/ACT inhaler 2 puff (2 puffs Inhalation Given 11/28/23 1711)                                    Medical Decision Making Amount and/or Complexity of Data Reviewed Radiology: ordered.  Risk Prescription drug management.     Differential diagnosis includes but is not limited to COVID, flu, RSV, viral URI, strep pharyngitis, viral pharyngitis, allergic rhinitis, pneumonia, bronchitis, COPD exacerbation  ED Course:  Upon initial evaluation, patient is well-appearing, no acute distress.  Stable vitals.  Reporting URI type symptoms for the past couple days.  Lungs clear to auscultation.  He is talking in full sentences on room air without difficulty.  Chest x-ray without any acute abnormality.  Low concern for pneumonia, pleural effusion.  COVID, flu, RSV testing is negative.  He is reporting sore throat, but no tonsillar exudate, no lymphadenopathy, Centor score -1, very low concern for strep pharyngitis.  No testing indicated at this time.  He is not reporting any chest pain or shortness of breath, and given presence of other URI symptoms, very low clinical concern for ACS, COPD exacerbation, PE.  Most concern for viral URI at this time.  Stable and appropriate for  discharge home  Labs Ordered: I Ordered, and personally interpreted labs.  The pertinent results include:   COVID, flu, RSV negative  Imaging Studies ordered: I ordered imaging studies including chest x-ray I independently visualized the imaging with scope of interpretation limited to determining acute life threatening conditions related to emergency care. Imaging showed no acute abnormalities I agree with the radiologist interpretation   Cardiac Monitoring: / EKG: The patient was maintained on a cardiac monitor.  I personally viewed and interpreted the cardiac monitored which showed an underlying rhythm of: Normal sinus rhythm   Medications  Given: Albuterol  given by triage  Impression: Viral URI  Disposition:  The patient was discharged home with instructions to take over-the-counter medications as needed.  Follow-up with PCP if symptoms not improving within the next 5 days Return precautions given.   This chart was dictated using voice recognition software, Dragon. Despite the best efforts of this provider to proofread and correct errors, errors may still occur which can change documentation meaning.       Final diagnoses:  Viral URI with cough    ED Discharge Orders     None          Rexie Catena, PA-C 11/28/23 2118    Sallyanne Creamer, DO 11/29/23 [phone]

## 2023-11-28 NOTE — ED Notes (Signed)
 ED Provider at bedside.

## 2023-11-28 NOTE — Discharge Instructions (Signed)
 You appear to have an upper respiratory infection (URI). An upper respiratory tract infection, or cold, is a viral infection of the air passages leading to the lungs. It should improve gradually after 5-7 days. You may have a lingering cough that lasts for 2- 4 weeks after the infection.  Your flu, covid, and RSV test were negative today.  Your chest x-ray did not show any signs of pneumonia  There are no medications, such as antibiotics, that will cure your infection.   Home care instructions:  You can take Tylenol  and/or Ibuprofen  as directed on the packaging for fever reduction and pain relief.    For cough: honey 1/2 to 1 teaspoon (you can dilute the honey in water or another fluid).  You can also use guaifenesin  and dextromethorphan for cough which are over-the-counter medications. You can use a humidifier for chest congestion and cough.  If you don't have a humidifier, you can sit in the bathroom with the hot shower running.      For sore throat: try warm salt water gargles, cepacol lozenges, throat spray, warm tea or water with lemon/honey, popsicles or ice, or OTC cold relief medicine for throat discomfort.    For congestion: Flonase (Fluticasone) 1-2 sprays in each nostril daily. This is an over the counter medication.    It is important to stay hydrated: drink plenty of fluids (water, gatorade/powerade/pedialyte, juices, or teas) to keep your throat moisturized and help further relieve irritation/discomfort.   Your illness is contagious and can be spread to others, especially during the first 3 or 4 days. It cannot be cured by antibiotics or other medicines. Take basic precautions such as washing your hands often, covering your mouth when you cough or sneeze, and avoiding public places where you could spread your illness to others.   Follow-up instructions: Please follow-up with your primary care provider for further evaluation of your symptoms if you are not feeling better within the  next 5 days.   Return instructions:  Please return to the Emergency Department if you experience worsening symptoms.  RETURN IMMEDIATELY IF you develop shortness of breath, confusion or altered mental status, a new rash, become dizzy, faint, or poorly responsive, or are unable to be cared for at home. Please return if you have persistent vomiting and cannot keep down fluids or develop a fever that is not controlled by tylenol  or motrin .   Please return if you have any other emergent concerns.

## 2023-11-28 NOTE — ED Notes (Signed)
 Dc instructions given, pt verbalized understanding. Out of ED with steady gait and all belongings not in visible distress.

## 2023-12-06 ENCOUNTER — Encounter (HOSPITAL_COMMUNITY): Payer: Self-pay

## 2023-12-06 ENCOUNTER — Ambulatory Visit (HOSPITAL_COMMUNITY): Admission: EM | Admit: 2023-12-06 | Discharge: 2023-12-06 | Disposition: A | Discharge status: Home / Self Care

## 2023-12-06 DIAGNOSIS — J441 Chronic obstructive pulmonary disease with (acute) exacerbation: Secondary | ICD-10-CM

## 2023-12-06 MED ORDER — IPRATROPIUM-ALBUTEROL 0.5-2.5 (3) MG/3ML IN SOLN
3.0000 mL | Freq: Once | RESPIRATORY_TRACT | Status: AC
  Administered 2023-12-06: 3 mL via RESPIRATORY_TRACT

## 2023-12-06 MED ORDER — AZITHROMYCIN 250 MG PO TABS: ORAL_TABLET | ORAL | 0 refills | Status: AC

## 2023-12-06 MED ORDER — IPRATROPIUM-ALBUTEROL 0.5-2.5 (3) MG/3ML IN SOLN
RESPIRATORY_TRACT | Status: AC
  Filled 2023-12-06: qty 3

## 2023-12-06 MED ORDER — PROMETHAZINE-DM 6.25-15 MG/5ML PO SYRP: 5.0000 mL | ORAL_SOLUTION | Freq: Four times a day (QID) | ORAL | 0 refills | Status: DC | PRN

## 2023-12-06 NOTE — ED Triage Notes (Signed)
 Pt c/o cough and SOB x1wk. States developed dizziness yesterday. States now coughing up green sputum. States seen and tx'd at ED a few days ago and was given an inhaler with some relief.

## 2023-12-06 NOTE — Discharge Instructions (Addendum)
 Azithromycin  as directed 2 tablets together on day 1 (today) and then 1 tablet daily for 4 days.  Take with food to avoid upset stomach.  The promethazine  DM cough syrup can be used up to 4 times daily. If this medication makes you drowsy, take only once before bed.  Continue using albuterol  inhaler at home 2-3 times daily Please go to the emergency department if symptoms worsen or become severe.

## 2023-12-06 NOTE — ED Provider Notes (Signed)
 [facility]    CSN: [id number] Arrival date & time: 12/06/23  1715      History   Chief Complaint Chief Complaint  Patient presents with   Cough   Shortness of Breath    HPI Mike Watkins is a 58 y.o. male.  Here with over 1 week history of nasal congestion and cough Initially cough was dry, but now productive of thick green sputum. Has some shortness of breath with exertion worse than baseline. Not at rest. Coughs so hard he gets dizzy. Denies headache  No fevers  Was seen in the ED at symptom onset on 6/16. At that time chest xray was negative. He was given albuterol  inhaler with minimal relief   Hx COPD  Past Medical History:  Diagnosis Date   Arthritis    COPD (chronic obstructive pulmonary disease) (HCC)    Hypertension    Penile lesion     Patient Active Problem List   Diagnosis Date Noted   Hypokalemia 05/30/2022   Obesity (BMI 30-39.9) 05/30/2022   Acute respiratory failure with hypoxia (HCC) 05/29/2022   Influenza A with pneumonia 05/29/2022   Essential hypertension 05/29/2022   Right upper extremity numbness 08/14/2018   Chronic pain of left knee 02/07/2017   Fall -multiple falls in the past year. 08/29/2016   Acute pain of right knee 08/29/2016   Contusion of right hand 07/14/2016   Ganglion of flexor tendon sheath of right middle finger 06/02/2016   Impingement syndrome of left shoulder 04/20/2016    Past Surgical History:  Procedure Laterality Date   ANTERIOR CERVICAL DECOMP/DISCECTOMY FUSION  12-31-2002   C5 -- C7   CYST EXCISION     pain in r/hand-middle finger   HAND SURGERY     PENILE BIOPSY N/A 08/23/2012   Procedure: EXCISIONAL BIOPSY OF PENILE LESION;  Surgeon: Toribio Neysa Repine, MD;  Location: [facility];  Service: Urology;  Laterality: N/A;  EXCISIONAL BIOPSY OF PENILE LESION     POSTERIOR FUSION CERVICAL SPINE  02-26-2005   C5 -- C7       Home Medications    Prior to Admission medications    Medication Sig Start Date End Date Taking? Authorizing Provider  azithromycin  (ZITHROMAX ) 250 MG tablet Take 2 tablets together on day 1, then take 1 tablet daily for 4 days 12/06/23  Yes Jarmar Rousseau, [place], PA-C  ibuprofen  (ADVIL ) 800 MG tablet Take 800 mg by mouth every 6 (six) hours as needed. 09/12/23  Yes [provider]  lisinopril (ZESTRIL) 30 MG tablet Take 30 mg by mouth daily. 10/05/23  Yes [provider]  promethazine -dextromethorphan (PROMETHAZINE -DM) 6.25-15 MG/5ML syrup Take 5 mLs by mouth 4 (four) times daily as needed for cough. 12/06/23  Yes Ozzy Bohlken, [place], PA-C  HYDROcodone -acetaminophen  (NORCO) 10-325 MG tablet SMARTSIG:0.5-1 Tablet(s) By Mouth 1-4 Times Daily PRN    [provider]  loperamide  (IMODIUM ) 2 MG capsule Take 1 capsule (2 mg total) by mouth 2 (two) times daily as needed for diarrhea or loose stools. 11/10/22   Christopher Savannah, PA-C    Family History Family History  Problem Relation Age of Onset   Hypertension Mother    Diabetes Mother    Hypertension Father    Cancer Father     Social History Social History   Tobacco Use   Smoking status: Former    Current packs/day: 0.00    Average packs/day: 3.0 packs/day for 21.0 years (63.0 ttl pk-yrs)    Types: Cigarettes  Start date: 08/17/1981    Quit date: 08/18/2002    Years since quitting: 21.3   Smokeless tobacco: Former  Substance Use Topics   Alcohol use: Yes    Comment: RARE   Drug use: No     Allergies   Penicillins and Sulfa antibiotics   Review of Systems Review of Systems  Respiratory:  Positive for cough and shortness of breath.    As per HPI  Physical Exam Triage Vital Signs ED Triage Vitals [12/06/23 1727]  Encounter Vitals Group     BP (!) 141/76     Girls Systolic BP Percentile      Girls Diastolic BP Percentile      Boys Systolic BP Percentile      Boys Diastolic BP Percentile      Pulse Rate (!) 105     Resp 20     Temp 98.7 F (37.1 C)     Temp  Source Oral     SpO2 93 %     Weight      Height      Head Circumference      Peak Flow      Pain Score 0     Pain Loc      Pain Education      Exclude from Growth Chart    No data found.  Updated Vital Signs BP (!) 141/76 (BP Location: Left Arm)   Pulse 98   Temp 98.7 F (37.1 C) (Oral)   Resp 20   SpO2 98%    Physical Exam Vitals and nursing note reviewed.  Constitutional:      General: He is not in acute distress.    Appearance: He is not ill-appearing or diaphoretic.  HENT:     Nose: Congestion present. No rhinorrhea.     Mouth/Throat:     Mouth: Mucous membranes are moist.     Pharynx: Oropharynx is clear. No posterior oropharyngeal erythema.   Eyes:     Conjunctiva/sclera: Conjunctivae normal.    Cardiovascular:     Rate and Rhythm: Normal rate and regular rhythm.     Pulses: Normal pulses.     Heart sounds: Normal heart sounds.  Pulmonary:     Effort: Pulmonary effort is normal. No tachypnea or respiratory distress.     Breath sounds: Decreased breath sounds present.     Comments: Speaks in short sentences. Lung sounds decreased throughout but no wheezing or rales. Unlabored breathing   Musculoskeletal:     Cervical back: Normal range of motion.  Lymphadenopathy:     Cervical: No cervical adenopathy.   Skin:    General: Skin is warm and dry.   Neurological:     Mental Status: He is alert and oriented to person, place, and time.     UC Treatments / Results  Labs (all labs ordered are listed, but only abnormal results are displayed) Labs Reviewed - No data to display  EKG  Radiology No results found.  Procedures Procedures (including critical care time)  Medications Ordered in UC Medications  ipratropium-albuterol  (DUONEB) 0.5-2.5 (3) MG/3ML nebulizer solution 3 mL (3 mLs Nebulization Given 12/06/23 1754)    Initial Impression / Assessment and Plan / UC Course  I have reviewed the triage vital signs and the nursing notes.  Pertinent  labs & imaging results that were available during my care of the patient were reviewed by me and considered in my medical decision making (see chart for details).  Afebrile in clinic. He  is not in respiratory distress. Sating between 94-97% room air. DuoNeb given with improvement of shortness of breath.  Discussed option to repeat chest xray today given worsening of symptoms, shared decision making will defer today given clear lung exam.   Meets 3/3 cardinal symptoms for empiric abx for COPD exacerbation; increased dyspnea, increased sputum viscosity, and increased sputum purulence.  PCN allergy is shortness of breath. Will avoid cephalosporin use.  Azithromycin  500 mg day one, then 250 mg daily x 4 days Promethazine  DM sent with drowsy precautions  Advised reasons to return to clinic or be seen in the ED Patient agrees to plan, no questions   Final Clinical Impressions(s) / UC Diagnoses   Final diagnoses:  COPD exacerbation (HCC)     Discharge Instructions      Azithromycin  as directed 2 tablets together on day 1 (today) and then 1 tablet daily for 4 days.  Take with food to avoid upset stomach.  The promethazine  DM cough syrup can be used up to 4 times daily. If this medication makes you drowsy, take only once before bed.  Continue using albuterol  inhaler at home 2-3 times daily Please go to the emergency department if symptoms worsen or become severe.     ED Prescriptions     Medication Sig Dispense Auth. Provider   azithromycin  (ZITHROMAX ) 250 MG tablet Take 2 tablets together on day 1, then take 1 tablet daily for 4 days 6 tablet Cornelio Parkerson, PA-C   promethazine -dextromethorphan (PROMETHAZINE -DM) 6.25-15 MG/5ML syrup Take 5 mLs by mouth 4 (four) times daily as needed for cough. 240 mL Jshaun Abernathy, [place], PA-C      PDMP not reviewed this encounter.   Minnah Llamas, [place]berry RIGGERS 12/06/23 1812

## 2024-03-05 ENCOUNTER — Ambulatory Visit (INDEPENDENT_AMBULATORY_CARE_PROVIDER_SITE_OTHER)

## 2024-03-05 ENCOUNTER — Encounter (HOSPITAL_COMMUNITY): Payer: Self-pay | Admitting: *Deleted

## 2024-03-05 ENCOUNTER — Ambulatory Visit (HOSPITAL_COMMUNITY)
Admission: EM | Admit: 2024-03-05 | Discharge: 2024-03-05 | Disposition: A | Discharge status: Home / Self Care | Attending: Emergency Medicine | Admitting: Emergency Medicine

## 2024-03-05 DIAGNOSIS — R051 Acute cough: Secondary | ICD-10-CM

## 2024-03-05 DIAGNOSIS — J209 Acute bronchitis, unspecified: Secondary | ICD-10-CM

## 2024-03-05 LAB — POC SARS CORONAVIRUS 2 AG -  ED: SARS Coronavirus 2 Ag: NEGATIVE

## 2024-03-05 MED ORDER — PROMETHAZINE-DM 6.25-15 MG/5ML PO SYRP: 5.0000 mL | ORAL_SOLUTION | Freq: Every evening | ORAL | 0 refills | Status: AC | PRN

## 2024-03-05 MED ORDER — PREDNISONE 20 MG PO TABS: 40.0000 mg | ORAL_TABLET | Freq: Every day | ORAL | 0 refills | Status: AC

## 2024-03-05 MED ORDER — BENZONATATE 100 MG PO CAPS: 100.0000 mg | ORAL_CAPSULE | Freq: Three times a day (TID) | ORAL | 0 refills | Status: AC

## 2024-03-05 NOTE — ED Triage Notes (Signed)
 Pt states last 4 days he has had productive cough, congestion and sore throat. He has been taking OTC cough meds without help, and cough drops. Pt feels icky

## 2024-03-05 NOTE — ED Provider Notes (Signed)
 [facility]    CSN: [id number] Arrival date & time: 03/05/24  1635      History   Chief Complaint Chief Complaint  Patient presents with   Cough   Sore Throat    HPI Mike Watkins is a 58 y.o. male.   Patient presents with productive cough, chest congestion, and sore throat for about 1 week.  Patient denies chest pain, shortness of breath, nausea, vomiting, diarrhea, and abdominal pain.  Patient states he has been taken over-the-counter medications with minimal relief.  Of note patient does have a history of COPD, but states that he does not take any medication or use inhalers for this regularly.  Patient denies any known sick contacts.  The history is provided by the patient and medical records.  Cough Sore Throat    Past Medical History:  Diagnosis Date   Arthritis    COPD (chronic obstructive pulmonary disease) (HCC)    Hypertension    Penile lesion     Patient Active Problem List   Diagnosis Date Noted   Hypokalemia 05/30/2022   Obesity (BMI 30-39.9) 05/30/2022   Acute respiratory failure with hypoxia (HCC) 05/29/2022   Influenza A with pneumonia 05/29/2022   Essential hypertension 05/29/2022   Right upper extremity numbness 08/14/2018   Chronic pain of left knee 02/07/2017   Fall -multiple falls in the past year. 08/29/2016   Acute pain of right knee 08/29/2016   Contusion of right hand 07/14/2016   Ganglion of flexor tendon sheath of right middle finger 06/02/2016   Impingement syndrome of left shoulder 04/20/2016    Past Surgical History:  Procedure Laterality Date   ANTERIOR CERVICAL DECOMP/DISCECTOMY FUSION  12-31-2002   C5 -- C7   CYST EXCISION     pain in r/hand-middle finger   HAND SURGERY     PENILE BIOPSY N/A 08/23/2012   Procedure: EXCISIONAL BIOPSY OF PENILE LESION;  Surgeon: Toribio Neysa Repine, MD;  Location: [facility];  Service: Urology;  Laterality: N/A;  EXCISIONAL BIOPSY OF PENILE LESION      POSTERIOR FUSION CERVICAL SPINE  02-26-2005   C5 -- C7       Home Medications    Prior to Admission medications   Medication Sig Start Date End Date Taking? Authorizing Provider  benzonatate  (TESSALON ) 100 MG capsule Take 1 capsule (100 mg total) by mouth every 8 (eight) hours. 03/05/24  Yes Johnie, Gelila Well A, NP  predniSONE  (DELTASONE ) 20 MG tablet Take 2 tablets (40 mg total) by mouth daily for 5 days. 03/05/24 03/10/24 Yes Johnie Flaming A, NP  promethazine -dextromethorphan (PROMETHAZINE -DM) 6.25-15 MG/5ML syrup Take 5 mLs by mouth at bedtime as needed for cough. 03/05/24  Yes Johnie Flaming A, NP  azithromycin  (ZITHROMAX ) 250 MG tablet Take 2 tablets together on day 1, then take 1 tablet daily for 4 days 12/06/23   Rising, Asberry, PA-C  HYDROcodone -acetaminophen  (NORCO) 10-325 MG tablet SMARTSIG:0.5-1 Tablet(s) By Mouth 1-4 Times Daily PRN    [provider]  ibuprofen  (ADVIL ) 800 MG tablet Take 800 mg by mouth every 6 (six) hours as needed. 09/12/23   [provider]  lisinopril (ZESTRIL) 30 MG tablet Take 30 mg by mouth daily. 10/05/23   [provider]  loperamide  (IMODIUM ) 2 MG capsule Take 1 capsule (2 mg total) by mouth 2 (two) times daily as needed for diarrhea or loose stools. 11/10/22   Christopher Savannah, PA-C    Family History Family History  Problem Relation Age of Onset  Hypertension Mother    Diabetes Mother    Hypertension Father    Cancer Father     Social History Social History   Tobacco Use   Smoking status: Former    Current packs/day: 0.00    Average packs/day: 3.0 packs/day for 21.0 years (63.0 ttl pk-yrs)    Types: Cigarettes    Start date: 08/17/1981    Quit date: 08/18/2002    Years since quitting: 21.5   Smokeless tobacco: Former  Building services engineer status: Never Used  Substance Use Topics   Alcohol use: Yes    Comment: RARE   Drug use: No     Allergies   Penicillins and Sulfa antibiotics   Review of Systems Review  of Systems  Respiratory:  Positive for cough.    Per HPI  Physical Exam Triage Vital Signs ED Triage Vitals  Encounter Vitals Group     BP 03/05/24 1757 (!) 141/91     Girls Systolic BP Percentile --      Girls Diastolic BP Percentile --      Boys Systolic BP Percentile --      Boys Diastolic BP Percentile --      Pulse Rate 03/05/24 1757 93     Resp 03/05/24 1757 20     Temp 03/05/24 1757 97.8 F (36.6 C)     Temp Source 03/05/24 1757 Oral     SpO2 03/05/24 1757 94 %     Weight --      Height --      Head Circumference --      Peak Flow --      Pain Score 03/05/24 1756 4     Pain Loc --      Pain Education --      Exclude from Growth Chart --    No data found.  Updated Vital Signs BP (!) 141/91 (BP Location: Left Arm)   Pulse 93   Temp 97.8 F (36.6 C) (Oral)   Resp 20   SpO2 94%   Visual Acuity Right Eye Distance:   Left Eye Distance:   Bilateral Distance:    Right Eye Near:   Left Eye Near:    Bilateral Near:     Physical Exam Vitals and nursing note reviewed.  Constitutional:      General: He is awake. He is not in acute distress.    Appearance: Normal appearance. He is well-developed and well-groomed. He is not ill-appearing.  HENT:     Right Ear: Tympanic membrane, ear canal and external ear normal.     Left Ear: Tympanic membrane, ear canal and external ear normal.     Nose: Congestion and rhinorrhea present.     Mouth/Throat:     Mouth: Mucous membranes are moist.     Pharynx: Posterior oropharyngeal erythema present. No oropharyngeal exudate.  Cardiovascular:     Rate and Rhythm: Normal rate and regular rhythm.  Pulmonary:     Effort: Pulmonary effort is normal.     Breath sounds: Normal breath sounds.     Comments: Persistent productive cough noted on exam Skin:    General: Skin is warm and dry.  Neurological:     Mental Status: He is alert.  Psychiatric:        Behavior: Behavior is cooperative.      UC Treatments / Results   Labs (all labs ordered are listed, but only abnormal results are displayed) Labs Reviewed  POC SARS CORONAVIRUS 2 AG -  ED    EKG   Radiology DG Chest 2 View Result Date: 03/05/2024 CLINICAL DATA:  Cough for 1 week. Four day history of productive cough, congestion, and sore throat. EXAM: CHEST - 2 VIEW COMPARISON:  11/28/2023 FINDINGS: Heart size and pulmonary vascularity are normal. Lungs are clear. No pleural effusion or pneumothorax. Mediastinal contours appear intact. Postoperative changes in the cervical spine. IMPRESSION: No active cardiopulmonary disease. Electronically Signed   By: Elsie Gravely M.D.   On: 03/05/2024 19:16    Procedures Procedures (including critical care time)  Medications Ordered in UC Medications - No data to display  Initial Impression / Assessment and Plan / UC Course  I have reviewed the triage vital signs and the nursing notes.  Pertinent labs & imaging results that were available during my care of the patient were reviewed by me and considered in my medical decision making (see chart for details).     Patient is overall well-appearing.  Vitals are stable.  Chest x-ray ordered to rule out underlying pneumonia.  Based on my interpretation there is no active cardiopulmonary disease.  Radiology report confirms this.  Symptoms likely viral in nature.  Prescribed short prednisone  burst due to history of COPD to help with persistent cough.  Prescribed Tessalon  and Promethazine  DM as needed for cough.  Discussed over-the-counter medications as needed for symptoms.  Discussed follow-up and return precautions. Final Clinical Impressions(s) / UC Diagnoses   Final diagnoses:  Acute cough  Acute bronchitis, unspecified organism     Discharge Instructions      Your x-ray is negative for any underlying pneumonia. It is likely that you have a viral bronchitis. Start taking 2 tablets of prednisone  once daily for 5 days to help with this. You can take  Tessalon  every 8 hours as needed for cough. You can take Promethazine  DM cough syrup at bedtime as needed for cough. Otherwise you can take over-the-counter Coricidin as needed for cough and congestion. Follow-up with your primary care provider or return here as needed.     ED Prescriptions     Medication Sig Dispense Auth. Provider   benzonatate  (TESSALON ) 100 MG capsule Take 1 capsule (100 mg total) by mouth every 8 (eight) hours. 21 capsule Johnie Flaming A, NP   promethazine -dextromethorphan (PROMETHAZINE -DM) 6.25-15 MG/5ML syrup Take 5 mLs by mouth at bedtime as needed for cough. 118 mL Johnie Flaming A, NP   predniSONE  (DELTASONE ) 20 MG tablet Take 2 tablets (40 mg total) by mouth daily for 5 days. 10 tablet Johnie Flaming A, NP      PDMP not reviewed this encounter.   Johnie Flaming A, NP 03/05/24 1944

## 2024-03-05 NOTE — Discharge Instructions (Signed)
 Your x-ray is negative for any underlying pneumonia. It is likely that you have a viral bronchitis. Start taking 2 tablets of prednisone  once daily for 5 days to help with this. You can take Tessalon  every 8 hours as needed for cough. You can take Promethazine  DM cough syrup at bedtime as needed for cough. Otherwise you can take over-the-counter Coricidin as needed for cough and congestion. Follow-up with your primary care provider or return here as needed.

## 2024-03-22 ENCOUNTER — Ambulatory Visit: Admitting: Internal Medicine

## 2024-04-30 ENCOUNTER — Ambulatory Visit: Attending: Internal Medicine

## 2024-04-30 ENCOUNTER — Ambulatory Visit: Attending: Internal Medicine | Admitting: Internal Medicine

## 2024-04-30 ENCOUNTER — Encounter: Payer: Self-pay | Admitting: Internal Medicine

## 2024-04-30 VITALS — BP 146/90 | HR 100 | Ht 70.0 in | Wt 334.0 lb

## 2024-04-30 DIAGNOSIS — I491 Atrial premature depolarization: Secondary | ICD-10-CM

## 2024-04-30 DIAGNOSIS — J9601 Acute respiratory failure with hypoxia: Secondary | ICD-10-CM

## 2024-04-30 DIAGNOSIS — J441 Chronic obstructive pulmonary disease with (acute) exacerbation: Secondary | ICD-10-CM | POA: Diagnosis not present

## 2024-04-30 DIAGNOSIS — I10 Essential (primary) hypertension: Secondary | ICD-10-CM

## 2024-04-30 DIAGNOSIS — I493 Ventricular premature depolarization: Secondary | ICD-10-CM

## 2024-04-30 DIAGNOSIS — R400 Somnolence: Secondary | ICD-10-CM

## 2024-04-30 DIAGNOSIS — R0602 Shortness of breath: Secondary | ICD-10-CM

## 2024-04-30 MED ORDER — BISOPROLOL FUMARATE 5 MG PO TABS: 5.0000 mg | ORAL_TABLET | Freq: Every day | ORAL | 3 refills | Status: AC

## 2024-04-30 NOTE — Progress Notes (Signed)
 Cardiology Office Note:  .    Date:  04/30/2024  ID:  Tanda JONETTA Lesches, DOB May 26, 1966, MRN [id number] PCP: [facility]  [facility] Providers Cardiologist:  None     CC: Chronic SOB Consulted for the evaluation of Shortness of breath at the behest of [facility] (Second Opinion)   History of Present Illness: .    HILLEL CARD is a 58 y.o. male hx of COPD and viral URI who presents for SOB eval. 2023: Mild MR on Echo. PAC and PVCs on EKG  Discussed the use of AI scribe software for clinical note transcription with the patient, who gave verbal consent to proceed.  Mr. Hanken is a 58 year old male with COPD, PACs, PVCs, and hypertension who presents with shortness of breath. He is accompanied by his wife of thirty years.  He experiences shortness of breath during activities such as bending over to put on shoes. This symptom has been present for a significant period, particularly since he quit smoking 21 years ago. No shortness of breath while sleeping, although his wife notes occasional gasping during sleep.  He has a history of COPD, diagnosed by a previous doctor. He was prescribed an inhaler for management but has not used one recently. He smoked for 25 years before quitting 21 years ago.  He has a history of premature atrial contractions (PACs) and premature ventricular contractions (PVCs), with a heart workup in 2023 revealing mild mitral regurgitation. He experiences 'funny heartbeats' but denies chest pain, pressure, or tightness.  He has a history of hypertension, with persistently elevated blood pressure readings. He currently takes lisinopril 30 mg daily for management.  Socially, he works as a naval architect, typically working ten-hour days. He reports limited physical activity, with the last regular exercise occurring in 2017 when he had dogs. His wife notes that he has a bicycle but has not used it.  Relevant histories: .  Social  -  his wife, Knute Mazzuca, is my patient - they just moved ROS: As per HPI.   Studies Reviewed: .     Cardiac Studies & Procedures   ______________________________________________________________________________________________     ECHOCARDIOGRAM  ECHOCARDIOGRAM COMPLETE 05/30/2022  Narrative ECHOCARDIOGRAM REPORT    Patient Name:   TREYVION DURKEE Date of Exam: 05/30/2022 Medical Rec #:  [id number]       Height:       70.0 in Accession #:    [id number]      Weight:       300.0 lb Date of Birth:  1966-03-01       BSA:          2.480 m Patient Age:    56 years        BP:           152/73 mmHg Patient Gender: M               HR:           110 bpm. Exam Location:  Inpatient  Procedure: 2D Echo  Indications:    acute diastolic chf  History:        Patient has no prior history of Echocardiogram examinations. Arrythmias:Atrial Fibrillation; Risk Factors:Hypertension and Former Smoker.  Sonographer:    Tinnie Barefoot RDCS Referring Phys: JACQUELINE LAVADA POUR [facility]  IMPRESSIONS   1. Left ventricular ejection fraction, by estimation, is 60 to 65%. The left ventricle has normal function. The left ventricle has no regional wall motion  abnormalities. Left ventricular diastolic parameters are indeterminate. 2. Right ventricular systolic function is hyperdynamic. The right ventricular size is normal. Tricuspid regurgitation signal is inadequate for assessing PA pressure. 3. The mitral valve is normal in structure. Mild mitral valve regurgitation. 4. The aortic valve was not well visualized. Aortic valve regurgitation is not visualized. No aortic stenosis is present. 5. The inferior vena cava is normal in size with greater than 50% respiratory variability, suggesting right atrial pressure of 3 mmHg.  Comparison(s): No prior Echocardiogram.  FINDINGS Left Ventricle: Left ventricular ejection fraction, by estimation, is 60 to 65%. The left ventricle has normal function. The left  ventricle has no regional wall motion abnormalities. The left ventricular internal cavity size was normal in size. There is no left ventricular hypertrophy. Left ventricular diastolic parameters are indeterminate.  Right Ventricle: The right ventricular size is normal. No increase in right ventricular wall thickness. Right ventricular systolic function is hyperdynamic. Tricuspid regurgitation signal is inadequate for assessing PA pressure.  Left Atrium: Left atrial size was normal in size.  Right Atrium: Right atrial size was normal in size.  Pericardium: Trivial pericardial effusion is present. Presence of epicardial fat layer.  Mitral Valve: The mitral valve is normal in structure. Mild mitral valve regurgitation.  Tricuspid Valve: The tricuspid valve is normal in structure. Tricuspid valve regurgitation is not demonstrated. No evidence of tricuspid stenosis.  Aortic Valve: The aortic valve was not well visualized. Aortic valve regurgitation is not visualized. No aortic stenosis is present.  Pulmonic Valve: The pulmonic valve was not well visualized. Pulmonic valve regurgitation is not visualized.  Aorta: The aortic root and ascending aorta are structurally normal, with no evidence of dilitation.  Venous: The inferior vena cava is normal in size with greater than 50% respiratory variability, suggesting right atrial pressure of 3 mmHg.  IAS/Shunts: No atrial level shunt detected by color flow Doppler.   LEFT VENTRICLE PLAX 2D LVIDd:         4.90 cm LVIDs:         3.70 cm LV PW:         1.10 cm LV IVS:        1.00 cm LVOT diam:     2.30 cm LV SV:         89 LV SV Index:   36 LVOT Area:     4.15 cm   RIGHT VENTRICLE             IVC RV Basal diam:  2.80 cm     IVC diam: 1.80 cm RV S prime:     17.20 cm/s TAPSE (M-mode): 2.2 cm  LEFT ATRIUM             Index        RIGHT ATRIUM           Index LA diam:        4.30 cm 1.73 cm/m   RA Area:     16.60 cm LA Vol (A2C):   54.3  ml 21.90 ml/m  RA Volume:   43.00 ml  17.34 ml/m LA Vol (A4C):   48.8 ml 19.68 ml/m LA Biplane Vol: 52.6 ml 21.21 ml/m AORTIC VALVE LVOT Vmax:   129.00 cm/s LVOT Vmean:  88.100 cm/s LVOT VTI:    0.215 m  AORTA Ao Root diam: 3.20 cm Ao Asc diam:  3.40 cm   SHUNTS Systemic VTI:  0.22 m Systemic Diam: 2.30 cm  Stanly Leavens MD Electronically signed by Stanly  Dontario Evetts MD Signature Date/Time: 05/30/2022/6:22:42 PM    Final          ______________________________________________________________________________________________      HYPERTENSION CONTROL Vitals:   04/30/24 0844 04/30/24 0917 04/30/24 0923  BP: (!) 144/85 (!) 146/86 (!) 146/90    The patient's blood pressure is elevated above target today.  In order to address the patient's elevated BP: A new medication was prescribed today.      Physical Exam:    VS:  BP (!) 146/90 (BP Location: Left Arm, Patient Position: Sitting, Cuff Size: Large)   Pulse 100   Ht 5' 10 (1.778 m)   Wt (!) 334 lb (151.5 kg)   SpO2 98%   BMI 47.92 kg/m    Wt Readings from Last 3 Encounters:  04/30/24 (!) 334 lb (151.5 kg)  04/23/23 (!) 300 lb 0.7 oz (136.1 kg)  05/30/22 300 lb (136.1 kg)    Gen: no distress, morbid obesity   Neck: No JVD,  Ears:  Dempsey Sign Cardiac: No Rubs or Gallops, no Murmur, RRR +2 radial pulses distant heart sounds Respiratory: Clear to auscultation bilaterally, normal effort, normal  respiratory rate GI: Soft, nontender, non-distended  MS: Non pitting edema R leg only edema;  moves all extremities Integument: Skin feels warm Neuro:  At time of evaluation, alert and oriented to person/place/time/situation  Psych: Normal affect, patient feels ok   ASSESSMENT AND PLAN: .    An EKG was ordered for PACs and shows [facility] with PACS but no PVCs  Shortness of breath Chronic shortness of breath, exacerbated by activity and bending over. Likely multifactorial, with contributions from  COPD, suspected obstructive sleep apnea, and morbid obesity. No nocturnal symptoms reported. No chest pain or syncope. Differential includes cardiac and pulmonary causes. - Recommended home sleep study to evaluate for obstructive sleep apnea (STOP BANG 5) - Encouraged gradual increase in physical activity, starting with one minute of exercise per day, increasing weekly. - Discussed potential benefits of weight loss on shortness of breath.  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 Previous episodes of rapid AFib. Current symptoms include palpitations.  - Started bisoprolol 5 mg daily to manage PACs, PVCs, and hypertension. - Ordered non-live heart monitor for two weeks to assess frequency and severity of arrhythmias.  Essential hypertension Persistent hypertension with recent reading of 146/86 mmHg. Current management with lisinopril 30 mg daily. Bisoprolol added to address both hypertension and arrhythmias. - Continue lisinopril 30 mg daily. - Started bisoprolol 5 mg daily to manage hypertension and arrhythmias.  Suspected obstructive sleep apnea Based on STOP-BANG score of 5 and reported gasping during sleep. Potential contributor to fatigue and shortness of breath. - Ordered home sleep study to confirm diagnosis of obstructive sleep apnea.  Chronic obstructive pulmonary disease COPD with previous use of inhalers. No current inhaler use. Symptoms may contribute to shortness of breath. - Recommended follow-up with primary care or pulmonologist for COPD management.  Morbid obesity Contributing to shortness of breath and suspected obstructive sleep apnea. Weight gain noted since 2017. Discussed potential benefits of weight loss on overall health and symptoms. - Encouraged dietary changes and gradual increase in physical activity. - Discussed potential for GLP-1 receptor antagonist therapy if sleep apnea is confirmed and apnea-hypopnea  index is greater than 15.  Spring f/u with me or my team  Stanly Leavens, MD FASE [facility] Cardiologist [facility]  [address] [place], [address] [place], [place] [address] [phone]  9:25  AM

## 2024-04-30 NOTE — Patient Instructions (Addendum)
 Medication Instructions:  Your physician has recommended you make the following change in your medication:  START: Bisoprolol 5 mg once daily  *If you need a refill on your cardiac medications before your next appointment, please call your pharmacy*   Testing/Procedures: WatchPAT? is an FDA-cleared portable home sleep study test that uses a watch and 3 points of contact to monitor 7 different channels, including your heart rate, oxygen saturation, body position, snoring, and chest motion.  The study is easy to use from the comfort of your own home and accurately detect sleep apnea.  Before bed, you attach the chest sensor, attached the sleep apnea bracelet to your nondominant hand, and attach the finger probe.  After the study, the raw data is downloaded from the watch and scored for apnea events.   For more information: [email or web address]  Patient Testing Instructions:  Once our office has received insurance approval for you to complete this test, we will contact you with a PIN to activate the device.  This typically takes 2-3 weeks.  Please do not open the box until approved.   Do not put battery into the device until bedtime when you are ready to begin the test. Please call the support number if you need assistance after following the instructions below: 24 hour support line- [phone] or [facility] support at [phone] (option 2)  Download the [facility] WatchPAT One app through the [employer] or [employer]. Be sure to turn on or enable access to Bluetooth in settings on your smartphone. Make sure no other Bluetooth devices are on and within the vicinity of your smartphone and WatchPAT watch during testing.  Make sure to leave your smart phone plugged in and charging all night.  When ready for bed:  Follow the instructions step by step in the WatchPAT One app to activate the testing device. For additional instructions, including video instruction, visit the  WatchPAT One video on Youtube. You can search for WatchPAT One within Youtube (video is 4 minutes and 18 seconds) or enter: https://[email or web address] Please note: You will be prompted to enter a PIN to connect via Bluetooth when starting the test. The PIN will be assigned to you after insurance has approved the test.  The device is disposable, but it recommended that you retain the device until you receive a call letting you know the study has been received and the results have been interpreted.  We will let you know if the study did not transmit to us  properly after the test is completed. You do not need to call us  to confirm the receipt of the test.  Please complete the test within 48 hours of receiving PIN.   Frequently Asked Questions:  What is Watch PAT One?  A single use, fully disposable home sleep apnea testing device and will not need to be returned after completion.  What are the requirements to use WatchPAT One?  A successful WatchPAT One sleep study requires a WatchPAT One device, your smart phone, WatchPAT One app, your PIN number, and internet access. What type of phone do I need?  You should have a smart phone that uses Android 5.1 and above or any iPhone with IOS 10 and above. How can I download the WatchPAT one app?  Based on your device type search for WatchPAT One app either in [employer] for [employer] or [employer] for [employer]. Where will I get my PIN for the study?  Your PIN will be provided by your  physician's office after insurance has approved the test. This process typically takes 2-3 weeks. It is used for authentication and if you lose/forget your PIN, please reach out to your provider's office.  I do not have internet at home. Can I still complete a WatchPAT One study?  WatchPAT One needs internet connection throughout the night to be able to transmit the sleep data. You can use your home/local internet or your cellular data package. However,  it is always recommended to use home/local internet. It is estimated that between 20MB-30MB of data will be used with each study, but the application will be looking for space in the phone to start the study.  What happens if I lose internet or Bluetooth connection?  During the internet disconnection, your phone will not be able to transmit the sleep data.  All the data, will be stored in your phone.  As soon as the internet connection is back on, the phone will resume sending the sleep data. During the Bluetooth disconnection, WatchPAT One will not be able to to send the sleep data to your phone.  Data will be kept in the WatchPAT One until both devices have Bluetooth connection back on.  As soon as the connection is back on, WatchPAT one will send the sleep data to the phone.  How long do I need to wear the WatchPAT one?  After you start the study, you should wear the device at least 6 hours.  How far should I keep my phone from the device?  During the night, your phone should remain within 15 feet of where you sleep.  What happens if I leave the room for restroom or other reasons?  Leaving the room for any reason will not cause any problem. As soon as your get back to the room, both devices will reconnect and will continue to send the sleep data. Can I use my phone during the sleep study?  Yes, you can use your phone as usual during the study. But it is recommended to put your WatchPAT One on when you are ready to go to bed.  How will I get my study results?  A soon as you completed your study, your sleep data will be sent to the provider. They will then share the results with you when they are ready.  ZIO XT- Long Term Monitor Instructions  Your physician has requested you wear a ZIO patch monitor for 14 days.  This is a single patch monitor. [employer] supplies one patch monitor per enrollment. Additional stickers are not available. Please do not apply patch if you will be having a Nuclear  Stress Test,  Echocardiogram, Cardiac CT, MRI, or Chest Xray during the period you would be wearing the  monitor. The patch cannot be worn during these tests. You cannot remove and re-apply the  ZIO XT patch monitor.  Your ZIO patch monitor will be mailed 3 day USPS to your address on file. It may take 3-5 days  to receive your monitor after you have been enrolled.  Once you have received your monitor, please review the enclosed instructions. Your monitor  has already been registered assigning a specific monitor serial # to you.  Billing and Patient Assistance Program Information  We have supplied [employer] with any of your insurance information on file for billing purposes. [employer] offers a sliding scale Patient Assistance Program for patients that do not have  insurance, or whose insurance does not completely cover the cost of the ZIO  monitor.  You must apply for the Patient Assistance Program to qualify for this discounted rate.  To apply, please call [employer] at [phone], select option 4, select option 2, ask to apply for  Patient Assistance Program. M[place]eth will ask your household income, and how many people  are in your household. They will quote your out-of-pocket cost based on that information.  [employer] will also be able to set up a 23-month, interest-free payment plan if needed.  Applying the monitor   Shave hair from upper left chest.  Hold abrader disc by orange tab. Rub abrader in 40 strokes over the upper left chest as  indicated in your monitor instructions.  Clean area with 4 enclosed alcohol pads. Let dry.  Apply patch as indicated in monitor instructions. Patch will be placed under collarbone on left  side of chest with arrow pointing upward.  Rub patch adhesive wings for 2 minutes. Remove white label marked 1. Remove the white  label marked 2. Rub patch adhesive wings for 2 additional minutes.  While looking in a mirror, press and release button in center of  patch. A small green light will  flash 3-4 times. This will be your only indicator that the monitor has been turned on.  Do not shower for the first 24 hours. You may shower after the first 24 hours.  Press the button if you feel a symptom. You will hear a small click. Record Date, Time and  Symptom in the Patient Logbook.  When you are ready to remove the patch, follow instructions on the last 2 pages of Patient  Logbook. Stick patch monitor onto the last page of Patient Logbook.  Place Patient Logbook in the blue and white box. Use locking tab on box and tape box closed  securely. The blue and white box has prepaid postage on it. Please place it in the mailbox as  soon as possible. Your physician should have your test results approximately 7 days after the  monitor has been mailed back to [facility].  Call [facility] Customer Care at [phone] if you have questions regarding  your ZIO XT patch monitor. Call them immediately if you see an orange light blinking on your  monitor.  If your monitor falls off in less than 4 days, contact our Monitor department at [phone].  If your monitor becomes loose or falls off after 4 days call [employer] at [phone] for  suggestions on securing your monitor  Follow-Up: At [facility], you and your health needs are our priority.  As part of our continuing mission to provide you with exceptional heart care, our providers are all part of one team.  This team includes your primary Cardiologist (physician) and Advanced Practice Providers or APPs (Physician Assistants and Nurse Practitioners) who all work together to provide you with the care you need, when you need it.  Your next appointment:    March 2026  Provider:   One of our Advanced Practice Providers (APPs): Morse Clause, PA-C  Lamarr Satterfield, NP Miriam Shams, NP  Olivia Pavy, PA-C Josefa Beauvais, NP  Leontine Salen, PA-C Orren Fabry, PA-C  Sterling, PA-C Ernest  Dick, NP  Damien Braver, NP Jon Hails, PA-C  Waddell Donath, PA-C    Dayna Dunn, PA-C  Scott Weaver, PA-C Lum Louis, NP Katlyn West, NP Callie Goodrich, PA-C  Xika Zhao, NP Sheng Haley, PA-C    Kathleen Johnson, PA-C   Or Dr. Santo.

## 2024-04-30 NOTE — Progress Notes (Unsigned)
 Enrolled for [employer] to mail a ZIO XT long term holter monitor to the patients address on file.

## 2024-07-07 ENCOUNTER — Ambulatory Visit: Payer: Self-pay | Admitting: Internal Medicine

## 2024-07-07 DIAGNOSIS — I493 Ventricular premature depolarization: Secondary | ICD-10-CM

## 2024-07-07 DIAGNOSIS — I491 Atrial premature depolarization: Secondary | ICD-10-CM | POA: Diagnosis not present

## 2024-08-28 ENCOUNTER — Ambulatory Visit: Admitting: Emergency Medicine
